# Patient Record
Sex: Male | Born: 1950
Health system: Southern US, Community
[De-identification: ages and names within clinical notes are randomized; demographics above are authoritative.]

## PROBLEM LIST (undated history)

## (undated) DIAGNOSIS — F32A Depression, unspecified: Secondary | ICD-10-CM

## (undated) DIAGNOSIS — R011 Cardiac murmur, unspecified: Secondary | ICD-10-CM

## (undated) DIAGNOSIS — I482 Chronic atrial fibrillation, unspecified: Secondary | ICD-10-CM

## (undated) DIAGNOSIS — C4491 Basal cell carcinoma of skin, unspecified: Secondary | ICD-10-CM

## (undated) DIAGNOSIS — R9431 Abnormal electrocardiogram [ECG] [EKG]: Secondary | ICD-10-CM

## (undated) DIAGNOSIS — F329 Major depressive disorder, single episode, unspecified: Secondary | ICD-10-CM

## (undated) DIAGNOSIS — I493 Ventricular premature depolarization: Secondary | ICD-10-CM

## (undated) DIAGNOSIS — I341 Nonrheumatic mitral (valve) prolapse: Secondary | ICD-10-CM

## (undated) DIAGNOSIS — I10 Essential (primary) hypertension: Secondary | ICD-10-CM

## (undated) DIAGNOSIS — D696 Thrombocytopenia, unspecified: Secondary | ICD-10-CM

## (undated) DIAGNOSIS — I340 Nonrheumatic mitral (valve) insufficiency: Secondary | ICD-10-CM

## (undated) DIAGNOSIS — N179 Acute kidney failure, unspecified: Secondary | ICD-10-CM

## (undated) DIAGNOSIS — R42 Dizziness and giddiness: Secondary | ICD-10-CM

## (undated) DIAGNOSIS — E860 Dehydration: Secondary | ICD-10-CM

## (undated) HISTORY — DX: Ventricular premature depolarization: I49.3

## (undated) HISTORY — DX: Cardiac murmur, unspecified: R01.1

## (undated) HISTORY — DX: Abnormal electrocardiogram (ECG) (EKG): R94.31

## (undated) HISTORY — DX: Nonrheumatic mitral (valve) prolapse: I34.1

## (undated) HISTORY — PX: INGUINAL HERNIA REPAIR: SUR1180

## (undated) HISTORY — DX: Nonrheumatic mitral (valve) insufficiency: I34.0

---

## 1998-05-06 ENCOUNTER — Ambulatory Visit (HOSPITAL_COMMUNITY): Admission: RE | Admit: 1998-05-06 | Discharge: 1998-05-06 | Payer: Self-pay | Admitting: Internal Medicine

## 2015-04-23 DIAGNOSIS — Z136 Encounter for screening for cardiovascular disorders: Secondary | ICD-10-CM | POA: Diagnosis not present

## 2015-04-23 DIAGNOSIS — Z23 Encounter for immunization: Secondary | ICD-10-CM | POA: Diagnosis not present

## 2015-04-23 DIAGNOSIS — Z125 Encounter for screening for malignant neoplasm of prostate: Secondary | ICD-10-CM | POA: Diagnosis not present

## 2015-04-23 DIAGNOSIS — R030 Elevated blood-pressure reading, without diagnosis of hypertension: Secondary | ICD-10-CM | POA: Diagnosis not present

## 2015-04-23 DIAGNOSIS — R319 Hematuria, unspecified: Secondary | ICD-10-CM | POA: Diagnosis not present

## 2015-04-23 DIAGNOSIS — Z0000 Encounter for general adult medical examination without abnormal findings: Secondary | ICD-10-CM | POA: Diagnosis not present

## 2015-04-23 DIAGNOSIS — Z131 Encounter for screening for diabetes mellitus: Secondary | ICD-10-CM | POA: Diagnosis not present

## 2015-04-23 DIAGNOSIS — R9431 Abnormal electrocardiogram [ECG] [EKG]: Secondary | ICD-10-CM | POA: Diagnosis not present

## 2015-05-06 ENCOUNTER — Ambulatory Visit: Payer: Self-pay | Admitting: Cardiology

## 2015-05-29 DIAGNOSIS — Z23 Encounter for immunization: Secondary | ICD-10-CM | POA: Diagnosis not present

## 2015-05-29 DIAGNOSIS — R319 Hematuria, unspecified: Secondary | ICD-10-CM | POA: Diagnosis not present

## 2015-06-10 ENCOUNTER — Encounter: Payer: Self-pay | Admitting: Cardiology

## 2015-06-10 ENCOUNTER — Ambulatory Visit (INDEPENDENT_AMBULATORY_CARE_PROVIDER_SITE_OTHER): Payer: Medicare Other | Admitting: Cardiology

## 2015-06-10 VITALS — BP 140/66 | HR 78 | Ht 73.0 in | Wt 156.8 lb

## 2015-06-10 DIAGNOSIS — I10 Essential (primary) hypertension: Secondary | ICD-10-CM | POA: Diagnosis not present

## 2015-06-10 DIAGNOSIS — E785 Hyperlipidemia, unspecified: Secondary | ICD-10-CM | POA: Diagnosis not present

## 2015-06-10 DIAGNOSIS — I340 Nonrheumatic mitral (valve) insufficiency: Secondary | ICD-10-CM

## 2015-06-10 DIAGNOSIS — I341 Nonrheumatic mitral (valve) prolapse: Secondary | ICD-10-CM

## 2015-06-10 DIAGNOSIS — I493 Ventricular premature depolarization: Secondary | ICD-10-CM

## 2015-06-10 DIAGNOSIS — R9431 Abnormal electrocardiogram [ECG] [EKG]: Secondary | ICD-10-CM

## 2015-06-10 DIAGNOSIS — I491 Atrial premature depolarization: Secondary | ICD-10-CM | POA: Diagnosis not present

## 2015-06-10 HISTORY — DX: Abnormal electrocardiogram (ECG) (EKG): R94.31

## 2015-06-10 HISTORY — DX: Nonrheumatic mitral (valve) prolapse: I34.1

## 2015-06-10 HISTORY — DX: Ventricular premature depolarization: I49.3

## 2015-06-10 LAB — CBC WITH DIFFERENTIAL/PLATELET
Basophils Absolute: 0.1 10*3/uL (ref 0.0–0.1)
Basophils Relative: 0.7 % (ref 0.0–3.0)
Eosinophils Absolute: 0.1 10*3/uL (ref 0.0–0.7)
Eosinophils Relative: 1.8 % (ref 0.0–5.0)
HCT: 39.7 % (ref 39.0–52.0)
Hemoglobin: 13.7 g/dL (ref 13.0–17.0)
Lymphocytes Relative: 19.6 % (ref 12.0–46.0)
Lymphs Abs: 1.5 10*3/uL (ref 0.7–4.0)
MCHC: 34.5 g/dL (ref 30.0–36.0)
MCV: 91.8 fl (ref 78.0–100.0)
Monocytes Absolute: 0.4 10*3/uL (ref 0.1–1.0)
Monocytes Relative: 5.2 % (ref 3.0–12.0)
Neutro Abs: 5.5 10*3/uL (ref 1.4–7.7)
Neutrophils Relative %: 72.7 % (ref 43.0–77.0)
Platelets: 91 10*3/uL — ABNORMAL LOW (ref 150.0–400.0)
RBC: 4.32 Mil/uL (ref 4.22–5.81)
RDW: 13 % (ref 11.5–15.5)
WBC: 7.6 10*3/uL (ref 4.0–10.5)

## 2015-06-10 LAB — COMPREHENSIVE METABOLIC PANEL
ALT: 9 U/L (ref 0–53)
AST: 13 U/L (ref 0–37)
Albumin: 4.6 g/dL (ref 3.5–5.2)
Alkaline Phosphatase: 59 U/L (ref 39–117)
BUN: 16 mg/dL (ref 6–23)
CO2: 32 mEq/L (ref 19–32)
Calcium: 9.4 mg/dL (ref 8.4–10.5)
Chloride: 102 mEq/L (ref 96–112)
Creatinine, Ser: 0.74 mg/dL (ref 0.40–1.50)
GFR: 112.62 mL/min (ref >60.00)
Glucose, Bld: 98 mg/dL (ref 70–99)
Potassium: 3.9 mEq/L (ref 3.5–5.1)
Sodium: 139 mEq/L (ref 135–145)
Total Bilirubin: 0.7 mg/dL (ref 0.2–1.2)
Total Protein: 7.9 g/dL (ref 6.0–8.3)

## 2015-06-10 LAB — VITAMIN D 25 HYDROXY (VIT D DEFICIENCY, FRACTURES): VITD: 17.25 ng/mL — ABNORMAL LOW (ref 30.00–100.00)

## 2015-06-10 LAB — VITAMIN B12: Vitamin B-12: 166 pg/mL — ABNORMAL LOW (ref 211–911)

## 2015-06-10 LAB — TSH: TSH: 1.44 u[IU]/mL (ref 0.35–4.50)

## 2015-06-10 LAB — MAGNESIUM: Magnesium: 2.1 mg/dL (ref 1.5–2.5)

## 2015-06-10 NOTE — Patient Instructions (Signed)
Medication Instructions:   Your physician recommends that you continue on your current medications as directed. Please refer to the Current Medication list given to you today.     Labwork:  TODAY----CMET, MAGNESIUM, CBC W DIFF, TSH, VITAMIN D, B 12    Testing/Procedures:  Your physician has requested that you have an echocardiogram. Echocardiography is a painless test that uses sound waves to create images of your heart. It provides your doctor with information about the size and shape of your heart and how well your heart's chambers and valves are working. This procedure takes approximately one hour. There are no restrictions for this procedure.   Your physician has recommended that you wear a 24 HOUR holter monitor. Holter monitors are medical devices that record the heart's electrical activity. Doctors most often use these monitors to diagnose arrhythmias. Arrhythmias are problems with the speed or rhythm of the heartbeat. The monitor is a small, portable device. You can wear one while you do your normal daily activities. This is usually used to diagnose what is causing palpitations/syncope (passing out).    Follow-Up:  2 MONTHS WITH DR Meda Coffee

## 2015-06-10 NOTE — Progress Notes (Signed)
Patient ID: Brian Martinez, male   DOB: 10-04-1950, 64 y.o.   MRN: [id number]      Cardiology Office Note  Date:  06/10/2015   ID:  Brian Martinez, DOB [birth year], MRN [id number]  PCP:  REDMON,NOELLE, PA-C  Cardiologist:  Dorothy Spark, MD   Chief Complaint  Patient presents with  . New Evaluation    abnormal ekg    History of Present Illness: Brian Martinez is a 64 y.o. male who presents for evaluation of an abnormal ECG. The patient has always been fairly healthy, his wife passed away sec to breast ca in December 2015 and he has been depressed, not active, loosing weight. He retired 5 weeks ago. He denies CP or DOE. He used to be very active. He has no children. He is planning to join [employer] and start volunteering in a hospital. He has h/o MV prolapse. No syncope, no LE edema, orthopnea, PND. He occasionally has palpitations, one time lasting 30 minutes and associated with dizziness and presyncope. No syncope. No FH of premature CAD.  Hypertension runs in his family, he has has some elevated BPs lately.  Past Medical History  Diagnosis Date  . Heart murmur   . Heart murmur     Past Surgical History  Procedure Laterality Date  . Inguinal hernia repair       Current Outpatient Prescriptions  Medication Sig Dispense Refill  . aspirin EC 81 MG tablet Take 81 mg by mouth daily.     No current facility-administered medications for this visit.    Allergies:   Review of patient's allergies indicates no known allergies.    Social History:  The patient  reports that he has never smoked. He does not have any smokeless tobacco history on file. He reports that he drinks alcohol.   Family History:  The patient's family history includes Cancer in his brother; Hypertension in his brother, brother, brother, father, and mother; Parkinsonism in his mother.    ROS:  Please see the history of present illness.   Otherwise, review of systems are positive for none.   All  other systems are reviewed and negative.    PHYSICAL EXAM: VS:  BP 140/66 mmHg  Pulse 78  Ht _0  (1.854 m)  Wt 156 lb 12.8 oz (71.124 kg)  BMI 20.69 kg/m2  SpO2 98% , BMI Body mass index is 20.69 kg/(m^2). GEN: Well nourished, well developed, in no acute distress HEENT: normal Neck: no JVD, carotid bruits, or masses Cardiac: RRR; 4/4 late systolic murmur, rubs, or gallops,no edema  Respiratory:  clear to auscultation bilaterally, normal work of breathing GI: soft, nontender, nondistended, + BS MS: no deformity or atrophy Skin: warm and dry, no rash Neuro:  Strength and sensation are intact Psych: euthymic mood, full affect  EKG:  SR, PACs, PVCs,   Recent Labs: No results found for requested labs within last 365 days.   Lipid Panel No results found for: CHOL, TRIG, HDL, CHOLHDL, VLDL, LDLCALC, LDLDIRECT   Wt Readings from Last 3 Encounters:  06/10/15 156 lb 12.8 oz (71.124 kg)      ASSESSMENT AND PLAN:  1. Mitral valve prolapse, mitral regurgitation - we will order echo to evaluate for severity.  2. Palpitations - ECG shows frequent PACs, PVCs, we will order echocardiogram to evaluate for chamber size, especially LA size and evaluate for possible atrial fibrillation. 24 Holter monitor to evaluate for ectopic burden and possible other arrhythmias.  3. Hypertension -  borderline today - at the first visit, w will follow LVH and diastolic function on the echocardiogram.  4. Hyperlipidemia - LDL 129, for now advise to do lifestyle changes, start exercising, TG 101, HDL 51.  Orders Placed This Encounter  Procedures  . Comp Met (CMET)  . Magnesium  . CBC w/Diff  . TSH  . Vitamin D (25 hydroxy)  . B12  . Holter monitor - 24 hour  . EKG 12-Lead  . Echocardiogram   Follow up in 2 months.  Signed, Dorothy Spark, MD  06/10/2015 1:55 PM    [facility] [place], [place], [place]  [address] Phone: [phone]; Fax: [phone]

## 2015-06-11 ENCOUNTER — Telehealth: Payer: Self-pay | Admitting: Cardiology

## 2015-06-11 MED ORDER — VITAMIN D3 125 MCG (5000 UT) PO CAPS: 5000.0000 [IU] | ORAL_CAPSULE | Freq: Every day | ORAL | Status: DC

## 2015-06-11 MED ORDER — VITAMIN B-12 100 MCG PO TABS: 100.0000 ug | ORAL_TABLET | Freq: Every day | ORAL | Status: AC

## 2015-06-11 NOTE — Telephone Encounter (Signed)
-----   Message from Dorothy Spark, MD sent at 06/10/2015  5:41 PM EDT ----- Normal electrolytes, liver, kidney function. Normal thyroid function. Low Vitamin D and B12 vitamin, he should start taking OTC vitamin D and B12 supplements.

## 2015-06-11 NOTE — Telephone Encounter (Signed)
Informed the pt that per Dr Meda Coffee the pts labs showed normal electrolytes, liver and function, and normal thyroid function.  Informed the pt that per Dr Meda Coffee he did have low vitamin D and B12, and he should start taking OTC Vitamin D and B12 supplements.  Clarified doses with our [place] and the pt should take Vit B12 100 mcg daily, and take Vitamin D3 5,000 Units po daily. Confirmed the pharmacy of choice with the pt. Pt verbalized understanding and agrees with this plan.

## 2015-06-11 NOTE — Telephone Encounter (Signed)
Pt calling back for results-pls call [phone]

## 2015-07-07 ENCOUNTER — Other Ambulatory Visit (HOSPITAL_COMMUNITY): Payer: Medicare Other

## 2015-07-14 ENCOUNTER — Other Ambulatory Visit (HOSPITAL_COMMUNITY): Payer: Medicare Other

## 2015-07-16 ENCOUNTER — Other Ambulatory Visit (HOSPITAL_COMMUNITY): Payer: Medicare Other

## 2015-07-29 ENCOUNTER — Other Ambulatory Visit: Payer: Self-pay | Admitting: Cardiology

## 2015-07-29 DIAGNOSIS — R9431 Abnormal electrocardiogram [ECG] [EKG]: Secondary | ICD-10-CM

## 2015-07-29 DIAGNOSIS — I491 Atrial premature depolarization: Secondary | ICD-10-CM

## 2015-07-29 DIAGNOSIS — I341 Nonrheumatic mitral (valve) prolapse: Secondary | ICD-10-CM

## 2015-07-29 DIAGNOSIS — R42 Dizziness and giddiness: Secondary | ICD-10-CM

## 2015-07-29 DIAGNOSIS — I493 Ventricular premature depolarization: Secondary | ICD-10-CM

## 2015-07-29 DIAGNOSIS — I340 Nonrheumatic mitral (valve) insufficiency: Secondary | ICD-10-CM

## 2015-07-30 ENCOUNTER — Telehealth: Payer: Self-pay | Admitting: *Deleted

## 2015-07-30 ENCOUNTER — Ambulatory Visit (INDEPENDENT_AMBULATORY_CARE_PROVIDER_SITE_OTHER): Payer: Medicare Other

## 2015-07-30 ENCOUNTER — Ambulatory Visit (HOSPITAL_COMMUNITY): Payer: Medicare Other | Attending: Cardiology

## 2015-07-30 ENCOUNTER — Other Ambulatory Visit: Payer: Self-pay

## 2015-07-30 DIAGNOSIS — I351 Nonrheumatic aortic (valve) insufficiency: Secondary | ICD-10-CM | POA: Diagnosis not present

## 2015-07-30 DIAGNOSIS — R42 Dizziness and giddiness: Secondary | ICD-10-CM

## 2015-07-30 DIAGNOSIS — I341 Nonrheumatic mitral (valve) prolapse: Secondary | ICD-10-CM

## 2015-07-30 DIAGNOSIS — I340 Nonrheumatic mitral (valve) insufficiency: Secondary | ICD-10-CM

## 2015-07-30 DIAGNOSIS — I493 Ventricular premature depolarization: Secondary | ICD-10-CM

## 2015-07-30 DIAGNOSIS — I491 Atrial premature depolarization: Secondary | ICD-10-CM

## 2015-07-30 DIAGNOSIS — R9431 Abnormal electrocardiogram [ECG] [EKG]: Secondary | ICD-10-CM

## 2015-07-30 DIAGNOSIS — I517 Cardiomegaly: Secondary | ICD-10-CM | POA: Insufficient documentation

## 2015-07-30 HISTORY — DX: Nonrheumatic mitral (valve) insufficiency: I34.0

## 2015-07-30 NOTE — Telephone Encounter (Signed)
-----   Message from Dorothy Spark, MD sent at 07/30/2015  4:32 PM EST ----- Mr Brian Martinez has moderate mitral valve prolapse with partial flail leaflet and severe mitral regurgitation.  Left atrium is severely dilated.  I would recommend that he goes to talk to CT surgery for possible repair.

## 2015-07-30 NOTE — Telephone Encounter (Signed)
Notified the pt that per Dr Meda Coffee his echo showed that he has moderate mitral valve prolapse with partial flail leaflet and severe mitral regurgitation.  Informed the pt that per Dr Meda Coffee his left atrium is severely dilated and she recommends that we refer him to [facility] for surgery for possible repair.  Informed the pt that I will send our [facility] schedulers a message to call the pt back to arrange this referral.  Pt verbalized understanding and agrees with this plan.

## 2015-08-05 ENCOUNTER — Encounter: Payer: Medicare Other | Admitting: Thoracic Surgery (Cardiothoracic Vascular Surgery)

## 2015-08-07 ENCOUNTER — Telehealth: Payer: Self-pay | Admitting: Cardiology

## 2015-08-07 NOTE — Telephone Encounter (Signed)
Returned call.  Advised that he should keep the appt with Dr Roxy Manns on 08-11-15.  He advised that he would call and reschedule with Dr Meda Coffee afterwards.

## 2015-08-07 NOTE — Telephone Encounter (Signed)
Received message from Dr. Meda Coffee that pt can be rescheduled for first available appointment with her.  I spoke with pt and appt made for him to see Dr. Meda Coffee on December 16,2016 at 2:30

## 2015-08-07 NOTE — Telephone Encounter (Signed)
New Message    Pt calling stating that he has an appt on 08/11/15 w/ Dr. Meda Coffee and Dr. Roxy Manns who Dr. Meda Coffee referred him to. Pt wants to know which appt he should keep and which one he should reschedule. Please call back and advise.

## 2015-08-11 ENCOUNTER — Ambulatory Visit: Payer: Medicare Other | Admitting: Cardiology

## 2015-08-11 ENCOUNTER — Institutional Professional Consult (permissible substitution) (INDEPENDENT_AMBULATORY_CARE_PROVIDER_SITE_OTHER): Payer: Medicare Other | Admitting: Thoracic Surgery (Cardiothoracic Vascular Surgery)

## 2015-08-11 ENCOUNTER — Encounter: Payer: Self-pay | Admitting: Thoracic Surgery (Cardiothoracic Vascular Surgery)

## 2015-08-11 VITALS — BP 156/83 | HR 82 | Resp 20 | Ht 73.0 in | Wt 158.0 lb

## 2015-08-11 DIAGNOSIS — I341 Nonrheumatic mitral (valve) prolapse: Secondary | ICD-10-CM | POA: Diagnosis not present

## 2015-08-11 DIAGNOSIS — I340 Nonrheumatic mitral (valve) insufficiency: Secondary | ICD-10-CM | POA: Diagnosis not present

## 2015-08-11 NOTE — Progress Notes (Signed)
[address]       [place] [address]             [phone]     CARDIOTHORACIC SURGERY CONSULTATION REPORT  Referring Provider is Dorothy Spark, MD PCP is REDMON,NOELLE, PA-C  Chief Complaint  Patient presents with  . Mitral Regurgitation    Surgical eval, ECHO 07/30/2015    HPI:  Patient is a 64 year old male with long-standing history of mitral valve prolapse who has been referred for surgical consultation to discuss treatment options for management of severe mitral regurgitation. The patient has remained otherwise healthy and physically active for all of his life. He states that he was first noted to have a heart murmur on physical exam was in 20 years ago. He states that he had an echocardiogram at that time and was told that he had mitral valve prolapse. For a while he was given antibiotic prophylaxis for all dental cleaning and related procedures, but several years ago his primary care physician told him that this was no longer necessary.  He was never formally evaluated by cardiologist until this past September when he was referred for elective consultation because of the presence of a loud murmur on physical exam, irregular pulse and abnormal EKG revealing sinus rhythm with PAC's and PVC's.  He was initially evaluated by Dr. Meda Coffee on 06/10/2015. A transthoracic echocardiogram was performed 07/30/2015 and notable for the presence of mitral valve prolapse with partially flail segment of the posterior leaflet and severe mitral regurgitation. Left ventricular size and systolic function remains normal with ejection fraction estimated 60-65%.  A 24 hour Holter monitor was performed demonstrating infrequent PVCs with 2 brief episodes of supraventricular tachycardia. There were no sustained arrhythmias or pauses. The patient was referred for surgical consultation.  The patient is recently widowed having lost his wife to metastatic breast cancer in December  2015. They have been married for within 25 years and never had any children. The patient currently lives alone locally in [place]. 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  The patient has 3 brothers, 2 of whom live in [place] and 1 in [place]. He is also close with his brother-in-law who lives in [place]. He does not have any family local but he has numerous friends that he remains close with. This past year has been challenging for him but he seems to be managing reasonably well and verbalizes thorough understanding of his underlying stresses. He has been physically active all of his life. He still enjoys playing golf and tennis occasionally. He reports no significant physical limitations. He specifically denies any symptoms of exertional shortness of breath or chest discomfort. He states that he had an episode of tachycardia palpitation with dizziness one year ago when his wife was ill. He has occasional palpitations but no dizzy spells recently. He denies any history of PND, orthopnea, or lower extremity edema. Energy level has been down some, but he has attributed this to depression and the stresses associated with losing his wife.  Past Medical History  Diagnosis Date  . Heart murmur   . Severe mitral regurgitation 07/30/2015  . MVP (mitral valve prolapse) 06/10/2015  . Abnormal EKG 06/10/2015  . PVC (premature ventricular contraction) 06/10/2015    Past Surgical History  Procedure Laterality Date  . Inguinal hernia repair  Family History  Problem Relation Age of Onset  . Hypertension Father   . Cancer Brother   . Hypertension Brother   . Hypertension Mother   . Parkinsonism Mother   . Hypertension Brother   . Hypertension Brother     Social History   Social History  . Marital Status: Married    Spouse Name: N/A  . Number of Children:  N/A  . Years of Education: N/A   Occupational History  . Not on file.   Social History Main Topics  . Smoking status: Never Smoker   . Smokeless tobacco: Not on file  . Alcohol Use: Yes  . Drug Use: Not on file  . Sexual Activity: Not on file   Other Topics Concern  . Not on file   Social History Narrative    Current Outpatient Prescriptions  Medication Sig Dispense Refill  . aspirin EC 81 MG tablet Take 81 mg by mouth daily.    . Cholecalciferol (VITAMIN D3) 5000 UNITS CAPS Take 1 capsule (5,000 Units total) by mouth daily. 30 capsule 3  . vitamin B-12 (CYANOCOBALAMIN) 100 MCG tablet Take 1 tablet (100 mcg total) by mouth daily. 90 tablet 3   No current facility-administered medications for this visit.    No Known Allergies    Review of Systems:   General:  normal appetite, decreased energy, no weight gain, no weight loss, no fever  Cardiac:  no chest pain with exertion, no chest pain at rest, no SOB with exertion, no resting SOB, no PND, no orthopnea, = palpitations, NO arrhythmia, NO atrial fibrillation, NO LE edema, NO dizzy spells, NO syncope  Respiratory:  no shortness of breath, no home oxygen, no productive cough, no dry cough, no bronchitis, no wheezing, no hemoptysis, no asthma, no pain with inspiration or cough, no sleep apnea, no CPAP at night  GI:   no difficulty swallowing, no reflux, no frequent heartburn, no hiatal hernia, no abdominal pain, no constipation, no diarrhea, no hematochezia, no hematemesis, no melena  GU:   no dysuria,  no frequency, no urinary tract infection, + hematuria, NO enlarged prostate, no kidney stones, no kidney disease  Vascular:  no pain suggestive of claudication, no pain in feet, no leg cramps, no varicose veins, no DVT, no non-healing foot ulcer  Neuro:   no stroke, no TIA's, no seizures, no headaches, no temporary blindness one eye,  no slurred speech, no peripheral neuropathy, no chronic pain, no instability of gait, no  memory/cognitive dysfunction  Musculoskeletal: no arthritis, no joint swelling, no myalgias, no difficulty walking, normal mobility   Skin:   no rash, no itching, no skin infections, no pressure sores or ulcerations  Psych:   no anxiety, + depression, no nervousness, + unusual recent stress  Eyes:   no blurry vision, no floaters, no recent vision changes, + wears glasses or contacts  ENT:   no hearing loss, no loose or painful teeth, no dentures, last saw dentist within the past 12 months  Hematologic:  no easy bruising, no abnormal bleeding, no clotting disorder, no frequent epistaxis  Endocrine:  no diabetes, does not check CBG's at home     Physical Exam:   BP 156/83 mmHg  Pulse 82  Resp 20  Ht 6\' 1"  (1.854 m)  Wt 158 lb (71.668 kg)  BMI 20.85 kg/m2  SpO2 98%  General:  Thin,  well-appearing  HEENT:  Unremarkable   Neck:   no JVD, no bruits, no adenopathy   Chest:  clear to auscultation, symmetrical breath sounds, no wheezes, no rhonchi   CV:   RRR, grade IV/VI holosystolic murmur   Abdomen:  soft, non-tender, no masses   Extremities:  warm, well-perfused, pulses palpable, no LE edema  Rectal/GU  Deferred  Neuro:   Grossly non-focal and symmetrical throughout  Skin:   Clean and dry, no rashes, no breakdown   Diagnostic Tests:  Transthoracic Echocardiography  Patient:  Vasco, Bunker MR #:    [id number] Study Date: 07/30/2015 Gender:   M Age:    57 Height:   185.4 cm Weight:   70.8 kg BSA:    1.9 m^2 Pt. Status: Room:  SONOGRAPHER Marygrace Drought, RCS ATTENDING  Ena Dawley, M.D. ORDERING   Ena Dawley, M.D. REFERRING  Ena Dawley, M.D. PERFORMING  [facility], Outpatient  cc:  ------------------------------------------------------------------- LV EF: 60% -  65%  ------------------------------------------------------------------- Indications:   Abnormal EKG  (R94.31).  ------------------------------------------------------------------- Study Conclusions  - Left ventricle: There was moderate concentric hypertrophy. Systolic function was normal. The estimated ejection fraction was in the range of 60% to 65%. The study is not technically sufficient to allow evaluation of LV diastolic function. - Aortic valve: There was trivial regurgitation. - Mitral valve: Moderate prolapse and partially flail leaflet involving the posterior leaflet. There was severe regurgitation directed anteriorly. - Left atrium: The atrium was moderately to severely dilated.  Transthoracic echocardiography. M-mode, complete 2D, spectral Doppler, and color Doppler. Birthdate: Patient birthdate: 1951-02-12. Age: Patient is 64 yr old. Sex: Gender: male. BMI: 20.6 kg/m^2. Blood pressure:   140/66 Patient status: Outpatient. Study date: Study date: 07/30/2015. Study time: 02:08 PM. Location: Echo laboratory.  -------------------------------------------------------------------  ------------------------------------------------------------------- Left ventricle: There was moderate concentric hypertrophy. Systolic function was normal. The estimated ejection fraction was in the range of 60% to 65%. The study is not technically sufficient to allow evaluation of LV diastolic function.  ------------------------------------------------------------------- Aortic valve:  Structurally normal valve.  Cusp separation was normal. Doppler: Transvalvular velocity was within the normal range. There was no stenosis. There was trivial regurgitation.  ------------------------------------------------------------------- Aorta: The aorta was normal, not dilated, and non-diseased.  ------------------------------------------------------------------- Mitral valve: Moderate prolapse and partially flail leaflet involving the posterior leaflet. Doppler: There was  severe regurgitation directed anteriorly.  ------------------------------------------------------------------- Left atrium: The atrium was moderately to severely dilated.  ------------------------------------------------------------------- Right ventricle: The cavity size was normal. Wall thickness was normal. Systolic function was normal.  ------------------------------------------------------------------- Pulmonic valve:  Structurally normal valve.  Cusp separation was normal. Doppler: Transvalvular velocity was within the normal range. There was trivial regurgitation.  ------------------------------------------------------------------- Tricuspid valve:  Structurally normal valve.  Leaflet separation was normal. Doppler: Transvalvular velocity was within the normal range. There was mild regurgitation.  ------------------------------------------------------------------- Pulmonary artery:  Poorly visualized. The main pulmonary artery was normal-sized.  ------------------------------------------------------------------- Right atrium: The atrium was normal in size.  ------------------------------------------------------------------- Pericardium: The pericardium was normal in appearance. There was no pericardial effusion.  ------------------------------------------------------------------- Systemic veins: Inferior vena cava: The vessel was normal in size. The respirophasic diameter changes were in the normal range (= 50%), consistent with normal central venous pressure. Diameter: 19 mm.  ------------------------------------------------------------------- Post procedure conclusions Ascending Aorta:  - The aorta was normal, not dilated, and non-diseased.  ------------------------------------------------------------------- Measurements  IVC                     Value    Reference ID  19  mm    ---------  Left ventricle               Value    Reference LV ID, ED, PLAX chordal       (H)   57.2 mm   43 - 52 LV ID, ES, PLAX chordal           36.6 mm   23 - 38 LV fx shortening, PLAX chordal       36  %   >=29 LV PW thickness, ED             13.4 mm   --------- IVS/LV PW ratio, ED             1.16     <=1.3 Stroke volume, 2D              73  ml   --------- Stroke volume/bsa, 2D            38  ml/m^2 --------- LV ejection fraction, 1-p A4C        66  %   ---------  Ventricular septum             Value    Reference IVS thickness, ED              15.5 mm   ---------  LVOT                    Value    Reference LVOT ID, S                 21  mm   --------- LVOT area                  3.46 cm^2  --------- LVOT peak velocity, S            101  cm/s  --------- LVOT mean velocity, S            73.9 cm/s  --------- LVOT VTI, S                 21.1 cm   --------- LVOT peak gradient, S            4   mm Hg ---------  Aortic valve                Value    Reference Aortic regurg pressure half-time      329  ms   ---------  Aorta                    Value    Reference Aortic root ID, ED             34  mm   ---------  Left atrium                 Value    Reference LA ID, A-P, ES               51  mm   --------- LA ID/bsa, A-P           (H)   2.68 cm/m^2 <=2.2 LA volume, S                106  ml   --------- LA volume/bsa, S              55.8 ml/m^2 --------- LA volume, ES, 1-p A4C  108  ml   --------- LA  volume/bsa, ES, 1-p A4C         56.8 ml/m^2 --------- LA volume, ES, 1-p A2C           95  ml   --------- LA volume/bsa, ES, 1-p A2C         50  ml/m^2 ---------  Pulmonary arteries             Value    Reference PA pressure, S, DP             29  mm Hg <=30  Tricuspid valve               Value    Reference Tricuspid regurg peak velocity       254  cm/s  --------- Tricuspid peak RV-RA gradient        26  mm Hg --------- Tricuspid maximal regurg velocity,     254  cm/s  --------- PISA  Systemic veins               Value    Reference Estimated CVP                3   mm Hg ---------  Right ventricle               Value    Reference RV pressure, S, DP             29  mm Hg <=30 RV s&', lateral, S              14.4 cm/s  ---------  Legend: (L) and (H) mark values outside specified reference range.  ------------------------------------------------------------------- Prepared and Electronically Authenticated by  Candee Furbish, M.D. 2016-11-10T15:47:01   Impression:  Patient has stage C severe asymptomatic primary mitral regurgitation. I have personally reviewed the patient's recent transthoracic echocardiogram. Image quality is good. The patient has an obvious flail segment of the posterior leaflet of the mitral valve with severe (4+) mitral regurgitation. Left ventricular size and systolic function remain normal. There are no other complicating features.  Based upon review of the patient's echocardiogram I feel there is a very high likelihood that his valve should be repairable with anticipation of excellent long-term durable result and very low operative risk. In the absence of significant coronary artery disease he would likely be an excellent candidate for minimally  invasive approach for surgery.   Plan:  The patient was counseled at length regarding the indications, risks and potential benefits of mitral valve repair.  The rationale for elective surgery has been explained, including a comparison between surgery and continued medical therapy with close follow-up.  The likelihood of successful and durable valve repair has been discussed with particular reference to the findings of their recent echocardiogram.  Based upon these findings and previous experience, I have quoted them a greater than 95 percent likelihood of successful valve repair.  Alternative surgical approaches have been discussed including a comparison between conventional sternotomy and minimally-invasive techniques.  Expectations for the patient's postoperative convalescence has been discussed. At this point the patient wants to think over the options further and wait until after the holidays have passed before making a decision about proceeding with elective surgery. Under the circumstances this seems very reasonable. I stressed the importance that he will need to be followed carefully if he decides to hold off on elective surgical intervention for an extended period of time. Once the patient has made his decision  to proceed with surgery he will need to undergo diagnostic cardiac catheterization to rule out the presence of significant artery disease. I have offered to schedule the patient for routine follow-up appointment in 6 months or sooner should he decide that he wishes to proceed with surgery in the near future. All of his questions have been addressed.   I spent in excess of 90 minutes during the conduct of this office consultation and >50% of this time involved direct face-to-face encounter with the patient for counseling and/or coordination of their care.   Valentina Gu. Roxy Manns, MD 08/11/2015 3:51 PM

## 2015-08-11 NOTE — Patient Instructions (Signed)
Be mindful of the development of any symptoms of shortness of breath and/or chest discomfort with exertion and report them to your cardiologist

## 2015-08-12 ENCOUNTER — Encounter: Payer: Medicare Other | Admitting: Thoracic Surgery (Cardiothoracic Vascular Surgery)

## 2015-09-04 ENCOUNTER — Encounter: Payer: Self-pay | Admitting: Cardiology

## 2015-09-04 ENCOUNTER — Ambulatory Visit (INDEPENDENT_AMBULATORY_CARE_PROVIDER_SITE_OTHER): Payer: Medicare Other | Admitting: Cardiology

## 2015-09-04 VITALS — BP 138/82 | HR 92 | Ht 73.0 in | Wt 157.0 lb

## 2015-09-04 DIAGNOSIS — R002 Palpitations: Secondary | ICD-10-CM | POA: Diagnosis not present

## 2015-09-04 DIAGNOSIS — E785 Hyperlipidemia, unspecified: Secondary | ICD-10-CM | POA: Diagnosis not present

## 2015-09-04 DIAGNOSIS — I340 Nonrheumatic mitral (valve) insufficiency: Secondary | ICD-10-CM | POA: Diagnosis not present

## 2015-09-04 DIAGNOSIS — I119 Hypertensive heart disease without heart failure: Secondary | ICD-10-CM | POA: Diagnosis not present

## 2015-09-04 MED ORDER — LOSARTAN POTASSIUM 25 MG PO TABS: 25.0000 mg | ORAL_TABLET | Freq: Every day | ORAL | Status: DC

## 2015-09-04 NOTE — Progress Notes (Signed)
Patient ID: Brian Martinez, male   DOB: 06-04-1951, 64 y.o.   MRN: [id number]      Cardiology Office Note  Date:  09/04/2015   ID:  Brian Martinez, DOB [birth year], MRN [id number]  PCP:  REDMON,NOELLE, PA-C  Cardiologist:  Dorothy Spark, MD   No chief complaint on file.   History of Present Illness: Brian Martinez is a 64 y.o. male who presents for evaluation of an abnormal ECG. The patient has always been fairly healthy, his wife passed away sec to breast ca in December 2015 and he has been depressed, not active, loosing weight. He retired 5 weeks ago. He denies CP or DOE. He used to be very active. He has no children. He is planning to join [employer] and start volunteering in a hospital. He has h/o MV prolapse. No syncope, no LE edema, orthopnea, PND. He occasionally has palpitations, one time lasting 30 minutes and associated with dizziness and presyncope. No syncope. No FH of premature CAD.  Hypertension runs in his family, he has has some elevated BPs lately.  09/04/15 - the patient underwent echocardiography that showed moderate LVH, Moderate prolapse and partially flail leaflet involving the posterior leaflet with severe regurgitation directed anteriorly, moderate to severe left atrial dilatation. He was evaluated by Dr Roxy Manns who suggested minimally invasive MV repair. The patient wants to determine the best timing in order to be able to attend his nephew's wedding. He denies any SOB, CP, palpitations, he exercises and has minimal DOE. No syncope.  Past Medical History  Diagnosis Date  . Heart murmur   . Severe mitral regurgitation 07/30/2015  . MVP (mitral valve prolapse) 06/10/2015  . Abnormal EKG 06/10/2015  . PVC (premature ventricular contraction) 06/10/2015    Past Surgical History  Procedure Laterality Date  . Inguinal hernia repair       Current Outpatient Prescriptions  Medication Sig Dispense Refill  . aspirin EC 81 MG tablet Take 81 mg by mouth daily.     . Cholecalciferol (VITAMIN D3) 5000 UNITS CAPS Take 1 capsule (5,000 Units total) by mouth daily. 30 capsule 3  . vitamin B-12 (CYANOCOBALAMIN) 100 MCG tablet Take 1 tablet (100 mcg total) by mouth daily. 90 tablet 3   No current facility-administered medications for this visit.    Allergies:   Review of patient's allergies indicates no known allergies.    Social History:  The patient  reports that he has never smoked. He does not have any smokeless tobacco history on file. He reports that he drinks alcohol.   Family History:  The patient's family history includes Cancer in his brother; Hypertension in his brother, brother, brother, father, and mother; Parkinsonism in his mother.    ROS:  Please see the history of present illness.   Otherwise, review of systems are positive for none.   All other systems are reviewed and negative.    PHYSICAL EXAM: VS:  There were no vitals taken for this visit. , BMI There is no weight on file to calculate BMI. GEN: Well nourished, well developed, in no acute distress HEENT: normal Neck: no JVD, carotid bruits, or masses Cardiac: RRR; 4/4 late systolic murmur, rubs, or gallops,no edema  Respiratory:  clear to auscultation bilaterally, normal work of breathing GI: soft, nontender, nondistended, + BS MS: no deformity or atrophy Skin: warm and dry, no rash Neuro:  Strength and sensation are intact Psych: euthymic mood, full affect  EKG:  SR, PACs, PVCs,   Recent  Labs: 06/10/2015: ALT 9; BUN 16; Creatinine, Ser 0.74; Hemoglobin 13.7; Magnesium 2.1; Platelets 91.0*; Potassium 3.9; Sodium 139; TSH 1.44   Lipid Panel No results found for: CHOL, TRIG, HDL, CHOLHDL, VLDL, LDLCALC, LDLDIRECT   Wt Readings from Last 3 Encounters:  08/11/15 158 lb (71.668 kg)  06/10/15 156 lb 12.8 oz (71.124 kg)   : TTE: 07/30/2015 Left ventricle: There was moderate concentric hypertrophy. Systolic function was normal. The estimated ejection fraction was in  the range of 60% to 65%. The study is not technically sufficient to allow evaluation of LV diastolic function. - Aortic valve: There was trivial regurgitation. - Mitral valve: Moderate prolapse and partially flail leaflet involving the posterior leaflet. There was severe regurgitation directed anteriorly. - Left atrium: The atrium was moderately to severely dilated.     ASSESSMENT AND PLAN:  1. 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  2. Hypertension - with moderate LVH, and severe MR, we need to do aggressive BP control, he is reluctant, I will prescribe losartan 25 mg po daily, BMP in 1 month.  3. Palpitations - ECG shows frequent PACs, PVCs, there is evere LA dilatation on echo, he is a high risk for developing atrial fibrillation.  24 Holter monitor to evaluate for ectopic burden and possible other arrhythmias.  Infrequent PVCs with no runs.  Two very short episodes of SVT, the longest lasting 7 beats. Benign Holter monitor. No significant arrhythmias or pauses.   4. Hyperlipidemia - LDL 129, for now advise to do lifestyle changes, start exercising, TG 101, HDL 51. Advised to consider red yeast rice.  Follow up in 3 months.  Signed, Dorothy Spark, MD  09/04/2015 2:42 PM    [facility] [address], [place], [place]  [address] Phone: [phone]; Fax: [phone]

## 2015-09-04 NOTE — Patient Instructions (Signed)
Medication Instructions:   START TAKING LOSARTAN 25 MG ONCE DAILY   Labwork:  ONE MONTH TO CHECK A ----BMET   Follow-Up:  3 MONTHS WITH DR Meda Coffee    If you need a refill on your cardiac medications before your next appointment, please call your pharmacy.

## 2015-10-05 ENCOUNTER — Other Ambulatory Visit: Payer: Medicare Other

## 2015-11-17 DIAGNOSIS — L821 Other seborrheic keratosis: Secondary | ICD-10-CM | POA: Diagnosis not present

## 2015-11-17 DIAGNOSIS — L570 Actinic keratosis: Secondary | ICD-10-CM | POA: Diagnosis not present

## 2015-12-03 ENCOUNTER — Inpatient Hospital Stay (HOSPITAL_COMMUNITY)
Admission: EM | Admit: 2015-12-03 | Discharge: 2015-12-10 | DRG: 308 | Disposition: A | Discharge status: Home / Self Care | Payer: Medicare Other | Attending: Internal Medicine | Admitting: Internal Medicine

## 2015-12-03 ENCOUNTER — Emergency Department (HOSPITAL_COMMUNITY): Payer: Medicare Other

## 2015-12-03 ENCOUNTER — Encounter (HOSPITAL_COMMUNITY): Payer: Self-pay

## 2015-12-03 DIAGNOSIS — I4891 Unspecified atrial fibrillation: Secondary | ICD-10-CM | POA: Diagnosis not present

## 2015-12-03 DIAGNOSIS — D473 Essential (hemorrhagic) thrombocythemia: Secondary | ICD-10-CM | POA: Diagnosis not present

## 2015-12-03 DIAGNOSIS — Z7901 Long term (current) use of anticoagulants: Secondary | ICD-10-CM

## 2015-12-03 DIAGNOSIS — E785 Hyperlipidemia, unspecified: Secondary | ICD-10-CM | POA: Diagnosis present

## 2015-12-03 DIAGNOSIS — I493 Ventricular premature depolarization: Secondary | ICD-10-CM | POA: Diagnosis present

## 2015-12-03 DIAGNOSIS — R0789 Other chest pain: Secondary | ICD-10-CM | POA: Diagnosis not present

## 2015-12-03 DIAGNOSIS — Z8249 Family history of ischemic heart disease and other diseases of the circulatory system: Secondary | ICD-10-CM | POA: Diagnosis not present

## 2015-12-03 DIAGNOSIS — I472 Ventricular tachycardia: Secondary | ICD-10-CM | POA: Diagnosis present

## 2015-12-03 DIAGNOSIS — J154 Pneumonia due to other streptococci: Secondary | ICD-10-CM | POA: Diagnosis present

## 2015-12-03 DIAGNOSIS — I119 Hypertensive heart disease without heart failure: Secondary | ICD-10-CM | POA: Diagnosis present

## 2015-12-03 DIAGNOSIS — I340 Nonrheumatic mitral (valve) insufficiency: Secondary | ICD-10-CM | POA: Diagnosis present

## 2015-12-03 DIAGNOSIS — E876 Hypokalemia: Secondary | ICD-10-CM | POA: Diagnosis present

## 2015-12-03 DIAGNOSIS — N179 Acute kidney failure, unspecified: Secondary | ICD-10-CM | POA: Diagnosis not present

## 2015-12-03 DIAGNOSIS — J189 Pneumonia, unspecified organism: Secondary | ICD-10-CM | POA: Diagnosis not present

## 2015-12-03 DIAGNOSIS — I341 Nonrheumatic mitral (valve) prolapse: Secondary | ICD-10-CM | POA: Diagnosis not present

## 2015-12-03 DIAGNOSIS — D7589 Other specified diseases of blood and blood-forming organs: Secondary | ICD-10-CM | POA: Diagnosis present

## 2015-12-03 DIAGNOSIS — R002 Palpitations: Secondary | ICD-10-CM | POA: Diagnosis not present

## 2015-12-03 DIAGNOSIS — I10 Essential (primary) hypertension: Secondary | ICD-10-CM | POA: Insufficient documentation

## 2015-12-03 DIAGNOSIS — D75839 Thrombocytosis, unspecified: Secondary | ICD-10-CM | POA: Diagnosis present

## 2015-12-03 DIAGNOSIS — I511 Rupture of chordae tendineae, not elsewhere classified: Secondary | ICD-10-CM | POA: Diagnosis present

## 2015-12-03 DIAGNOSIS — Z7982 Long term (current) use of aspirin: Secondary | ICD-10-CM

## 2015-12-03 DIAGNOSIS — R918 Other nonspecific abnormal finding of lung field: Secondary | ICD-10-CM | POA: Diagnosis not present

## 2015-12-03 LAB — CBC
HEMATOCRIT: 40 % (ref 39.0–52.0)
HEMOGLOBIN: 13.8 g/dL (ref 13.0–17.0)
MCH: 32 pg (ref 26.0–34.0)
MCHC: 34.5 g/dL (ref 30.0–36.0)
MCV: 92.8 fL (ref 78.0–100.0)
Platelets: 466 10*3/uL — ABNORMAL HIGH (ref 150–400)
RBC: 4.31 MIL/uL (ref 4.22–5.81)
RDW: 12.4 % (ref 11.5–15.5)
WBC: 21.1 10*3/uL — AB (ref 4.0–10.5)

## 2015-12-03 LAB — CBG MONITORING, ED: Glucose-Capillary: 125 mg/dL — ABNORMAL HIGH (ref 65–99)

## 2015-12-03 LAB — URINALYSIS, ROUTINE W REFLEX MICROSCOPIC
Bilirubin Urine: NEGATIVE
Glucose, UA: NEGATIVE mg/dL
KETONES UR: NEGATIVE mg/dL
NITRITE: NEGATIVE
PH: 5.5 (ref 5.0–8.0)
PROTEIN: 30 mg/dL — AB
Specific Gravity, Urine: 1.019 (ref 1.005–1.030)

## 2015-12-03 LAB — URINE MICROSCOPIC-ADD ON

## 2015-12-03 LAB — BASIC METABOLIC PANEL
ANION GAP: 14 (ref 5–15)
BUN: 70 mg/dL — AB (ref 6–20)
CO2: 26 mmol/L (ref 22–32)
Calcium: 9.1 mg/dL (ref 8.9–10.3)
Chloride: 99 mmol/L — ABNORMAL LOW (ref 101–111)
Creatinine, Ser: 1.43 mg/dL — ABNORMAL HIGH (ref 0.61–1.24)
GFR, EST AFRICAN AMERICAN: 57 mL/min — AB (ref >60)
GFR, EST NON AFRICAN AMERICAN: 50 mL/min — AB (ref >60)
Glucose, Bld: 141 mg/dL — ABNORMAL HIGH (ref 65–99)
POTASSIUM: 3.7 mmol/L (ref 3.5–5.1)
SODIUM: 139 mmol/L (ref 135–145)

## 2015-12-03 LAB — I-STAT TROPONIN, ED: TROPONIN I, POC: 0.04 ng/mL (ref 0.00–0.08)

## 2015-12-03 MED ORDER — ACETAMINOPHEN 325 MG PO TABS
650.0000 mg | ORAL_TABLET | ORAL | Status: DC | PRN
Start: 2015-12-03 — End: 2015-12-10

## 2015-12-03 MED ORDER — AZITHROMYCIN 250 MG PO TABS
250.0000 mg | ORAL_TABLET | Freq: Every day | ORAL | Status: AC
  Administered 2015-12-04 – 2015-12-07 (×4): 250 mg via ORAL
  Filled 2015-12-03 (×5): qty 1

## 2015-12-03 MED ORDER — SODIUM CHLORIDE 0.9 % IV BOLUS (SEPSIS)
1000.0000 mL | Freq: Once | INTRAVENOUS | Status: AC
  Administered 2015-12-03: 1000 mL via INTRAVENOUS

## 2015-12-03 MED ORDER — METOPROLOL TARTRATE 1 MG/ML IV SOLN
5.0000 mg | Freq: Once | INTRAVENOUS | Status: AC
  Administered 2015-12-03: 5 mg via INTRAVENOUS
  Filled 2015-12-03: qty 5

## 2015-12-03 MED ORDER — SODIUM CHLORIDE 0.9 % IV SOLN
INTRAVENOUS | Status: DC
  Administered 2015-12-03: 17:00:00 via INTRAVENOUS

## 2015-12-03 MED ORDER — SODIUM CHLORIDE 0.9 % IV BOLUS (SEPSIS)
500.0000 mL | Freq: Once | INTRAVENOUS | Status: AC
  Administered 2015-12-03: 500 mL via INTRAVENOUS

## 2015-12-03 MED ORDER — DEXTROMETHORPHAN POLISTIREX ER 30 MG/5ML PO SUER
30.0000 mg | ORAL | Status: DC | PRN
Start: 2015-12-03 — End: 2015-12-10
  Administered 2015-12-04: 30 mg via ORAL
  Filled 2015-12-03 (×2): qty 5

## 2015-12-03 MED ORDER — DILTIAZEM LOAD VIA INFUSION
10.0000 mg | Freq: Once | INTRAVENOUS | Status: AC
  Administered 2015-12-03: 10 mg via INTRAVENOUS
  Filled 2015-12-03: qty 10

## 2015-12-03 MED ORDER — ASPIRIN EC 81 MG PO TBEC
81.0000 mg | DELAYED_RELEASE_TABLET | Freq: Every day | ORAL | Status: DC
  Administered 2015-12-04 – 2015-12-07 (×4): 81 mg via ORAL
  Filled 2015-12-03 (×4): qty 1

## 2015-12-03 MED ORDER — DEXTROSE 5 % IV SOLN
1.0000 g | Freq: Once | INTRAVENOUS | Status: AC
  Administered 2015-12-03: 1 g via INTRAVENOUS
  Filled 2015-12-03: qty 10

## 2015-12-03 MED ORDER — DEXTROSE 5 % IV SOLN
1.0000 g | INTRAVENOUS | Status: DC
  Administered 2015-12-04 – 2015-12-07 (×4): 1 g via INTRAVENOUS
  Filled 2015-12-03 (×4): qty 10

## 2015-12-03 MED ORDER — VITAMIN B-12 100 MCG PO TABS
100.0000 ug | ORAL_TABLET | Freq: Every day | ORAL | Status: DC
  Administered 2015-12-04 – 2015-12-10 (×7): 100 ug via ORAL
  Filled 2015-12-03 (×8): qty 1

## 2015-12-03 MED ORDER — DILTIAZEM HCL 100 MG IV SOLR
5.0000 mg/h | INTRAVENOUS | Status: DC
  Administered 2015-12-03: 10 mg/h via INTRAVENOUS
  Administered 2015-12-03: 5 mg/h via INTRAVENOUS
  Administered 2015-12-03: 12.5 mg/h via INTRAVENOUS
  Administered 2015-12-04 – 2015-12-05 (×5): 15 mg/h via INTRAVENOUS
  Filled 2015-12-03 (×6): qty 100

## 2015-12-03 MED ORDER — AZITHROMYCIN 250 MG PO TABS
500.0000 mg | ORAL_TABLET | Freq: Every day | ORAL | Status: AC
  Administered 2015-12-03: 500 mg via ORAL
  Filled 2015-12-03: qty 2

## 2015-12-03 MED ORDER — DILTIAZEM LOAD VIA INFUSION
15.0000 mg | Freq: Once | INTRAVENOUS | Status: DC | PRN
  Filled 2015-12-03: qty 15

## 2015-12-03 MED ORDER — ONDANSETRON HCL 4 MG/2ML IJ SOLN: 4.0000 mg | Freq: Four times a day (QID) | INTRAMUSCULAR | Status: DC | PRN

## 2015-12-03 MED ORDER — VITAMIN D 1000 UNITS PO TABS
5000.0000 [IU] | ORAL_TABLET | Freq: Every day | ORAL | Status: DC
  Administered 2015-12-04: 5000 [IU] via ORAL
  Administered 2015-12-05 – 2015-12-06 (×2): 2000 [IU] via ORAL
  Administered 2015-12-08 – 2015-12-10 (×3): 5000 [IU] via ORAL
  Filled 2015-12-03 (×7): qty 5

## 2015-12-03 NOTE — H&P (Signed)
[facility] History and Physical  Brian Martinez [id number] DOB: October 20, 1950 DOA: 12/03/2015  Referring physician: ED physician PCP: Dorothy Spark, MD  Specialists: Dr. Meda Coffee (cardiology), Dr. Roxy Manns (CT surgery)   Chief Complaint:  Heart racing, dyspnea, presyncope   HPI: Brian Martinez is a 65 y.o. male with PMH of hypertension and mitral valve prolapse with flail leaflet and severe mitral regurgitation currently under evaluation by CT surgery and now presents to the ED with persistent palpitations and dyspnea with minimal exertion. Patient reports first noticing palpitations approximately one year ago, but describes this in the setting of severe anxiety related to the passing of his wife last year. He went on to have recurrent, but typically brief episodes of palpitations since that time. While undergoing cardiac workup, TTE revealed a partial flail leaflet with severe mitral regurgitation and he was referred to cardiothoracic surgery for evaluation. Echo also revealed severe dilation of the left atrium, raising concern for development of atrial fibrillation. He wore a Holter monitor for 24 hours and per cardiology notes, there were infrequent PVCs and 2 very short bursts of SVT, the longer of which lasted only 7 beats. Patient was started on losartan for elevated blood pressures and has continued to take a daily aspirin 81 mg. This morning, patient experienced a particularly severe episode of palpitations that persisted throughout the day, ultimately prompting his presentation to the ED. He denies any frank chest pain but does note some episodes of flushing and presyncope that coincide with the palpitations. There is been no lower extremity edema, hemoptysis, or long distance travel. There is no personal or family history of pulmonary embolism and the patient does not smoke or have known cancer.   In ED, patient was found to be afebrile, saturating well on room air, tachycardic to the  160s, and with blood pressure in the 110/80 range. EKG demonstrated atrial fibrillation with rapid ventricular response and LVH by voltage criteria. Chest x-ray features a right lower lobe infiltrate consistent with pneumonia. Blood work is notable for leukocytosis to 21,100 and thrombocytosis of 466,000. Chemistry panel reveals a serum creatinine of 1.43, up from an apparent baseline of 0.7. A 1.5 L bolus of normal saline was  givenin the ED and empiric azithromycin and Rocephin were administered.  There is no change in heart rate after a 5 mg IV push and metoprolol and so Cardizem bolus was administered, followed by infusion. Blood pressure remained stable and heart rate improved only minimally on the diltiazem infusion despite running at a rate of 15 mL/hr. A second bolus was given with diltiazem 15 mg. Patient will be admitted to the stepdown unit for ongoing evaluation and management of new-onset atrial fibrillation with RVR in addition to community-acquired pneumonia.  Where does patient live?   At home    Can patient participate in ADLs?  Yes        Review of Systems:   General: no fevers, chills, sweats, weight change, poor appetite, or fatigue HEENT: no blurry vision, hearing changes or sore throat Pulm: no cough or wheeze. Exertional dyspnea  CV: no chest pain. Palpitations Abd: no nausea, vomiting, abdominal pain, diarrhea, or constipation GU: no dysuria, hematuria, increased urinary frequency, or urgency  Ext: no leg edema Neuro: no focal weakness, numbness, or tingling, no vision change or hearing loss Skin: no rash, no wounds MSK: No muscle spasm, no deformity, no red, hot, or swollen joint Heme: No easy bruising or bleeding Travel history: No recent long distant  travel    Allergy: No Known Allergies  Past Medical History  Diagnosis Date  . Heart murmur   . Severe mitral regurgitation 07/30/2015  . MVP (mitral valve prolapse) 06/10/2015  . Abnormal EKG 06/10/2015  . PVC  (premature ventricular contraction) 06/10/2015    Past Surgical History  Procedure Laterality Date  . Inguinal hernia repair      Social History:  reports that he has never smoked. He does not have any smokeless tobacco history on file. He reports that he drinks alcohol. His drug history is not on file.  Family History:  Family History  Problem Relation Age of Onset  . Hypertension Father   . Cancer Brother   . Hypertension Brother   . Hypertension Mother   . Parkinsonism Mother   . Hypertension Brother   . Hypertension Brother      Prior to Admission medications   Medication Sig Start Date End Date Taking? Authorizing Provider  aspirin EC 81 MG tablet Take 81 mg by mouth daily.   Yes Historical Provider, MD  Cholecalciferol (VITAMIN D3) 5000 UNITS CAPS Take 1 capsule (5,000 Units total) by mouth daily. 06/11/15  Yes Dorothy Spark, MD  dextromethorphan (DELSYM) 30 MG/5ML liquid Take 30 mg by mouth as needed for cough.   Yes Historical Provider, MD  vitamin B-12 (CYANOCOBALAMIN) 100 MCG tablet Take 1 tablet (100 mcg total) by mouth daily. 06/11/15  Yes Dorothy Spark, MD  losartan (COZAAR) 25 MG tablet Take 1 tablet (25 mg total) by mouth daily. Patient not taking: Reported on 12/03/2015 09/04/15   Dorothy Spark, MD    Physical Exam: Filed Vitals:   12/03/15 2148 12/03/15 2219 12/03/15 2230 12/03/15 2300  BP: 110/76 113/83 116/89 111/86  Pulse: 131 115 123 41  Temp:      TempSrc:      Resp: 22 22 24 24   SpO2: 96% 96% 97% 99%   General: Not in acute distress HEENT:       Eyes: PERRL, EOMI, no scleral icterus or conjunctival pallor.       ENT: No discharge from the ears or nose, no pharyngeal ulcers, petechiae or exudate, no tonsillar enlargement.        Neck: No JVD, no bruit, no appreciable mass Heme: No cervical adenopathy, no pallor Cardiac: Rate ~120 and irregular, grade IV holosystolic murmur loudest at apex, No gallops or rubs. Pulm: Good air movement  bilaterally. No rales, wheezing, rhonchi or rubs. Abd: Soft, nondistended, nontender, no rebound pain or gaurding, no mass or organomegaly, BS present. Ext: No LE edema bilaterally. 2+DP/PT pulse bilaterally. Musculoskeletal: No gross deformity, no red, hot, swollen joints  Skin: No rashes or wounds on exposed surfaces  Neuro: Alert, oriented X3, cranial nerves II-XII grossly intact. No focal findings Psych: Patient is not overtly psychotic, appropriate mood and affect.  Labs on Admission:  Basic Metabolic Panel:  Recent Labs Lab 12/03/15 1644  NA 139  K 3.7  CL 99*  CO2 26  GLUCOSE 141*  BUN 70*  CREATININE 1.43*  CALCIUM 9.1   Liver Function Tests: No results for input(s): AST, ALT, ALKPHOS, BILITOT, PROT, ALBUMIN in the last 168 hours. No results for input(s): LIPASE, AMYLASE in the last 168 hours. No results for input(s): AMMONIA in the last 168 hours. CBC:  Recent Labs Lab 12/03/15 1644  WBC 21.1*  HGB 13.8  HCT 40.0  MCV 92.8  PLT 466*   Cardiac Enzymes: No results for input(s): CKTOTAL, CKMB,  CKMBINDEX, TROPONINI in the last 168 hours.  BNP (last 3 results) No results for input(s): BNP in the last 8760 hours.  ProBNP (last 3 results) No results for input(s): PROBNP in the last 8760 hours.  CBG:  Recent Labs Lab 12/03/15 1652  GLUCAP 125*    Radiological Exams on Admission: Dg Chest Port 1 View  12/03/2015  CLINICAL DATA:  Nasal and chest congestion EXAM: PORTABLE CHEST 1 VIEW COMPARISON:  None. FINDINGS: There is mild opacity in the right base compared to the left. No other pulmonary opacities. No pneumothorax. No pulmonary nodules or masses. Mild cardiomegaly. The hila and mediastinum are normal. IMPRESSION: Right lower lobe infiltrate.  Recommend follow-up to resolution. Electronically Signed   By: Dorise Bullion III M.D   On: 12/03/2015 16:39    EKG: Independently reviewed.  Abnormal findings: Atrial fibrillation with RVR (vent rate 161), LVH by  voltage criteria with repol abnormality    Assessment/Plan  1. Atrial fibrillation with RVR  - CHADS-VASc score is 2 for age >65 and HTN  - TTE (07/30/2015) with severe LA dilation, suggesting this will be chronic  - No response to 5 mg IVP metoprolol in ED - Diltiazem bolused and infusion started, titrated up to 15 mL/hr with rate persisting in 120-130, will re-bolus with 15 mg; if this fails, will likely use digoxin next  - Heparin infusion started with pharmacy consultation  - Monitor on telemetry    2. CAP  - RLL infiltrate noted on CXR - Leukocytosis of 21k on admission  - Treated with empiric Rocephin and azithromycin in ED, will continue with this  - Follow-up imaging recommended to exclude underlying mass   3. AKI  - SCr 1.43 on admission, up from 0.74 at last check 6 mos prior  - Could be multifactorial with contribution from acute infectious process, but suspect this is primarily related to RVR and associated reduction in cardiac output - Anticipate improvement with fluid resuscitation and effective HR control  - If fails to improve as expected, may to extend workup with urine studies and renal US    4. Hypertension  - At the lower end of normal currently  - Previously on Losartan, but pt stopped taking  - Monitoring for now, will treat prn    5. Thrombocytosis  - Platelet count 466,000 on admission  - Likely representing an acute phase reactant in setting of PNA and new-onset a fib RVR  - Trend   6. MVP with flail leaflet and severe MR  - Demonstrated on TTE (07/30/15)  - Has consulted with Dr. Roxy Manns of [place] and is planning tentatively for operative repair    DVT ppx:  Heparin infusion per atrial fib dosing   Code Status: Full code Family Communication: None at bed side.               Disposition Plan: Admit to inpatient   Date of Service 12/03/2015    Vianne Bulls, MD [facility] Pager [phone]  If 7PM-7AM, please contact  night-coverage [email or web address] Password [facility] 12/03/2015, 11:54 PM

## 2015-12-03 NOTE — ED Notes (Signed)
Upon performing EKG, Pt's heart rate increased to 150s.

## 2015-12-03 NOTE — ED Notes (Signed)
Pt c/o nasal/chest congestion and scratchy throat x 2 weeks and dizziness/near-syncope and "warmth in forehead" x 2 episodes within past 2 weeks.  Denies pain.  Pt reports that he has been under stress recently and it's been a year since wife's death.  Pt is concerned for anxiety or dehydration.  Recently diagnosed w/ cardiac issues.

## 2015-12-03 NOTE — ED Notes (Signed)
Pt made aware of urine sample needed. 

## 2015-12-03 NOTE — ED Provider Notes (Signed)
CSN: [id number]     Arrival date & time 12/03/15  1536 History   First MD Initiated Contact with Patient 12/03/15 1616     Chief Complaint  Patient presents with  . URI  . Near Syncope     (Consider location/radiation/quality/duration/timing/severity/associated sxs/prior Treatment) HPI   Brian Martinez is a 65 y.o. male who presents for evaluation of periods of palpitation, malaise, dizziness, without near-syncope or syncope, for 1 week. He is worried about "dehydration". He denies nausea, vomiting, anorexia, diarrhea. Is been no fever,  or chest pain. He has a history of mitral regurgitation, and takes aspirin, 81 mg each day. He denies other illnesses. He is able to drive himself here for evaluation and treatment. He has had an occasional cough productive of sputum but feels like it is getting better. He denies fever, chills, dysuria, urinary frequency, abdominal or back pain. There are no other known modifying factors.   Past Medical History  Diagnosis Date  . Heart murmur   . Severe mitral regurgitation 07/30/2015  . MVP (mitral valve prolapse) 06/10/2015  . Abnormal EKG 06/10/2015  . PVC (premature ventricular contraction) 06/10/2015   Past Surgical History  Procedure Laterality Date  . Inguinal hernia repair     Family History  Problem Relation Age of Onset  . Hypertension Father   . Cancer Brother   . Hypertension Brother   . Hypertension Mother   . Parkinsonism Mother   . Hypertension Brother   . Hypertension Brother    Social History  Substance Use Topics  . Smoking status: Never Smoker   . Smokeless tobacco: None  . Alcohol Use: Yes    Review of Systems  All other systems reviewed and are negative.     Allergies  Review of patient's allergies indicates no known allergies.  Home Medications   Prior to Admission medications   Medication Sig Start Date End Date Taking? Authorizing Provider  aspirin EC 81 MG tablet Take 81 mg by mouth daily.   Yes  Historical Provider, MD  Cholecalciferol (VITAMIN D3) 5000 UNITS CAPS Take 1 capsule (5,000 Units total) by mouth daily. 06/11/15  Yes Dorothy Spark, MD  dextromethorphan (DELSYM) 30 MG/5ML liquid Take 30 mg by mouth as needed for cough.   Yes Historical Provider, MD  vitamin B-12 (CYANOCOBALAMIN) 100 MCG tablet Take 1 tablet (100 mcg total) by mouth daily. 06/11/15  Yes Dorothy Spark, MD  losartan (COZAAR) 25 MG tablet Take 1 tablet (25 mg total) by mouth daily. Patient not taking: Reported on 12/03/2015 09/04/15   Dorothy Spark, MD   BP 110/76 mmHg  Pulse 131  Temp(Src) 97.6 F (36.4 C) (Oral)  Resp 22  SpO2 96% Physical Exam  Constitutional: He is oriented to person, place, and time. He appears well-developed and well-nourished.  HENT:  Head: Normocephalic and atraumatic.  Right Ear: External ear normal.  Left Ear: External ear normal.  Eyes: Conjunctivae and EOM are normal. Pupils are equal, round, and reactive to light.  Neck: Normal range of motion and phonation normal. Neck supple.  Cardiovascular: Normal heart sounds.   Irregularly irregular rhythm with tachycardia.  Pulmonary/Chest: Effort normal and breath sounds normal. No respiratory distress. He has no wheezes. He has no rales. He exhibits no bony tenderness.  Abdominal: Soft. There is no tenderness.  Musculoskeletal: Normal range of motion. He exhibits no edema or tenderness.  Neurological: He is alert and oriented to person, place, and time. No cranial nerve deficit or  sensory deficit. He exhibits normal muscle tone. Coordination normal.  Skin: Skin is warm, dry and intact.  Psychiatric: He has a normal mood and affect. His behavior is normal. Judgment and thought content normal.  Nursing note and vitals reviewed.   ED Course  Procedures (including critical care time)  Medications  0.9 %  sodium chloride infusion ( Intravenous New Bag/Given 12/03/15 1659)  diltiazem (CARDIZEM) 1 mg/mL load via infusion 10  mg (10 mg Intravenous Given 12/03/15 1908)    And  diltiazem (CARDIZEM) 100 mg in dextrose 5 % 100 mL (1 mg/mL) infusion (12.5 mg/hr Intravenous New Bag/Given 12/03/15 2152)  cefTRIAXone (ROCEPHIN) 1 g in dextrose 5 % 50 mL IVPB (1 g Intravenous New Bag/Given 12/03/15 2159)  azithromycin (ZITHROMAX) tablet 500 mg (500 mg Oral Given 12/03/15 2152)    Followed by  azithromycin (ZITHROMAX) tablet 250 mg (not administered)  sodium chloride 0.9 % bolus 500 mL (0 mLs Intravenous Stopped 12/03/15 1741)  sodium chloride 0.9 % bolus 1,000 mL (0 mLs Intravenous Stopped 12/03/15 1907)  metoprolol (LOPRESSOR) injection 5 mg (5 mg Intravenous Given 12/03/15 1740)    Patient Vitals for the past 24 hrs:  BP Temp Temp src Pulse Resp SpO2  12/03/15 2148 110/76 mmHg - - (!) 131 22 96 %  12/03/15 2111 125/75 mmHg - - 116 24 96 %  12/03/15 2100 127/100 mmHg - - 68 16 98 %  12/03/15 2032 121/82 mmHg - - 70 24 97 %  12/03/15 2021 109/76 mmHg - - (!) 123 21 97 %  12/03/15 1900 115/97 mmHg - - (!) 127 24 96 %  12/03/15 1830 106/71 mmHg - - 117 19 96 %  12/03/15 1800 106/77 mmHg - - (!) 59 20 (!) 77 %  12/03/15 1730 113/82 mmHg - - 60 23 96 %  12/03/15 1700 100/69 mmHg - - (!) 148 24 95 %  12/03/15 1630 (!) 79/63 mmHg - - 79 21 95 %  12/03/15 1615 118/70 mmHg - - 70 26 95 %  12/03/15 1551 112/92 mmHg 97.6 F (36.4 C) Oral 83 16 95 %    5:51 PM Reevaluation with update and discussion. After initial assessment and treatment, an updated evaluation reveals Transient hypotension improved with IV fluid bolus. Heart rate now 125. After Lopressor IV. He remains irregular, atrial fibrillation. Cardizem bolus and drip noted. Hester Forget L   This patients CHA2DS2-VASc Score and unadjusted Ischemic Stroke Rate (% per year) is equal to 0.6 % stroke rate/year from a score of 1  Above score calculated as 1 point each if present [CHF, HTN, DM, Vascular=MI/PAD/Aortic Plaque, Age if 65-74, or Male] Above score calculated as 2  points each if present [Age > 75, or Stroke/TIA/TE]  9:50 PM-Consult complete with Hospitalist. Patient case explained and discussed. He agrees to admit patient for further evaluation and treatment. Call ended at 2155  CRITICAL CARE Performed by: Richarda Blade Total critical care time: 55 minutes Critical care time was exclusive of separately billable procedures and treating other patients. Critical care was necessary to treat or prevent imminent or life-threatening deterioration. Critical care was time spent personally by me on the following activities: development of treatment plan with patient and/or surrogate as well as nursing, discussions with consultants, evaluation of patient's response to treatment, examination of patient, obtaining history from patient or surrogate, ordering and performing treatments and interventions, ordering and review of laboratory studies, ordering and review of radiographic studies, pulse oximetry and re-evaluation of patient's condition.  Labs Review Labs Reviewed  BASIC METABOLIC PANEL - Abnormal; Notable for the following:    Chloride 99 (*)    Glucose, Bld 141 (*)    BUN 70 (*)    Creatinine, Ser 1.43 (*)    GFR calc non Af Amer 50 (*)    GFR calc Af Amer 57 (*)    All other components within normal limits  CBC - Abnormal; Notable for the following:    WBC 21.1 (*)    Platelets 466 (*)    All other components within normal limits  URINALYSIS, ROUTINE W REFLEX MICROSCOPIC (NOT AT [facility]) - Abnormal; Notable for the following:    Color, Urine AMBER (*)    APPearance CLOUDY (*)    Hgb urine dipstick MODERATE (*)    Protein, ur 30 (*)    Leukocytes, UA SMALL (*)    All other components within normal limits  URINE MICROSCOPIC-ADD ON - Abnormal; Notable for the following:    Squamous Epithelial / LPF 0-5 (*)    Bacteria, UA FEW (*)    Casts HYALINE CASTS (*)    All other components within normal limits  CBG MONITORING, ED - Abnormal; Notable for  the following:    Glucose-Capillary 125 (*)    All other components within normal limits  I-STAT TROPOININ, ED    Imaging Review Dg Chest Port 1 View  12/03/2015  CLINICAL DATA:  Nasal and chest congestion EXAM: PORTABLE CHEST 1 VIEW COMPARISON:  None. FINDINGS: There is mild opacity in the right base compared to the left. No other pulmonary opacities. No pneumothorax. No pulmonary nodules or masses. Mild cardiomegaly. The hila and mediastinum are normal. IMPRESSION: Right lower lobe infiltrate.  Recommend follow-up to resolution. Electronically Signed   By: Dorise Bullion III M.D   On: 12/03/2015 16:39   I have personally reviewed and evaluated these images and lab results as part of my medical decision-making.   EKG Interpretation   Date/Time:  Thursday December 03 2015 16:08:30 EDT Ventricular Rate:  160 PR Interval:    QRS Duration: 102 QT Interval:  286 QTC Calculation: 467 R Axis:   58 Text Interpretation:  Atrial fibrillation with rapid V-rate LVH with  secondary repolarization abnormality ST depression, probably rate related  No old tracing to compare Confirmed by [facility]  MD, Glenda Spelman [phone]) on  12/03/2015 4:19:00 PM      MDM   Final diagnoses:  Atrial fibrillation with RVR ([place])  Community acquired pneumonia    New onset A. fib, RVR with low chads score. Possible community-acquired pneumonia, however , his cough has been improving. He will require admission for further evaluation and treatment.   Nursing Notes Reviewed/ Care Coordinated, and agree without changes. Applicable Imaging Reviewed.  Interpretation of Laboratory Data incorporated into ED treatment  Plan: Admit    Daleen Bo, MD 12/03/15 2201

## 2015-12-04 DIAGNOSIS — I4891 Unspecified atrial fibrillation: Principal | ICD-10-CM

## 2015-12-04 LAB — T4, FREE: FREE T4: 1.4 ng/dL — AB (ref 0.61–1.12)

## 2015-12-04 LAB — CBC WITH DIFFERENTIAL/PLATELET
BASOS PCT: 0 %
Basophils Absolute: 0 10*3/uL (ref 0.0–0.1)
EOS ABS: 0 10*3/uL (ref 0.0–0.7)
Eosinophils Relative: 0 %
HCT: 35 % — ABNORMAL LOW (ref 39.0–52.0)
Hemoglobin: 11.8 g/dL — ABNORMAL LOW (ref 13.0–17.0)
Lymphocytes Relative: 13 %
Lymphs Abs: 1.9 10*3/uL (ref 0.7–4.0)
MCH: 30.4 pg (ref 26.0–34.0)
MCHC: 33.7 g/dL (ref 30.0–36.0)
MCV: 90.2 fL (ref 78.0–100.0)
MONO ABS: 1 10*3/uL (ref 0.1–1.0)
MONOS PCT: 7 %
NEUTROS PCT: 80 %
Neutro Abs: 11.1 10*3/uL — ABNORMAL HIGH (ref 1.7–7.7)
PLATELETS: 298 10*3/uL (ref 150–400)
RBC: 3.88 MIL/uL — ABNORMAL LOW (ref 4.22–5.81)
RDW: 12.5 % (ref 11.5–15.5)
WBC: 14 10*3/uL — ABNORMAL HIGH (ref 4.0–10.5)

## 2015-12-04 LAB — EXPECTORATED SPUTUM ASSESSMENT W REFEX TO RESP CULTURE

## 2015-12-04 LAB — LIPID PANEL
Cholesterol: 138 mg/dL (ref 0–200)
HDL: 24 mg/dL — ABNORMAL LOW (ref >40)
LDL CALC: 95 mg/dL (ref 0–99)
Total CHOL/HDL Ratio: 5.8 RATIO
Triglycerides: 96 mg/dL (ref <150)
VLDL: 19 mg/dL (ref 0–40)

## 2015-12-04 LAB — EXPECTORATED SPUTUM ASSESSMENT W GRAM STAIN, RFLX TO RESP C

## 2015-12-04 LAB — PROCALCITONIN

## 2015-12-04 LAB — BASIC METABOLIC PANEL
Anion gap: 10 (ref 5–15)
BUN: 56 mg/dL — ABNORMAL HIGH (ref 6–20)
CALCIUM: 8.4 mg/dL — AB (ref 8.9–10.3)
CO2: 25 mmol/L (ref 22–32)
CREATININE: 0.92 mg/dL (ref 0.61–1.24)
Chloride: 102 mmol/L (ref 101–111)
GLUCOSE: 113 mg/dL — AB (ref 65–99)
Potassium: 3.6 mmol/L (ref 3.5–5.1)
Sodium: 137 mmol/L (ref 135–145)

## 2015-12-04 LAB — TSH: TSH: 0.387 u[IU]/mL (ref 0.350–4.500)

## 2015-12-04 LAB — PROTIME-INR
INR: 1.25 (ref 0.00–1.49)
PROTHROMBIN TIME: 15.8 s — AB (ref 11.6–15.2)

## 2015-12-04 LAB — MAGNESIUM: MAGNESIUM: 2.2 mg/dL (ref 1.7–2.4)

## 2015-12-04 LAB — MRSA PCR SCREENING: MRSA BY PCR: NEGATIVE

## 2015-12-04 LAB — HEPARIN LEVEL (UNFRACTIONATED)

## 2015-12-04 LAB — APTT: aPTT: 29 seconds (ref 24–37)

## 2015-12-04 MED ORDER — HEPARIN BOLUS VIA INFUSION
2000.0000 [IU] | Freq: Once | INTRAVENOUS | Status: AC
  Administered 2015-12-04: 2000 [IU] via INTRAVENOUS
  Filled 2015-12-04: qty 2000

## 2015-12-04 MED ORDER — GUAIFENESIN ER 600 MG PO TB12
1200.0000 mg | ORAL_TABLET | Freq: Two times a day (BID) | ORAL | Status: DC
Start: 2015-12-04 — End: 2015-12-10
  Administered 2015-12-04 – 2015-12-10 (×8): 1200 mg via ORAL
  Filled 2015-12-04 (×14): qty 2

## 2015-12-04 MED ORDER — PATIENT'S GUIDE TO USING COUMADIN BOOK
Freq: Once | Status: AC
  Administered 2015-12-04: 14:00:00
  Filled 2015-12-04: qty 1

## 2015-12-04 MED ORDER — HEPARIN (PORCINE) IN NACL 100-0.45 UNIT/ML-% IJ SOLN
1000.0000 [IU]/h | INTRAMUSCULAR | Status: DC
  Administered 2015-12-04: 800 [IU]/h via INTRAVENOUS
  Filled 2015-12-04 (×3): qty 250

## 2015-12-04 MED ORDER — HEPARIN (PORCINE) IN NACL 100-0.45 UNIT/ML-% IJ SOLN
1600.0000 [IU]/h | INTRAMUSCULAR | Status: DC
  Administered 2015-12-04: 1250 [IU]/h via INTRAVENOUS
  Administered 2015-12-05: 1550 [IU]/h via INTRAVENOUS
  Administered 2015-12-06 – 2015-12-07 (×3): 1650 [IU]/h via INTRAVENOUS
  Administered 2015-12-08: 1600 [IU]/h via INTRAVENOUS
  Administered 2015-12-08: 1650 [IU]/h via INTRAVENOUS
  Administered 2015-12-09: 1600 [IU]/h via INTRAVENOUS
  Filled 2015-12-04 (×12): qty 250

## 2015-12-04 MED ORDER — WARFARIN - PHARMACIST DOSING INPATIENT
Freq: Every day | Status: DC
  Administered 2015-12-07: 17:00:00

## 2015-12-04 MED ORDER — WARFARIN SODIUM 5 MG PO TABS: 5.0000 mg | ORAL_TABLET | Freq: Once | ORAL | Status: DC

## 2015-12-04 MED ORDER — WARFARIN VIDEO
Freq: Once | Status: AC
  Administered 2015-12-05: 1

## 2015-12-04 MED ORDER — CETYLPYRIDINIUM CHLORIDE 0.05 % MT LIQD
7.0000 mL | Freq: Two times a day (BID) | OROMUCOSAL | Status: DC
  Administered 2015-12-04 – 2015-12-10 (×9): 7 mL via OROMUCOSAL

## 2015-12-04 NOTE — Progress Notes (Signed)
ANTICOAGULATION CONSULT NOTE - Initial Consult  Pharmacy Consult for heparin Indication: atrial fibrillation  No Known Allergies  Patient Measurements: Height: 6\' 1"  (185.4 cm) Weight: 144 lb 6.4 oz (65.5 kg) IBW/kg (Calculated) : 79.9 Heparin Dosing Weight:   Vital Signs: Temp: 98.9 F (37.2 C) (03/17 0400) Temp Source: Oral (03/17 0400) BP: 140/106 mmHg (03/17 0230) Pulse Rate: 102 (03/17 0230)  Labs:  Recent Labs  12/03/15 1644 12/04/15 0001 12/04/15 0312  HGB 13.8  --  11.8*  HCT 40.0  --  35.0*  PLT 466*  --  298  APTT  --  29  --   LABPROT  --  15.8*  --   INR  --  1.25  --   CREATININE 1.43*  --  0.92    Estimated Creatinine Clearance: 73.2 mL/min (by C-G formula based on Cr of 0.92).   Medical History: Past Medical History  Diagnosis Date  . Heart murmur   . Severe mitral regurgitation 07/30/2015  . MVP (mitral valve prolapse) 06/10/2015  . Abnormal EKG 06/10/2015  . PVC (premature ventricular contraction) 06/10/2015    Medications:  Infusions:  . diltiazem (CARDIZEM) infusion 15 mg/hr (12/04/15 0155)  . heparin 800 Units/hr (12/04/15 0050)    Assessment: Patient with new onset afib.  No oral anticoagulants noted on med rec.   Goal of Therapy:  Heparin level 0.3-0.7 units/ml Monitor platelets by anticoagulation protocol: Yes   Plan:  Heparin bolus 2000 units iv x1 Heparin drip at 800 units/hr Daily CBC Next heparin level at [place], [place] 12/04/2015,7:08 AM

## 2015-12-04 NOTE — Progress Notes (Addendum)
ANTICOAGULATION CONSULT NOTE  Pharmacy Consult for Heparin Indication: atrial fibrillation  No Known Allergies  Patient Measurements: Height: 6\' 1"  (185.4 cm) Weight: 144 lb 6.4 oz (65.5 kg) IBW/kg (Calculated) : 79.9 HEPARIN DW (KG): 65.5   Vital Signs: Temp: 97.9 F (36.6 C) (03/17 2000) Temp Source: Oral (03/17 2000) BP: 148/84 mmHg (03/17 2100) Pulse Rate: 78 (03/17 2100)  Labs:  Recent Labs  12/03/15 1644 12/04/15 0001 12/04/15 0312 12/04/15 1001 12/04/15 2045  HGB 13.8  --  11.8*  --   --   HCT 40.0  --  35.0*  --   --   PLT 466*  --  298  --   --   APTT  --  29  --   --   --   LABPROT  --  15.8*  --   --   --   INR  --  1.25  --   --   --   HEPARINUNFRC  --   --   --  <0.10* <0.10*  CREATININE 1.43*  --  0.92  --   --     Estimated Creatinine Clearance: 73.2 mL/min (by C-G formula based on Cr of 0.92).   Medical History: Past Medical History  Diagnosis Date  . Heart murmur   . Severe mitral regurgitation 07/30/2015  . MVP (mitral valve prolapse) 06/10/2015  . Abnormal EKG 06/10/2015  . PVC (premature ventricular contraction) 06/10/2015    Medications:  Infusions:  . diltiazem (CARDIZEM) infusion 15 mg/hr (12/04/15 1900)  . heparin 1,000 Units/hr (12/04/15 1900)    Assessment: Patient with new onset afib.  Heparin level undetectable on increased rate, however still sub-therapeutic.  RN does not relay any issues with pump.  CBC stable.  No bleeding noted.  Not on oral anticoagulant PTA.  Cardiology recommendations for Coumadin noted.  Baseline INR 1.25.   Patient refused to start Coumadin 3/18 until he discussed further with MD.  Patient is on Rocephin/Zithromax for PNA- potential drug-drug interaction with Zithromax could increase INR.     Goal of Therapy:  Heparin level 0.3-0.7 units/ml Monitor platelets by anticoagulation protocol: Yes  INR 2-3   Plan:   Increase heparin gtt to 1400 units/hr  Recheck 6h heparin level  Daily heparin  level, CBC  F/U with patient re: Coumadin.  Coumadin 5mg  po x1 today if patient agreeable.  Daily INR  Netta Cedars, PharmD, BCPS Pager: [phone] 12/05/2015@8 :23 AM   Addendum: Repeat heparin level on 1400 units/hr still sub-therapeutic (0.26).  Will further increase heparin to 1550 units/hr. Recheck 6hr heparin level.  Spoke with patient re: Coumadin and he is ready to initiate therapy today after speaking with MD.  All questions answered.  Proceed with Coumadin orders as above.   Netta Cedars, PharmD, BCPS Pager: [phone] 12/05/2015@4 :28 PM

## 2015-12-04 NOTE — Progress Notes (Signed)
Initial Nutrition Assessment  DOCUMENTATION CODES:   Severe malnutrition in context of chronic illness  INTERVENTION:  -RD to monitor for needs -Pt declined ONS  NUTRITION DIAGNOSIS:   Malnutrition related to chronic illness as evidenced by severe depletion of body fat, severe depletion of muscle mass.  GOAL:   Patient will meet greater than or equal to 90% of their needs  MONITOR:   PO intake, Labs, I & O's, Weight trends  REASON FOR ASSESSMENT:   Malnutrition Screening Tool    ASSESSMENT:   Brian Martinez is a 65 y.o. male with PMH of hypertension and mitral valve prolapse with flail leaflet and severe mitral regurgitation currently under evaluation by CT surgery and now presents to the ED with persistent palpitations and dyspnea with minimal exertion  Spoke with pt at bedside. He admits to poor appetite after his wife passed away a year ago. He dropped from around 170#-140#. Says he recovered and got his weight back up to about 158#, but has been steadily losing for 3 months or so now. Currently exhibits a 13#/8.2% severe wt loss in 3 months.  Within the past week, he states he was eating mostly frozen dinners or fast-food 2-3 times/day. This was with onset of shortness of breath, palpitations. Prior to that, he was cooking regularly.   Suggested boost/ensure to patient, he states he was doing carnation instant breakfast but seemed to experience larger mucosal secretions while drinking it. Explained that milk products may thicken secretions but don't generally increase the amount of secretions. He did not want to risk possible thicker secretions at this time.  Will continue to monitor for needs.  Labs and Medications reviewed.  Diet Order:  Diet Heart Room service appropriate?: Yes; Fluid consistency:: Thin  Skin:  Reviewed, no issues  Last BM:  PTA  Height:   Ht Readings from Last 1 Encounters:  12/04/15 6\' 1"  (1.854 m)    Weight:   Wt Readings from Last 1  Encounters:  12/04/15 144 lb 6.4 oz (65.5 kg)    Ideal Body Weight:  83.63 kg  BMI:  Body mass index is 19.06 kg/(m^2).  Estimated Nutritional Needs:   Kcal:  1650-1950 calories  Protein:  65-75 grams  Fluid:  >/= 1.65L  EDUCATION NEEDS:   No education needs identified at this time  Satira Anis. Shelia Kingsberry, MS, RD LDN After Hours/Weekend Pager [phone]

## 2015-12-04 NOTE — Progress Notes (Signed)
Patient Demographics  Brian Martinez, is a 65 y.o. male, DOB - 02/21/1951, [id number]  Admit date - 12/03/2015   Admitting Physician Vianne Bulls, MD  Outpatient Primary MD for the patient is Dorothy Spark, MD  LOS - 1   Chief Complaint  Patient presents with  . URI  . Near Syncope       Admission HPI/Brief narrative: 65 yo with history of hypertension, severe mitral regurgitation secondary to prolapse, PVCs, Presents with palpitation, dyspnea and presyncope, in ED workup significant for CAP, and new onset A. fib with RVR.  Subjective:   Brian Martinez today has, No headache, No chest pain, No abdominal pain - Reports dyspnea is improving, complaints of cough, and palpitation  Assessment & Plan    Principal Problem:   Atrial fibrillation with RVR ([facility]) Active Problems:   MVP (mitral valve prolapse)   Severe mitral regurgitation   Hyperlipidemia   Hypertensive heart disease   AKI (acute kidney injury) ([place] [address])   CAP (community acquired pneumonia)   Thrombocytosis (HCC)  Atrial fibrillation with RVR  - CHADS-VASc score is 2 for age >46 and HTN  - TTE (07/30/2015) with severe LA dilation, suggesting this will be chronic  - On Cardizem drip, management per cardiology - Monitor on telemetry  - Currently on heparin GTT, pharmacy to dose warfarin, will need to be on warfarin given his MV prolapse.  CAP  - RLL infiltrate noted on CXR - Leukocytosis of 21k on admission , improving, today is 14k. - Continue with Rocephin and azithromycin in ED, will continue with this  - Follow-up imaging recommended to exclude underlying mass   AKI  - SCr 1.43 on admission, up from 0.74 at last check 6 mos prior  - Improving with IV fluids   Hypertension  - At the lower end of normal currently  - Previously on Losartan, but pt stopped taking  - Monitoring for now, will treat prn    Thrombocytosis  - Platelet count 466,000 on admission  - Likely representing an acute phase reactant in setting of PNA and new-onset a fib RVR  - Resolved  MVP with flail leaflet and severe MR  - Demonstrated on TTE (07/30/15)  - Has consulted with Dr. Roxy Manns of [place] and is planning tentatively for operative repair   Code Status: Full  Family Communication: None at bedside  Disposition Plan: Home when stable, remains in stepdown   Procedures  None   Consults   Cardiology   Medications  Scheduled Meds: . antiseptic oral rinse  7 mL Mouth Rinse BID  . aspirin EC  81 mg Oral Daily  . azithromycin  250 mg Oral Daily  . cefTRIAXone (ROCEPHIN)  IV  1 g Intravenous Q24H  . cholecalciferol  5,000 Units Oral Daily  . guaiFENesin  1,200 mg Oral BID  . vitamin B-12  100 mcg Oral Daily   Continuous Infusions: . diltiazem (CARDIZEM) infusion 15 mg/hr (12/04/15 1200)  . heparin 1,000 Units/hr (12/04/15 1135)   PRN Meds:.acetaminophen, dextromethorphan, diltiazem, ondansetron (ZOFRAN) IV  DVT Prophylaxis  Heparin GTT  Lab Results  Component Value Date   PLT 298 12/04/2015    Antibiotics    Anti-infectives    Start  Dose/Rate Route Frequency Ordered Stop   12/04/15 2000  cefTRIAXone (ROCEPHIN) 1 g in dextrose 5 % 50 mL IVPB     1 g 100 mL/hr over 30 Minutes Intravenous Every 24 hours 12/03/15 2353 12/11/15 1959   12/04/15 1000  azithromycin (ZITHROMAX) tablet 250 mg     250 mg Oral Daily 12/03/15 2128 12/08/15 0959   12/03/15 2130  cefTRIAXone (ROCEPHIN) 1 g in dextrose 5 % 50 mL IVPB     1 g 100 mL/hr over 30 Minutes Intravenous  Once 12/03/15 2128 12/03/15 2229   12/03/15 2130  azithromycin (ZITHROMAX) tablet 500 mg     500 mg Oral Daily 12/03/15 2128 12/03/15 2152          Objective:   Filed Vitals:   12/04/15 0900 12/04/15 1000 12/04/15 1100 12/04/15 1200  BP: 133/82 126/89 121/91 149/100  Pulse: 50 134 88 70  Temp:    98.5 F (36.9 C)   TempSrc:    Oral  Resp: 19 23 15 14   Height:      Weight:      SpO2: 96% 95% 98% 97%    Wt Readings from Last 3 Encounters:  12/04/15 65.5 kg (144 lb 6.4 oz)  09/04/15 71.215 kg (157 lb)  08/11/15 71.668 kg (158 lb)     Intake/Output Summary (Last 24 hours) at 12/04/15 1315 Last data filed at 12/04/15 1200  Gross per 24 hour  Intake 1665.16 ml  Output      0 ml  Net 1665.16 ml     Physical Exam  Awake Alert, Oriented X 3, No new F.N deficits, Normal affect Brian Martinez.AT,PERRAL Supple Neck,No JVD, No cervical lymphadenopathy appriciated.  Symmetrical Chest wall movement, Good air movement bilaterally, No wheezing Irregular,No Gallops,Rubs, murmur +, No Parasternal Heave +ve B.Sounds, Abd Soft, No tenderness, No organomegaly appriciated, No rebound - guarding or rigidity. No Cyanosis, Clubbing or edema, No new Rash or bruise     Data Review   Micro Results Recent Results (from the past 240 hour(s))  MRSA PCR Screening     Status: None   Collection Time: 12/04/15  1:49 AM  Result Value Ref Range Status   MRSA by PCR NEGATIVE NEGATIVE Final    Comment:        The GeneXpert MRSA Assay (FDA approved for NASAL specimens only), is one component of a comprehensive MRSA colonization surveillance program. It is not intended to diagnose MRSA infection nor to guide or monitor treatment for MRSA infections.     Radiology Reports Dg Chest Port 1 View  12/03/2015  CLINICAL DATA:  Nasal and chest congestion EXAM: PORTABLE CHEST 1 VIEW COMPARISON:  None. FINDINGS: There is mild opacity in the right base compared to the left. No other pulmonary opacities. No pneumothorax. No pulmonary nodules or masses. Mild cardiomegaly. The hila and mediastinum are normal. IMPRESSION: Right lower lobe infiltrate.  Recommend follow-up to resolution. Electronically Signed   By: Dorise Bullion III M.D   On: 12/03/2015 16:39     CBC  Recent Labs Lab 12/03/15 1644 12/04/15 0312  WBC 21.1* 14.0*   HGB 13.8 11.8*  HCT 40.0 35.0*  PLT 466* 298  MCV 92.8 90.2  MCH 32.0 30.4  MCHC 34.5 33.7  RDW 12.4 12.5  LYMPHSABS  --  1.9  MONOABS  --  1.0  EOSABS  --  0.0  BASOSABS  --  0.0    Chemistries   Recent Labs Lab 12/03/15 1644 12/04/15 0001 12/04/15 [id number]  NA 139  --  137  K 3.7  --  3.6  CL 99*  --  102  CO2 26  --  25  GLUCOSE 141*  --  113*  BUN 70*  --  56*  CREATININE 1.43*  --  0.92  CALCIUM 9.1  --  8.4*  MG  --  2.2  --    ------------------------------------------------------------------------------------------------------------------ estimated creatinine clearance is 73.2 mL/min (by C-G formula based on Cr of 0.92). ------------------------------------------------------------------------------------------------------------------ No results for input(s): HGBA1C in the last 72 hours. ------------------------------------------------------------------------------------------------------------------  Recent Labs  12/04/15 0312  CHOL 138  HDL 24*  LDLCALC 95  TRIG 96  CHOLHDL 5.8   ------------------------------------------------------------------------------------------------------------------  Recent Labs  12/04/15 0001  TSH 0.387   ------------------------------------------------------------------------------------------------------------------ No results for input(s): VITAMINB12, FOLATE, FERRITIN, TIBC, IRON, RETICCTPCT in the last 72 hours.  Coagulation profile  Recent Labs Lab 12/04/15 0001  INR 1.25    No results for input(s): DDIMER in the last 72 hours.  Cardiac Enzymes No results for input(s): CKMB, TROPONINI, MYOGLOBIN in the last 168 hours.  Invalid input(s): CK ------------------------------------------------------------------------------------------------------------------ Invalid input(s): POCBNP     Time Spent in minutes   30 minutes   Sebastion Jun M.D on 12/04/2015 at 1:15 PM  Between 7am to 7pm - Pager -  [phone]  After 7pm go to [email or web address] - password [facility]  [facility]   Office  [phone]

## 2015-12-04 NOTE — Care Management Note (Signed)
Case Management Note  Patient Details  Name: Brian Martinez MRN: [id number] Date of Birth: July 03, 1951  Subjective/Objective:  65 y/o m admitted w/afib. From home.                  Action/Plan:d/c plan home.   Expected Discharge Date:                  Expected Discharge Plan:  Home/Self Care  In-House Referral:     Discharge planning Services  CM Consult  Post Acute Care Choice:    Choice offered to:     DME Arranged:    DME Agency:     HH Arranged:    HH Agency:     Status of Service:  In process, will continue to follow  Medicare Important Message Given:    Date Medicare IM Given:    Medicare IM give by:    Date Additional Medicare IM Given:    Additional Medicare Important Message give by:     If discussed at [place] of Stay Meetings, dates discussed:    Additional Comments:  Dessa Phi, RN 12/04/2015, 3:31 PM

## 2015-12-04 NOTE — Progress Notes (Addendum)
ANTICOAGULATION CONSULT NOTE  Pharmacy Consult for Heparin Indication: atrial fibrillation  No Known Allergies  Patient Measurements: Height: 6\' 1"  (185.4 cm) Weight: 144 lb 6.4 oz (65.5 kg) IBW/kg (Calculated) : 79.9 HEPARIN DW (KG): 65.5   Vital Signs: Temp: 98.2 F (36.8 C) (03/17 0809) Temp Source: Oral (03/17 0809) BP: 126/89 mmHg (03/17 1000) Pulse Rate: 134 (03/17 1000)  Labs:  Recent Labs  12/03/15 1644 12/04/15 0001 12/04/15 0312 12/04/15 1001  HGB 13.8  --  11.8*  --   HCT 40.0  --  35.0*  --   PLT 466*  --  298  --   APTT  --  29  --   --   LABPROT  --  15.8*  --   --   INR  --  1.25  --   --   HEPARINUNFRC  --   --   --  <0.10*  CREATININE 1.43*  --  0.92  --     Estimated Creatinine Clearance: 73.2 mL/min (by C-G formula based on Cr of 0.92).   Medical History: Past Medical History  Diagnosis Date  . Heart murmur   . Severe mitral regurgitation 07/30/2015  . MVP (mitral valve prolapse) 06/10/2015  . Abnormal EKG 06/10/2015  . PVC (premature ventricular contraction) 06/10/2015    Medications:  Infusions:  . diltiazem (CARDIZEM) infusion 15 mg/hr (12/04/15 0155)  . heparin 800 Units/hr (12/04/15 0050)    Assessment: Patient with new onset afib.  Not on oral anticoagulant PTA.   Initial heparin level undetectable.  No bleeding noted.  Cardiology recommendations noted.  Patient will need Coumadin.  Baseline INR 1.25.    Goal of Therapy:  Heparin level 0.3-0.7 units/ml Monitor platelets by anticoagulation protocol: Yes   Plan:  Repeat Heparin bolus 2000 units iv x1 Increase Heparin drip to 1000 units/hr Daily CBC Next heparin level at 2000 Coumadin 5mg  po x1 today Daily INR Initiate Coumadin education  Netta Cedars, PharmD, BCPS Pager: [phone] 12/04/2015,11:32 AM  Addendum:   Educated patient on Coumadin.  He expressed being anxious about starting new medication and would like more time to read over education materials  and discuss with MD prior to starting medication.  He does not wish to receive medication today.  Per patient request, will hold Coumadin dose today and follow-up with patient tomorrow to answer further questions.  Continue IV heparin.   Netta Cedars, PharmD, BCPS Pager: [phone] 12/04/2015@2 :11 PM

## 2015-12-04 NOTE — Consult Note (Signed)
Cardiologist:  Meda Coffee Reason for Consult:  Atrial Fib Referring Physician:   WALFRED BETTENDORF is an 65 y.o. male.  HPI:       The patient is a 65 yo with history of hypertension, severe mitral regurgitation secondary to prolapse, PVCs.  An echocardiogram December 2016 revealing moderate LVH, moderate prolapse and partially flail leaflet involving the posterior leaflet with severe regurgitation directed anteriorly. Moderate to severe left atrial dilation. He was evaluated by Dr. Roxy Manns suggested minimally invasive MV repair.  Or Holter monitor showing infrequent PVCs with no runs. 2 very short episodes of SVT longest lasting 7 beats.  Patient presents with a racing heart dyspnea and presyncope.  He reports feeling different for about a week.  Some dizziness and mild SOB more so in the last two days.  The patient currently denies nausea, vomiting, fever, chest pain, orthopnea, PND, cough, congestion, abdominal pain, hematochezia, melena, lower extremity edema, claudication.  Patient was prescribed losartan 25 mg back in December however he was reluctant to take it and never filled it.  He is supposed to follow up with Dr. Roxy Manns in May regarding the MV repair and with Dr. Meda Coffee in 10 days.     Past Medical History  Diagnosis Date  . Heart murmur   . Severe mitral regurgitation 07/30/2015  . MVP (mitral valve prolapse) 06/10/2015  . Abnormal EKG 06/10/2015  . PVC (premature ventricular contraction) 06/10/2015    Past Surgical History  Procedure Laterality Date  . Inguinal hernia repair      Family History  Problem Relation Age of Onset  . Hypertension Father   . Cancer Brother   . Hypertension Brother   . Hypertension Mother   . Parkinsonism Mother   . Hypertension Brother   . Hypertension Brother     Social History:  reports that he has never smoked. He does not have any smokeless tobacco history on file. He reports that he drinks alcohol. His drug history is not on  file.  Allergies: No Known Allergies  Medications:  Scheduled Meds: . antiseptic oral rinse  7 mL Mouth Rinse BID  . aspirin EC  81 mg Oral Daily  . azithromycin  250 mg Oral Daily  . cefTRIAXone (ROCEPHIN)  IV  1 g Intravenous Q24H  . cholecalciferol  5,000 Units Oral Daily  . vitamin B-12  100 mcg Oral Daily   Continuous Infusions: . diltiazem (CARDIZEM) infusion 15 mg/hr (12/04/15 0155)  . heparin 800 Units/hr (12/04/15 0050)   PRN Meds:.acetaminophen, dextromethorphan, diltiazem, ondansetron (ZOFRAN) IV   Results for orders placed or performed during the hospital encounter of 12/03/15 (from the past 48 hour(s))  Basic metabolic panel     Status: Abnormal   Collection Time: 12/03/15  4:44 PM  Result Value Ref Range   Sodium 139 135 - 145 mmol/L   Potassium 3.7 3.5 - 5.1 mmol/L   Chloride 99 (L) 101 - 111 mmol/L   CO2 26 22 - 32 mmol/L   Glucose, Bld 141 (H) 65 - 99 mg/dL   BUN 70 (H) 6 - 20 mg/dL   Creatinine, Ser 1.43 (H) 0.61 - 1.24 mg/dL   Calcium 9.1 8.9 - 10.3 mg/dL   GFR calc non Af Amer 50 (L) >60 mL/min   GFR calc Af Amer 57 (L) >60 mL/min    Comment: (NOTE) The eGFR has been calculated using the CKD EPI equation. This calculation has not been validated in all clinical situations. eGFR's persistently <60 mL/min  signify possible Chronic Kidney Disease.    Anion gap 14 5 - 15  CBC     Status: Abnormal   Collection Time: 12/03/15  4:44 PM  Result Value Ref Range   WBC 21.1 (H) 4.0 - 10.5 K/uL   RBC 4.31 4.22 - 5.81 MIL/uL   Hemoglobin 13.8 13.0 - 17.0 g/dL   HCT 40.0 39.0 - 52.0 %   MCV 92.8 78.0 - 100.0 fL   MCH 32.0 26.0 - 34.0 pg   MCHC 34.5 30.0 - 36.0 g/dL   RDW 12.4 11.5 - 15.5 %   Platelets 466 (H) 150 - 400 K/uL  CBG monitoring, ED     Status: Abnormal   Collection Time: 12/03/15  4:52 PM  Result Value Ref Range   Glucose-Capillary 125 (H) 65 - 99 mg/dL  I-stat troponin, ED (not at [facility], [facility])     Status: None   Collection Time: 12/03/15  5:01  PM  Result Value Ref Range   Troponin i, poc 0.04 0.00 - 0.08 ng/mL   Comment 3            Comment: Due to the release kinetics of cTnI, a negative result within the first hours of the onset of symptoms does not rule out myocardial infarction with certainty. If myocardial infarction is still suspected, repeat the test at appropriate intervals.   Urinalysis, Routine w reflex microscopic (not at [facility])     Status: Abnormal   Collection Time: 12/03/15  8:21 PM  Result Value Ref Range   Color, Urine AMBER (A) YELLOW    Comment: BIOCHEMICALS MAY BE AFFECTED BY COLOR   APPearance CLOUDY (A) CLEAR   Specific Gravity, Urine 1.019 1.005 - 1.030   pH 5.5 5.0 - 8.0   Glucose, UA NEGATIVE NEGATIVE mg/dL   Hgb urine dipstick MODERATE (A) NEGATIVE   Bilirubin Urine NEGATIVE NEGATIVE   Ketones, ur NEGATIVE NEGATIVE mg/dL   Protein, ur 30 (A) NEGATIVE mg/dL   Nitrite NEGATIVE NEGATIVE   Leukocytes, UA SMALL (A) NEGATIVE  Urine microscopic-add on     Status: Abnormal   Collection Time: 12/03/15  8:21 PM  Result Value Ref Range   Squamous Epithelial / LPF 0-5 (A) NONE SEEN   WBC, UA 6-30 0 - 5 WBC/hpf   RBC / HPF 6-30 0 - 5 RBC/hpf   Bacteria, UA FEW (A) NONE SEEN   Casts HYALINE CASTS (A) NEGATIVE   Urine-Other MUCOUS PRESENT   TSH     Status: None   Collection Time: 12/04/15 12:01 AM  Result Value Ref Range   TSH 0.387 0.350 - 4.500 uIU/mL  T4, free     Status: Abnormal   Collection Time: 12/04/15 12:01 AM  Result Value Ref Range   Free T4 1.40 (H) 0.61 - 1.12 ng/dL    Comment: Performed at [facility]  Magnesium     Status: None   Collection Time: 12/04/15 12:01 AM  Result Value Ref Range   Magnesium 2.2 1.7 - 2.4 mg/dL  Protime-INR     Status: Abnormal   Collection Time: 12/04/15 12:01 AM  Result Value Ref Range   Prothrombin Time 15.8 (H) 11.6 - 15.2 seconds   INR 1.25 0.00 - 1.49  APTT     Status: None   Collection Time: 12/04/15 12:01 AM  Result Value Ref Range    aPTT 29 24 - 37 seconds  Procalcitonin - Baseline     Status: None   Collection Time: 12/04/15  12:01 AM  Result Value Ref Range   Procalcitonin <0.10 ng/mL    Comment:        Interpretation: PCT (Procalcitonin) <= 0.5 ng/mL: Systemic infection (sepsis) is not likely. Local bacterial infection is possible. (NOTE)         ICU PCT Algorithm               Non ICU PCT Algorithm    ----------------------------     ------------------------------         PCT < 0.25 ng/mL                 PCT < 0.1 ng/mL     Stopping of antibiotics            Stopping of antibiotics       strongly encouraged.               strongly encouraged.    ----------------------------     ------------------------------       PCT level decrease by               PCT < 0.25 ng/mL       >= 80% from peak PCT       OR PCT 0.25 - 0.5 ng/mL          Stopping of antibiotics                                             encouraged.     Stopping of antibiotics           encouraged.    ----------------------------     ------------------------------       PCT level decrease by              PCT >= 0.25 ng/mL       < 80% from peak PCT        AND PCT >= 0.5 ng/mL            Continuin g antibiotics                                              encouraged.       Continuing antibiotics            encouraged.    ----------------------------     ------------------------------     PCT level increase compared          PCT > 0.5 ng/mL         with peak PCT AND          PCT >= 0.5 ng/mL             Escalation of antibiotics                                          strongly encouraged.      Escalation of antibiotics        strongly encouraged.   MRSA PCR Screening     Status: None   Collection Time: 12/04/15  1:49 AM  Result Value Ref Range   MRSA by PCR NEGATIVE NEGATIVE    Comment:  The GeneXpert MRSA Assay (FDA approved for NASAL specimens only), is one component of a comprehensive MRSA colonization surveillance program. It  is not intended to diagnose MRSA infection nor to guide or monitor treatment for MRSA infections.   Basic metabolic panel     Status: Abnormal   Collection Time: 12/04/15  3:12 AM  Result Value Ref Range   Sodium 137 135 - 145 mmol/L   Potassium 3.6 3.5 - 5.1 mmol/L   Chloride 102 101 - 111 mmol/L   CO2 25 22 - 32 mmol/L   Glucose, Bld 113 (H) 65 - 99 mg/dL   BUN 56 (H) 6 - 20 mg/dL   Creatinine, Ser 0.92 0.61 - 1.24 mg/dL   Calcium 8.4 (L) 8.9 - 10.3 mg/dL   GFR calc non Af Amer >60 >60 mL/min   GFR calc Af Amer >60 >60 mL/min    Comment: (NOTE) The eGFR has been calculated using the CKD EPI equation. This calculation has not been validated in all clinical situations. eGFR's persistently <60 mL/min signify possible Chronic Kidney Disease.    Anion gap 10 5 - 15  CBC WITH DIFFERENTIAL     Status: Abnormal   Collection Time: 12/04/15  3:12 AM  Result Value Ref Range   WBC 14.0 (H) 4.0 - 10.5 K/uL   RBC 3.88 (L) 4.22 - 5.81 MIL/uL   Hemoglobin 11.8 (L) 13.0 - 17.0 g/dL   HCT 35.0 (L) 39.0 - 52.0 %   MCV 90.2 78.0 - 100.0 fL   MCH 30.4 26.0 - 34.0 pg   MCHC 33.7 30.0 - 36.0 g/dL   RDW 12.5 11.5 - 15.5 %   Platelets 298 150 - 400 K/uL   Neutrophils Relative % 80 %   Neutro Abs 11.1 (H) 1.7 - 7.7 K/uL   Lymphocytes Relative 13 %   Lymphs Abs 1.9 0.7 - 4.0 K/uL   Monocytes Relative 7 %   Monocytes Absolute 1.0 0.1 - 1.0 K/uL   Eosinophils Relative 0 %   Eosinophils Absolute 0.0 0.0 - 0.7 K/uL   Basophils Relative 0 %   Basophils Absolute 0.0 0.0 - 0.1 K/uL  Lipid panel     Status: Abnormal   Collection Time: 12/04/15  3:12 AM  Result Value Ref Range   Cholesterol 138 0 - 200 mg/dL   Triglycerides 96 <150 mg/dL   HDL 24 (L) >40 mg/dL   Total CHOL/HDL Ratio 5.8 RATIO   VLDL 19 0 - 40 mg/dL   LDL Cholesterol 95 0 - 99 mg/dL    Comment:        Total Cholesterol/HDL:CHD Risk Coronary Heart Disease Risk Table                     Men   Women  1/2 Average Risk   3.4    3.3  Average Risk       5.0   4.4  2 X Average Risk   9.6   7.1  3 X Average Risk  23.4   11.0        Use the calculated Patient Ratio above and the CHD Risk Table to determine the patient's CHD Risk.        ATP III CLASSIFICATION (LDL):  <100     mg/dL   Optimal  100-129  mg/dL   Near or Above                    Optimal  130-159  mg/dL   Borderline  160-189  mg/dL   High  >190     mg/dL   Very High Performed at [facility]     Dg Chest Port 1 View  12/03/2015  CLINICAL DATA:  Nasal and chest congestion EXAM: PORTABLE CHEST 1 VIEW COMPARISON:  None. FINDINGS: There is mild opacity in the right base compared to the left. No other pulmonary opacities. No pneumothorax. No pulmonary nodules or masses. Mild cardiomegaly. The hila and mediastinum are normal. IMPRESSION: Right lower lobe infiltrate.  Recommend follow-up to resolution. Electronically Signed   By: Dorise Bullion III M.D   On: 12/03/2015 16:39    Review of Systems  Constitutional: Negative for fever and diaphoresis.  HENT: Negative for sore throat.   Respiratory: Positive for shortness of breath. Negative for cough.   Cardiovascular: Negative for chest pain, palpitations, orthopnea, claudication, leg swelling and PND.  Gastrointestinal: Negative for nausea, vomiting, abdominal pain, blood in stool and melena.  Genitourinary: Negative for hematuria.  Musculoskeletal: Negative for myalgias.  Neurological: Positive for dizziness. Negative for weakness.  All other systems reviewed and are negative.  Blood pressure 120/86, pulse 74, temperature 98.2 F (36.8 C), temperature source Oral, resp. rate 14, height '6\' 1"'$  (1.854 m), weight 144 lb 6.4 oz (65.5 kg), SpO2 97 %. Physical Exam  Nursing note and vitals reviewed.   Well nourished, well developed, in no acute distress HEENT: Pupils are equal round react to light accommodation extraocular movements are intact.  Neck: no JVDNo cervical lymphadenopathy. Cardiac:  Irregular rate and rhythm with 3/6 sys MM at apex and axillae. Lungs:  clear to auscultation bilaterally, no wheezing, rhonchi or rales Abd: soft, nontender, positive bowel sounds all quadrants, no hepatosplenomegaly Ext: no lower extremity edema.  2+ radial and dorsalis pedis pulses. Skin: warm and dry Neuro:  Grossly normal   Assessment/Plan:    Atrial fibrillation with RVR (HCC) On cardizem at '15mg'$ /hr.  Rate 100-115.  IV heparin.  CHADSVASC 2(age, HTN).  Will need to be on coumadin given MV prolapse.  With his severely dilated LA it will make it more difficult to undergo a successful cardioversion.   He may need antiarrhythmic medication.  His EF is normal.  Consider adding Multag before then TEE/DCCV.   BP may tolerate the addition of metoprolol as well.  It may be that his pneumonia was enough to trigger the afib.      MVP (mitral valve prolapse)   Severe mitral regurgitation  Followed by Dr. Roxy Manns.  Appears stable.  I do not think CTS eval is needed now but sooner than May.   Consider MAZE procedure during surgery.   Hyperlipidemia   Hypertensive heart disease   AKI (acute kidney injury) ([place])  resolved   CAP (community acquired pneumonia)   Thrombocytosis ([place])  resolved    HAGER, BRYAN, [place] 12/04/2015, 10:01 AM    Attending Note:   The patient was seen and examined.  Agree with assessment and plan as noted above.  Changes made to the above note as needed.  Pt has a long hx of MVP with severe MR. He is now admitted with pneumonia and atrial fib. Rate is better but still a little fast. He is more short of breath but is very stable  He should be adequately treated for this pneumonia and then get back to Dr. Meda Coffee for his appt. In 10 days. Will need coumadin We can consider cardioversion but I doubt that he will hold sinus  rhythm He needs MV repair with MAZE procedure for definitive treatment .   Thayer Headings, Brooke [place]ito., MD, [facility] 12/04/2015, 11:49 AM [address],  [place] Pager [phone]

## 2015-12-05 LAB — BASIC METABOLIC PANEL
Anion gap: 8 (ref 5–15)
BUN: 38 mg/dL — ABNORMAL HIGH (ref 6–20)
CALCIUM: 8.3 mg/dL — AB (ref 8.9–10.3)
CHLORIDE: 103 mmol/L (ref 101–111)
CO2: 23 mmol/L (ref 22–32)
CREATININE: 0.76 mg/dL (ref 0.61–1.24)
GFR calc Af Amer: >60 mL/min (ref >60)
GFR calc non Af Amer: >60 mL/min (ref >60)
GLUCOSE: 114 mg/dL — AB (ref 65–99)
Potassium: 3.2 mmol/L — ABNORMAL LOW (ref 3.5–5.1)
Sodium: 134 mmol/L — ABNORMAL LOW (ref 135–145)

## 2015-12-05 LAB — PROTIME-INR
INR: 1.16 (ref 0.00–1.49)
Prothrombin Time: 15 seconds (ref 11.6–15.2)

## 2015-12-05 LAB — MAGNESIUM: Magnesium: 1.8 mg/dL (ref 1.7–2.4)

## 2015-12-05 LAB — HIV ANTIBODY (ROUTINE TESTING W REFLEX): HIV Screen 4th Generation wRfx: NONREACTIVE

## 2015-12-05 LAB — STREP PNEUMONIAE URINARY ANTIGEN: Strep Pneumo Urinary Antigen: NEGATIVE

## 2015-12-05 LAB — CBC
HEMATOCRIT: 34.5 % — AB (ref 39.0–52.0)
HEMOGLOBIN: 12 g/dL — AB (ref 13.0–17.0)
MCH: 31.1 pg (ref 26.0–34.0)
MCHC: 34.8 g/dL (ref 30.0–36.0)
MCV: 89.4 fL (ref 78.0–100.0)
Platelets: 313 10*3/uL (ref 150–400)
RBC: 3.86 MIL/uL — ABNORMAL LOW (ref 4.22–5.81)
RDW: 12.4 % (ref 11.5–15.5)
WBC: 15.2 10*3/uL — ABNORMAL HIGH (ref 4.0–10.5)

## 2015-12-05 LAB — PROCALCITONIN: Procalcitonin: <0.1 ng/mL

## 2015-12-05 LAB — HEPARIN LEVEL (UNFRACTIONATED)
HEPARIN UNFRACTIONATED: 0.26 [IU]/mL — AB (ref 0.30–0.70)
HEPARIN UNFRACTIONATED: 0.29 [IU]/mL — AB (ref 0.30–0.70)
Heparin Unfractionated: 0.22 IU/mL — ABNORMAL LOW (ref 0.30–0.70)

## 2015-12-05 MED ORDER — DILTIAZEM HCL 60 MG PO TABS
60.0000 mg | ORAL_TABLET | Freq: Four times a day (QID) | ORAL | Status: DC
  Administered 2015-12-05 – 2015-12-06 (×5): 60 mg via ORAL
  Filled 2015-12-05 (×5): qty 1

## 2015-12-05 MED ORDER — WARFARIN SODIUM 5 MG PO TABS
5.0000 mg | ORAL_TABLET | Freq: Once | ORAL | Status: AC
  Administered 2015-12-05: 5 mg via ORAL
  Filled 2015-12-05: qty 1

## 2015-12-05 MED ORDER — MAGNESIUM SULFATE IN D5W 10-5 MG/ML-% IV SOLN
1.0000 g | Freq: Once | INTRAVENOUS | Status: AC
  Administered 2015-12-05: 1 g via INTRAVENOUS
  Filled 2015-12-05: qty 100

## 2015-12-05 MED ORDER — POTASSIUM CHLORIDE CRYS ER 20 MEQ PO TBCR
40.0000 meq | EXTENDED_RELEASE_TABLET | ORAL | Status: AC
  Administered 2015-12-05 (×2): 40 meq via ORAL
  Filled 2015-12-05 (×2): qty 2

## 2015-12-05 NOTE — Plan of Care (Signed)
Problem: Health Behavior/Discharge Planning: Goal: Ability to manage health-related needs will improve Outcome: Progressing Patient is more opening to discussing medications to treat A-fib. He is reading the booklet on coumadin provided by pharmacy.  Problem: Activity: Goal: Risk for activity intolerance will decrease Outcome: Not Progressing Patient is dizzy upon standing. Will continue to try to mobilize him.  Problem: Education: Goal: Knowledge of disease or condition will improve Outcome: Progressing RN discussed purpose of blood thinners with patient.

## 2015-12-05 NOTE — Progress Notes (Signed)
SUBJECTIVE:  He is somewhat fatigued this morning.     PHYSICAL EXAM Filed Vitals:   12/05/15 0200 12/05/15 0300 12/05/15 0400 12/05/15 0600  BP: 144/75 154/97 129/82 116/98  Pulse: 108 125  100  Temp:   98.4 F (36.9 C)   TempSrc:      Resp: 22 24 19 20   Height:      Weight:   149 lb 11.1 oz (67.9 kg)   SpO2: 96% 97% 95% 96%   General:  No distress Lungs:  Clear Heart:  Irregular, 3/6 systolic murmur Abdomen:  Positive bowel sounds, no rebound no guarding Extremities:  No edema  LABS: No results found for: TROPONINI Results for orders placed or performed during the hospital encounter of 12/03/15 (from the past 24 hour(s))  Heparin level (unfractionated)     Status: Abnormal   Collection Time: 12/04/15 10:01 AM  Result Value Ref Range   Heparin Unfractionated <0.10 (L) 0.30 - 0.70 IU/mL  Culture, expectorated sputum-assessment     Status: None   Collection Time: 12/04/15 10:45 AM  Result Value Ref Range   Specimen Description SPUTUM    Special Requests NONE    Sputum evaluation      THIS SPECIMEN IS ACCEPTABLE. RESPIRATORY CULTURE REPORT TO FOLLOW.   Report Status 12/04/2015 FINAL   Heparin level (unfractionated)     Status: Abnormal   Collection Time: 12/04/15  8:45 PM  Result Value Ref Range   Heparin Unfractionated <0.10 (L) 0.30 - 0.70 IU/mL  Procalcitonin     Status: None   Collection Time: 12/05/15  3:56 AM  Result Value Ref Range   Procalcitonin <0.10 ng/mL  CBC     Status: Abnormal   Collection Time: 12/05/15  3:56 AM  Result Value Ref Range   WBC 15.2 (H) 4.0 - 10.5 K/uL   RBC 3.86 (L) 4.22 - 5.81 MIL/uL   Hemoglobin 12.0 (L) 13.0 - 17.0 g/dL   HCT 34.5 (L) 39.0 - 52.0 %   MCV 89.4 78.0 - 100.0 fL   MCH 31.1 26.0 - 34.0 pg   MCHC 34.8 30.0 - 36.0 g/dL   RDW 12.4 11.5 - 15.5 %   Platelets 313 150 - 400 K/uL  Basic metabolic panel     Status: Abnormal   Collection Time: 12/05/15  3:56 AM  Result Value Ref Range   Sodium 134 (L) 135 - 145 mmol/L    Potassium 3.2 (L) 3.5 - 5.1 mmol/L   Chloride 103 101 - 111 mmol/L   CO2 23 22 - 32 mmol/L   Glucose, Bld 114 (H) 65 - 99 mg/dL   BUN 38 (H) 6 - 20 mg/dL   Creatinine, Ser 0.76 0.61 - 1.24 mg/dL   Calcium 8.3 (L) 8.9 - 10.3 mg/dL   GFR calc non Af Amer >60 >60 mL/min   GFR calc Af Amer >60 >60 mL/min   Anion gap 8 5 - 15  Magnesium     Status: None   Collection Time: 12/05/15  3:56 AM  Result Value Ref Range   Magnesium 1.8 1.7 - 2.4 mg/dL  Protime-INR     Status: None   Collection Time: 12/05/15  3:56 AM  Result Value Ref Range   Prothrombin Time 15.0 11.6 - 15.2 seconds   INR 1.16 0.00 - 1.49    Intake/Output Summary (Last 24 hours) at 12/05/15 0725 Last data filed at 12/05/15 0600  Gross per 24 hour  Intake 683.91 ml  Output  1350 ml  Net -666.09 ml     ASSESSMENT AND PLAN:  MVP/MR:    Plan repair in the future.    ATRIAL FIB:   Continue heparin.  On warfarin per pharmacy.   He is off of IV Dilt.  I will change to PO.    PNEUMONIA:  Per primary team  Minus Breeding 12/05/2015 7:25 AM

## 2015-12-05 NOTE — Progress Notes (Signed)
Patient Demographics  Brian Martinez, is a 65 y.o. male, DOB - [birth year], [id number]  Admit date - 12/03/2015   Admitting Physician Vianne Bulls, MD  Outpatient Primary MD for the patient is Dorothy Spark, MD  LOS - 2   Chief Complaint  Patient presents with  . URI  . Near Syncope       Admission HPI/Brief narrative: 65 yo with history of hypertension, severe mitral regurgitation secondary to prolapse, PVCs, Presents with palpitation, dyspnea and presyncope, in ED workup significant for CAP, and new onset A. fib with RVR.   Subjective:   Brian Martinez today has, No headache, No chest pain, No abdominal pain - Reports dyspnea is improving, Cough is improving, currently dry.  Assessment & Plan    Principal Problem:   Atrial fibrillation with RVR (HCC) Active Problems:   MVP (mitral valve prolapse)   Severe mitral regurgitation   Hyperlipidemia   Hypertensive heart disease   AKI (acute kidney injury) ([place])   CAP (community acquired pneumonia)   Thrombocytosis (HCC)  Atrial fibrillation with RVR  - CHADS-VASc score is 2 for age >40 and HTN  - TTE (07/30/2015) with severe LA dilation, suggesting this it was  chronic  - Initially on Cardizem drip, currently transitioned to by mouth Cardizem, management per cardiology - Monitor on telemetry  - Currently on heparin GTT, pharmacy to dose warfarin, will need to be on warfarin given his MV prolapse.  CAP  - RLL infiltrate noted on CXR - Leukocytosis of 21k on admission , improving. - Continue with Rocephin and azithromycin . - Follow-up imaging recommended to exclude underlying mass   AKI  - SCr 1.43 on admission, up from 0.74 at last check 6 months  prior  - Improving with IV fluids   Hypertension  - At the lower end of normal currently  - Previously on Losartan, but pt stopped taking  - Monitoring for now, will  treat prn   Thrombocytosis  - Platelet count 466,000 on admission  - Likely representing an acute phase reactant in setting of PNA and new-onset a fib RVR  - Resolved  MVP with flail leaflet and severe MR  - Demonstrated on TTE (07/30/15)  - Has consulted with Dr. Roxy Manns of [place] and is planning tentatively for operative repair   Code Status: Full  Family Communication: None at bedside  Disposition Plan: Hopefully can transfer to telemetry heart rate remains controlled.   Procedures  None   Consults   Cardiology   Medications  Scheduled Meds: . antiseptic oral rinse  7 mL Mouth Rinse BID  . aspirin EC  81 mg Oral Daily  . azithromycin  250 mg Oral Daily  . cefTRIAXone (ROCEPHIN)  IV  1 g Intravenous Q24H  . cholecalciferol  5,000 Units Oral Daily  . diltiazem  60 mg Oral 4 times per day  . guaiFENesin  1,200 mg Oral BID  . potassium chloride  40 mEq Oral Q4H  . vitamin B-12  100 mcg Oral Daily  . warfarin  5 mg Oral ONCE-1800  . warfarin   Does not apply Once  . Warfarin - Pharmacist Dosing Inpatient   Does not apply q1800   Continuous Infusions: . heparin 1,400  Units/hr (12/05/15 0839)   PRN Meds:.acetaminophen, dextromethorphan, diltiazem, ondansetron (ZOFRAN) IV  DVT Prophylaxis  Heparin GTT  Lab Results  Component Value Date   PLT 313 12/05/2015    Antibiotics    Anti-infectives    Start     Dose/Rate Route Frequency Ordered Stop   12/04/15 2000  cefTRIAXone (ROCEPHIN) 1 g in dextrose 5 % 50 mL IVPB     1 g 100 mL/hr over 30 Minutes Intravenous Every 24 hours 12/03/15 2353 12/11/15 1959   12/04/15 1000  azithromycin (ZITHROMAX) tablet 250 mg     250 mg Oral Daily 12/03/15 2128 12/08/15 0959   12/03/15 2130  cefTRIAXone (ROCEPHIN) 1 g in dextrose 5 % 50 mL IVPB     1 g 100 mL/hr over 30 Minutes Intravenous  Once 12/03/15 2128 12/03/15 2229   12/03/15 2130  azithromycin (ZITHROMAX) tablet 500 mg     500 mg Oral Daily 12/03/15 2128 12/03/15 2152           Objective:   Filed Vitals:   12/05/15 0400 12/05/15 0600 12/05/15 0800 12/05/15 0837  BP: 129/82 116/98  145/90  Pulse:  100    Temp: 98.4 F (36.9 C)  97.7 F (36.5 C)   TempSrc:   Oral   Resp: 19 20    Height:      Weight: 67.9 kg (149 lb 11.1 oz)     SpO2: 95% 96%      Wt Readings from Last 3 Encounters:  12/05/15 67.9 kg (149 lb 11.1 oz)  09/04/15 71.215 kg (157 lb)  08/11/15 71.668 kg (158 lb)     Intake/Output Summary (Last 24 hours) at 12/05/15 1146 Last data filed at 12/05/15 0600  Gross per 24 hour  Intake 542.58 ml  Output   1350 ml  Net -807.42 ml     Physical Exam  Awake Alert, Oriented X 3, No new F.N deficits, Normal affect [place].AT,PERRAL Supple Neck,No JVD, No cervical lymphadenopathy appriciated.  Symmetrical Chest wall movement, Good air movement bilaterally, No wheezing Irregular,No Gallops,Rubs, murmur +, No Parasternal Heave +ve B.Sounds, Abd Soft, No tenderness, No organomegaly appriciated, No rebound - guarding or rigidity. No Cyanosis, Clubbing or edema, No new Rash or bruise     Data Review   Micro Results Recent Results (from the past 240 hour(s))  MRSA PCR Screening     Status: None   Collection Time: 12/04/15  1:49 AM  Result Value Ref Range Status   MRSA by PCR NEGATIVE NEGATIVE Final    Comment:        The GeneXpert MRSA Assay (FDA approved for NASAL specimens only), is one component of a comprehensive MRSA colonization surveillance program. It is not intended to diagnose MRSA infection nor to guide or monitor treatment for MRSA infections.   Culture, expectorated sputum-assessment     Status: None   Collection Time: 12/04/15 10:45 AM  Result Value Ref Range Status   Specimen Description SPUTUM  Final   Special Requests NONE  Final   Sputum evaluation   Final    THIS SPECIMEN IS ACCEPTABLE. RESPIRATORY CULTURE REPORT TO FOLLOW.   Report Status 12/04/2015 FINAL  Final    Radiology Reports Dg Chest Port  1 View  12/03/2015  CLINICAL DATA:  Nasal and chest congestion EXAM: PORTABLE CHEST 1 VIEW COMPARISON:  None. FINDINGS: There is mild opacity in the right base compared to the left. No other pulmonary opacities. No pneumothorax. No pulmonary nodules or masses. Mild cardiomegaly.  The hila and mediastinum are normal. IMPRESSION: Right lower lobe infiltrate.  Recommend follow-up to resolution. Electronically Signed   By: Dorise Bullion III M.D   On: 12/03/2015 16:39     CBC  Recent Labs Lab 12/03/15 1644 12/04/15 0312 12/05/15 0356  WBC 21.1* 14.0* 15.2*  HGB 13.8 11.8* 12.0*  HCT 40.0 35.0* 34.5*  PLT 466* 298 313  MCV 92.8 90.2 89.4  MCH 32.0 30.4 31.1  MCHC 34.5 33.7 34.8  RDW 12.4 12.5 12.4  LYMPHSABS  --  1.9  --   MONOABS  --  1.0  --   EOSABS  --  0.0  --   BASOSABS  --  0.0  --     Chemistries   Recent Labs Lab 12/03/15 1644 12/04/15 0001 12/04/15 0312 12/05/15 0356  NA 139  --  137 134*  K 3.7  --  3.6 3.2*  CL 99*  --  102 103  CO2 26  --  25 23  GLUCOSE 141*  --  113* 114*  BUN 70*  --  56* 38*  CREATININE 1.43*  --  0.92 0.76  CALCIUM 9.1  --  8.4* 8.3*  MG  --  2.2  --  1.8   ------------------------------------------------------------------------------------------------------------------ estimated creatinine clearance is 87.2 mL/min (by C-G formula based on Cr of 0.76). ------------------------------------------------------------------------------------------------------------------ No results for input(s): HGBA1C in the last 72 hours. ------------------------------------------------------------------------------------------------------------------  Recent Labs  12/04/15 0312  CHOL 138  HDL 24*  LDLCALC 95  TRIG 96  CHOLHDL 5.8   ------------------------------------------------------------------------------------------------------------------  Recent Labs  12/04/15 0001  TSH 0.387    ------------------------------------------------------------------------------------------------------------------ No results for input(s): VITAMINB12, FOLATE, FERRITIN, TIBC, IRON, RETICCTPCT in the last 72 hours.  Coagulation profile  Recent Labs Lab 12/04/15 0001 12/05/15 0356  INR 1.25 1.16    No results for input(s): DDIMER in the last 72 hours.  Cardiac Enzymes No results for input(s): CKMB, TROPONINI, MYOGLOBIN in the last 168 hours.  Invalid input(s): CK ------------------------------------------------------------------------------------------------------------------ Invalid input(s): POCBNP     Time Spent in minutes   30 minutes   Gurnoor Ursua M.D on 12/05/2015 at 11:46 AM  Between 7am to 7pm - Pager - [phone]  After 7pm go to [email or web address] - password [facility]  [facility]   Office  [phone]

## 2015-12-06 DIAGNOSIS — I10 Essential (primary) hypertension: Secondary | ICD-10-CM

## 2015-12-06 LAB — BASIC METABOLIC PANEL
Anion gap: 9 (ref 5–15)
BUN: 30 mg/dL — ABNORMAL HIGH (ref 6–20)
CHLORIDE: 105 mmol/L (ref 101–111)
CO2: 23 mmol/L (ref 22–32)
Calcium: 8.3 mg/dL — ABNORMAL LOW (ref 8.9–10.3)
Creatinine, Ser: 0.7 mg/dL (ref 0.61–1.24)
GFR calc Af Amer: >60 mL/min (ref >60)
GFR calc non Af Amer: >60 mL/min (ref >60)
Glucose, Bld: 118 mg/dL — ABNORMAL HIGH (ref 65–99)
Potassium: 3.7 mmol/L (ref 3.5–5.1)
SODIUM: 137 mmol/L (ref 135–145)

## 2015-12-06 LAB — HEPARIN LEVEL (UNFRACTIONATED)
HEPARIN UNFRACTIONATED: 0.51 [IU]/mL (ref 0.30–0.70)
Heparin Unfractionated: 0.53 IU/mL (ref 0.30–0.70)

## 2015-12-06 LAB — CBC
HEMATOCRIT: 36 % — AB (ref 39.0–52.0)
HEMOGLOBIN: 12.6 g/dL — AB (ref 13.0–17.0)
MCH: 31.4 pg (ref 26.0–34.0)
MCHC: 35 g/dL (ref 30.0–36.0)
MCV: 89.8 fL (ref 78.0–100.0)
Platelets: 419 10*3/uL — ABNORMAL HIGH (ref 150–400)
RBC: 4.01 MIL/uL — ABNORMAL LOW (ref 4.22–5.81)
RDW: 12.2 % (ref 11.5–15.5)
WBC: 13.3 10*3/uL — AB (ref 4.0–10.5)

## 2015-12-06 LAB — PROTIME-INR
INR: 1.19 (ref 0.00–1.49)
PROTHROMBIN TIME: 15.3 s — AB (ref 11.6–15.2)

## 2015-12-06 MED ORDER — DILTIAZEM HCL ER COATED BEADS 180 MG PO CP24: 300.0000 mg | ORAL_CAPSULE | Freq: Every day | ORAL | Status: DC

## 2015-12-06 MED ORDER — WARFARIN SODIUM 5 MG PO TABS
5.0000 mg | ORAL_TABLET | Freq: Once | ORAL | Status: AC
Start: 2015-12-06 — End: 2015-12-06
  Administered 2015-12-06: 5 mg via ORAL
  Filled 2015-12-06: qty 1

## 2015-12-06 MED ORDER — POTASSIUM CHLORIDE CRYS ER 20 MEQ PO TBCR
40.0000 meq | EXTENDED_RELEASE_TABLET | Freq: Once | ORAL | Status: AC
  Administered 2015-12-06: 40 meq via ORAL
  Filled 2015-12-06: qty 2

## 2015-12-06 MED ORDER — METOPROLOL TARTRATE 25 MG PO TABS
25.0000 mg | ORAL_TABLET | Freq: Two times a day (BID) | ORAL | Status: DC
  Administered 2015-12-06 (×2): 25 mg via ORAL
  Filled 2015-12-06 (×2): qty 1

## 2015-12-06 MED ORDER — SODIUM CHLORIDE 0.9 % IV SOLN
INTRAVENOUS | Status: DC
  Administered 2015-12-06: 12:00:00 via INTRAVENOUS
  Administered 2015-12-07: 75 mL via INTRAVENOUS
  Administered 2015-12-07 – 2015-12-10 (×5): via INTRAVENOUS

## 2015-12-06 MED ORDER — DILTIAZEM HCL ER COATED BEADS 180 MG PO CP24
360.0000 mg | ORAL_CAPSULE | Freq: Every day | ORAL | Status: DC
  Administered 2015-12-06 – 2015-12-10 (×5): 360 mg via ORAL
  Filled 2015-12-06 (×5): qty 2

## 2015-12-06 NOTE — Progress Notes (Signed)
Patient Demographics  Brian Martinez, is a 65 y.o. male, DOB - 12-29-1950, [id number]  Admit date - 12/03/2015   Admitting Physician Vianne Bulls, MD  Outpatient Primary MD for the patient is Dorothy Spark, MD  LOS - 3   Chief Complaint  Patient presents with  . URI  . Near Syncope       Admission HPI/Brief narrative: 65 yo with history of hypertension, severe mitral regurgitation secondary to prolapse, PVCs, Presents with palpitation, dyspnea and presyncope, in ED workup significant for CAP, and new onset A. fib with RVR.   Subjective:   [name] today has, No headache, No chest pain, No abdominal pain - Reports dyspnea is improving, Cough is improving, currently dry.  Assessment & Plan    Principal Problem:   Atrial fibrillation with RVR (HCC) Active Problems:   MVP (mitral valve prolapse)   Severe mitral regurgitation   Hyperlipidemia   Hypertensive heart disease   AKI (acute kidney injury) ([place])   CAP (community acquired pneumonia)   Thrombocytosis (HCC)  Atrial fibrillation with RVR  - CHADS-VASc score is 2 for age >76 and HTN  - TTE (07/30/2015) with severe LA dilation, suggesting this it was  chronic  - Initially on Cardizem drip, currently transitioned to by mouth Cardizem, management per cardiology, heart rate remains elevated, will start on beta blocker. - Monitor on telemetry  - Currently on heparin GTT, started on warfarin, pharmacy to dose warfarin, will need to be on warfarin given his MV prolapse.  CAP  - RLL infiltrate noted on CXR - Leukocytosis of 21k on admission , ending down - Continue with Rocephin and azithromycin . - Follow-up imaging recommended to exclude underlying mass  . AKI  - SCr 1.43 on admission, up from 0.74 at last check 6 months  prior  - Improving with IV fluids   Hypertension  - Previously on Losartan, but pt  stopped taking  - Cardizem, Started on Metoprolol As Well  Thrombocytosis  - Platelet count 466,000 on admission  - Likely representing an acute phase reactant in setting of PNA and new-onset a fib RVR  - Resolved  MVP with flail leaflet and severe MR  - Demonstrated on TTE (07/30/15)  - Has consulted with Dr. Roxy Manns of [place] and is planning tentatively for operative repair   Hypokalemia - Related, monitor closely  Code Status: Full  Family Communication: None at bedside  Disposition Plan: Initial transfer order to telemetry as canceled given heart rate remains significantly elevated.   Procedures  None   Consults   Cardiology   Medications  Scheduled Meds: . antiseptic oral rinse  7 mL Mouth Rinse BID  . aspirin EC  81 mg Oral Daily  . azithromycin  250 mg Oral Daily  . cefTRIAXone (ROCEPHIN)  IV  1 g Intravenous Q24H  . cholecalciferol  5,000 Units Oral Daily  . diltiazem  360 mg Oral Daily  . guaiFENesin  1,200 mg Oral BID  . metoprolol tartrate  25 mg Oral BID  . potassium chloride  40 mEq Oral Once  . vitamin B-12  100 mcg Oral Daily  . warfarin  5 mg Oral ONCE-1800  . Warfarin - Pharmacist Dosing Inpatient   Does  not apply q1800   Continuous Infusions: . heparin 1,650 Units/hr (12/06/15 0700)   PRN Meds:.acetaminophen, dextromethorphan, diltiazem, ondansetron (ZOFRAN) IV  DVT Prophylaxis  Heparin GTT  Lab Results  Component Value Date   PLT 419* 12/06/2015    Antibiotics    Anti-infectives    Start     Dose/Rate Route Frequency Ordered Stop   12/04/15 2000  cefTRIAXone (ROCEPHIN) 1 g in dextrose 5 % 50 mL IVPB     1 g 100 mL/hr over 30 Minutes Intravenous Every 24 hours 12/03/15 2353 12/11/15 1959   12/04/15 1000  azithromycin (ZITHROMAX) tablet 250 mg     250 mg Oral Daily 12/03/15 2128 12/08/15 0959   12/03/15 2130  cefTRIAXone (ROCEPHIN) 1 g in dextrose 5 % 50 mL IVPB     1 g 100 mL/hr over 30 Minutes Intravenous  Once 12/03/15 2128  12/03/15 2229   12/03/15 2130  azithromycin (ZITHROMAX) tablet 500 mg     500 mg Oral Daily 12/03/15 2128 12/03/15 2152          Objective:   Filed Vitals:   12/06/15 0800 12/06/15 0900 12/06/15 1000 12/06/15 1100  BP: 130/70 117/81 114/74 124/75  Pulse: 116 33 66 90  Temp: 97.9 F (36.6 C)     TempSrc: Oral     Resp: 21 19 22 15   Height:      Weight:      SpO2: 94% 96% 95% 96%    Wt Readings from Last 3 Encounters:  12/06/15 67.5 kg (148 lb 13 oz)  09/04/15 71.215 kg (157 lb)  08/11/15 71.668 kg (158 lb)     Intake/Output Summary (Last 24 hours) at 12/06/15 1154 Last data filed at 12/06/15 0900  Gross per 24 hour  Intake 499.68 ml  Output    500 ml  Net  -0.32 ml     Physical Exam  Awake Alert, Oriented X 3, No new F.N deficits, Normal affect [place].AT,PERRAL Supple Neck,No JVD, No cervical lymphadenopathy appriciated.  Symmetrical Chest wall movement, Good air movement bilaterally, No wheezing Irregular,No Gallops,Rubs, murmur +, No Parasternal Heave +ve B.Sounds, Abd Soft, No tenderness, No organomegaly appriciated, No rebound - guarding or rigidity. No Cyanosis, Clubbing or edema, No new Rash or bruise     Data Review   Micro Results Recent Results (from the past 240 hour(s))  MRSA PCR Screening     Status: None   Collection Time: 12/04/15  1:49 AM  Result Value Ref Range Status   MRSA by PCR NEGATIVE NEGATIVE Final    Comment:        The GeneXpert MRSA Assay (FDA approved for NASAL specimens only), is one component of a comprehensive MRSA colonization surveillance program. It is not intended to diagnose MRSA infection nor to guide or monitor treatment for MRSA infections.   Culture, expectorated sputum-assessment     Status: None   Collection Time: 12/04/15 10:45 AM  Result Value Ref Range Status   Specimen Description SPUTUM  Final   Special Requests NONE  Final   Sputum evaluation   Final    THIS SPECIMEN IS ACCEPTABLE. RESPIRATORY  CULTURE REPORT TO FOLLOW.   Report Status 12/04/2015 FINAL  Final    Radiology Reports Dg Chest Port 1 View  12/03/2015  CLINICAL DATA:  Nasal and chest congestion EXAM: PORTABLE CHEST 1 VIEW COMPARISON:  None. FINDINGS: There is mild opacity in the right base compared to the left. No other pulmonary opacities. No pneumothorax. No pulmonary nodules  or masses. Mild cardiomegaly. The hila and mediastinum are normal. IMPRESSION: Right lower lobe infiltrate.  Recommend follow-up to resolution. Electronically Signed   By: Dorise Bullion III M.D   On: 12/03/2015 16:39     CBC  Recent Labs Lab 12/03/15 1644 12/04/15 0312 12/05/15 0356 12/06/15 0408  WBC 21.1* 14.0* 15.2* 13.3*  HGB 13.8 11.8* 12.0* 12.6*  HCT 40.0 35.0* 34.5* 36.0*  PLT 466* 298 313 419*  MCV 92.8 90.2 89.4 89.8  MCH 32.0 30.4 31.1 31.4  MCHC 34.5 33.7 34.8 35.0  RDW 12.4 12.5 12.4 12.2  LYMPHSABS  --  1.9  --   --   MONOABS  --  1.0  --   --   EOSABS  --  0.0  --   --   BASOSABS  --  0.0  --   --     Chemistries   Recent Labs Lab 12/03/15 1644 12/04/15 0001 12/04/15 0312 12/05/15 0356 12/06/15 0408  NA 139  --  137 134* 137  K 3.7  --  3.6 3.2* 3.7  CL 99*  --  102 103 105  CO2 26  --  25 23 23   GLUCOSE 141*  --  113* 114* 118*  BUN 70*  --  56* 38* 30*  CREATININE 1.43*  --  0.92 0.76 0.70  CALCIUM 9.1  --  8.4* 8.3* 8.3*  MG  --  2.2  --  1.8  --    ------------------------------------------------------------------------------------------------------------------ estimated creatinine clearance is 86.7 mL/min (by C-G formula based on Cr of 0.7). ------------------------------------------------------------------------------------------------------------------ No results for input(s): HGBA1C in the last 72 hours. ------------------------------------------------------------------------------------------------------------------  Recent Labs  12/04/15 0312  CHOL 138  HDL 24*  LDLCALC 95  TRIG 96   CHOLHDL 5.8   ------------------------------------------------------------------------------------------------------------------  Recent Labs  12/04/15 0001  TSH 0.387   ------------------------------------------------------------------------------------------------------------------ No results for input(s): VITAMINB12, FOLATE, FERRITIN, TIBC, IRON, RETICCTPCT in the last 72 hours.  Coagulation profile  Recent Labs Lab 12/04/15 0001 12/05/15 0356 12/06/15 0408  INR 1.25 1.16 1.19    No results for input(s): DDIMER in the last 72 hours.  Cardiac Enzymes No results for input(s): CKMB, TROPONINI, MYOGLOBIN in the last 168 hours.  Invalid input(s): CK ------------------------------------------------------------------------------------------------------------------ Invalid input(s): POCBNP     Time Spent in minutes   30 minutes   Jasey Cortez M.D on 12/06/2015 at 11:54 AM  Between 7am to 7pm - Pager - [phone]  After 7pm go to [email or web address] - password [facility]  [facility]   Office  [phone]

## 2015-12-06 NOTE — Progress Notes (Signed)
SUBJECTIVE:  No acute complaints this morning.  Breathing is OK.     PHYSICAL EXAM Filed Vitals:   12/06/15 0300 12/06/15 0400 12/06/15 0500 12/06/15 0600  BP: 124/84 137/89 134/68 141/88  Pulse: 50 38 41 76  Temp:   98.9 F (37.2 C)   TempSrc:   Oral   Resp: 17 24 18 20   Height:      Weight:   148 lb 13 oz (67.5 kg)   SpO2: 95% 96% 97% 96%   General:  No distress Lungs:  Clear Heart:  Irregular, 3/6 systolic murmur Abdomen:  Positive bowel sounds, no rebound no guarding Extremities:  No edema  LABS: No results found for: TROPONINI Results for orders placed or performed during the hospital encounter of 12/03/15 (from the past 24 hour(s))  Heparin level (unfractionated)     Status: Abnormal   Collection Time: 12/05/15  2:36 PM  Result Value Ref Range   Heparin Unfractionated 0.26 (L) 0.30 - 0.70 IU/mL  Strep pneumoniae urinary antigen     Status: None   Collection Time: 12/05/15  2:56 PM  Result Value Ref Range   Strep Pneumo Urinary Antigen NEGATIVE NEGATIVE  Heparin level (unfractionated)     Status: Abnormal   Collection Time: 12/05/15 11:05 PM  Result Value Ref Range   Heparin Unfractionated 0.29 (L) 0.30 - 0.70 IU/mL  CBC     Status: Abnormal   Collection Time: 12/06/15  4:08 AM  Result Value Ref Range   WBC 13.3 (H) 4.0 - 10.5 K/uL   RBC 4.01 (L) 4.22 - 5.81 MIL/uL   Hemoglobin 12.6 (L) 13.0 - 17.0 g/dL   HCT 36.0 (L) 39.0 - 52.0 %   MCV 89.8 78.0 - 100.0 fL   MCH 31.4 26.0 - 34.0 pg   MCHC 35.0 30.0 - 36.0 g/dL   RDW 12.2 11.5 - 15.5 %   Platelets 419 (H) 150 - 400 K/uL  Protime-INR     Status: Abnormal   Collection Time: 12/06/15  4:08 AM  Result Value Ref Range   Prothrombin Time 15.3 (H) 11.6 - 15.2 seconds   INR 1.19 0.00 - 99991111  Basic metabolic panel     Status: Abnormal   Collection Time: 12/06/15  4:08 AM  Result Value Ref Range   Sodium 137 135 - 145 mmol/L   Potassium 3.7 3.5 - 5.1 mmol/L   Chloride 105 101 - 111 mmol/L   CO2 23 22 - 32  mmol/L   Glucose, Bld 118 (H) 65 - 99 mg/dL   BUN 30 (H) 6 - 20 mg/dL   Creatinine, Ser 0.70 0.61 - 1.24 mg/dL   Calcium 8.3 (L) 8.9 - 10.3 mg/dL   GFR calc non Af Amer >60 >60 mL/min   GFR calc Af Amer >60 >60 mL/min   Anion gap 9 5 - 15    Intake/Output Summary (Last 24 hours) at 12/06/15 0749 Last data filed at 12/06/15 0600  Gross per 24 hour  Intake 567.21 ml  Output    500 ml  Net  67.21 ml     ASSESSMENT AND PLAN:  MVP/MR:    Plan repair in the future.    ATRIAL FIB:   Continue heparin.  On warfarin per pharmacy.    On PO Dilt yesterday.  Rate is still increased.  He can get 360 Cardizem CD today.  Can add beta blocker later today if his rate is still up and BP allows.   PNEUMONIA:  Per primary team  Minus Breeding 12/06/2015 7:49 AM

## 2015-12-06 NOTE — Progress Notes (Signed)
ANTICOAGULATION CONSULT NOTE - Follow Up Consult  Pharmacy Consult for Heparin Indication: atrial fibrillation  No Known Allergies  Patient Measurements: Height: 6\' 1"  (185.4 cm) Weight: 149 lb 11.1 oz (67.9 kg) IBW/kg (Calculated) : 79.9 Heparin Dosing Weight:   Vital Signs: Temp: 98.6 F (37 C) (03/18 2356) Temp Source: Oral (03/18 2356) BP: 155/105 mmHg (03/19 0200) Pulse Rate: 103 (03/19 0200)  Labs:  Recent Labs  12/03/15 1644 12/04/15 0001 12/04/15 0312  12/05/15 0356 12/05/15 0635 12/05/15 1436 12/05/15 2305  HGB 13.8  --  11.8*  --  12.0*  --   --   --   HCT 40.0  --  35.0*  --  34.5*  --   --   --   PLT 466*  --  298  --  313  --   --   --   APTT  --  29  --   --   --   --   --   --   LABPROT  --  15.8*  --   --  15.0  --   --   --   INR  --  1.25  --   --  1.16  --   --   --   HEPARINUNFRC  --   --   --   < >  --  0.22* 0.26* 0.29*  CREATININE 1.43*  --  0.92  --  0.76  --   --   --   < > = values in this interval not displayed.  Estimated Creatinine Clearance: 87.2 mL/min (by C-G formula based on Cr of 0.76).   Medications:  Infusions:  . heparin 1,550 Units/hr (12/05/15 1941)    Assessment: Patient with low heparin level.  No heparin issues per RN.  Goal of Therapy:  Heparin level 0.3-0.7 units/ml Monitor platelets by anticoagulation protocol: Yes   Plan:  Increase heparin to 1650 units/hr Recheck level at [place] 12/06/2015,2:12 AM

## 2015-12-06 NOTE — Progress Notes (Addendum)
[place] for Heparin-->Coumadin Indication: atrial fibrillation  No Known Allergies  Patient Measurements: Height: 6\' 1"  (185.4 cm) Weight: 148 lb 13 oz (67.5 kg) IBW/kg (Calculated) : 79.9 HEPARIN DW (KG): 65.5   Vital Signs: Temp: 97.9 F (36.6 C) (03/19 0800) Temp Source: Oral (03/19 0800) BP: 130/70 mmHg (03/19 0800) Pulse Rate: 116 (03/19 0800)  Labs:  Recent Labs  12/04/15 0001 12/04/15 0312  12/05/15 0356  12/05/15 1436 12/05/15 2305 12/06/15 0408 12/06/15 0942  HGB  --  11.8*  --  12.0*  --   --   --  12.6*  --   HCT  --  35.0*  --  34.5*  --   --   --  36.0*  --   PLT  --  298  --  313  --   --   --  419*  --   APTT 29  --   --   --   --   --   --   --   --   LABPROT 15.8*  --   --  15.0  --   --   --  15.3*  --   INR 1.25  --   --  1.16  --   --   --  1.19  --   HEPARINUNFRC  --   --   < >  --   < > 0.26* 0.29*  --  0.53  CREATININE  --  0.92  --  0.76  --   --   --  0.70  --   < > = values in this interval not displayed.  Estimated Creatinine Clearance: 86.7 mL/min (by C-G formula based on Cr of 0.7).   Medical History: Past Medical History  Diagnosis Date  . Heart murmur   . Severe mitral regurgitation 07/30/2015  . MVP (mitral valve prolapse) 06/10/2015  . Abnormal EKG 06/10/2015  . PVC (premature ventricular contraction) 06/10/2015    Medications:  Infusions:  . heparin 1,650 Units/hr (12/06/15 0700)    Assessment: Patient with new onset afib.  Not on oral anticoagulant PTA.  Cardiology recommendations for Coumadin noted (MV prolapse).  Baseline INR 1.25.   *Of note, patient refused to start Coumadin 3/17 until he discussed further with MD.  Coumadin started 3/18.  12/06/2015:   Heparin level therapeutic x1.    INR still at baseline after 1 dose of Coumadin  RN does not relay any issues with pump.    CBC stable.  No bleeding reported.   Drug-drug Interactions: Zithromax for PNA- potential  drug-drug interaction with Zithromax could increase INR.     Goal of Therapy:  Heparin level 0.3-0.7 units/ml Monitor platelets by anticoagulation protocol: Yes  INR 2-3   Plan:   Coninue heparin gtt at 1650 units/hr  Recheck 8h heparin level  Daily heparin level, CBC  Coumadin 5mg  po x1 today  Daily INR  Netta Cedars, PharmD, BCPS Pager: [phone] 12/06/2015@11 :01 AM

## 2015-12-06 NOTE — Progress Notes (Signed)
Pharmacy Consult Note - Heparin Follow Up  Labs: heparin level 0.51  A/P: heparin level at goal (0.3-0.7) on current rate of 1650 units/hr. No reported bleeding. Continue current rate. Recheck heparin level in AM  Adrian Saran, PharmD, BCPS Pager [phone] 12/06/2015 7:44 PM

## 2015-12-07 DIAGNOSIS — I119 Hypertensive heart disease without heart failure: Secondary | ICD-10-CM

## 2015-12-07 DIAGNOSIS — I340 Nonrheumatic mitral (valve) insufficiency: Secondary | ICD-10-CM

## 2015-12-07 DIAGNOSIS — I341 Nonrheumatic mitral (valve) prolapse: Secondary | ICD-10-CM

## 2015-12-07 LAB — BASIC METABOLIC PANEL
ANION GAP: 7 (ref 5–15)
BUN: 23 mg/dL — ABNORMAL HIGH (ref 6–20)
CALCIUM: 8.1 mg/dL — AB (ref 8.9–10.3)
CO2: 22 mmol/L (ref 22–32)
Chloride: 106 mmol/L (ref 101–111)
Creatinine, Ser: 0.69 mg/dL (ref 0.61–1.24)
GFR calc Af Amer: >60 mL/min (ref >60)
GLUCOSE: 98 mg/dL (ref 65–99)
Potassium: 3.8 mmol/L (ref 3.5–5.1)
SODIUM: 135 mmol/L (ref 135–145)

## 2015-12-07 LAB — CBC
HEMATOCRIT: 36.6 % — AB (ref 39.0–52.0)
HEMOGLOBIN: 12.3 g/dL — AB (ref 13.0–17.0)
MCH: 31.5 pg (ref 26.0–34.0)
MCHC: 33.6 g/dL (ref 30.0–36.0)
MCV: 93.8 fL (ref 78.0–100.0)
Platelets: 417 10*3/uL — ABNORMAL HIGH (ref 150–400)
RBC: 3.9 MIL/uL — AB (ref 4.22–5.81)
RDW: 12.6 % (ref 11.5–15.5)
WBC: 10.5 10*3/uL (ref 4.0–10.5)

## 2015-12-07 LAB — PROTIME-INR
INR: 1.4 (ref 0.00–1.49)
Prothrombin Time: 17.3 seconds — ABNORMAL HIGH (ref 11.6–15.2)

## 2015-12-07 LAB — MAGNESIUM: MAGNESIUM: 1.7 mg/dL (ref 1.7–2.4)

## 2015-12-07 LAB — HEPARIN LEVEL (UNFRACTIONATED): HEPARIN UNFRACTIONATED: 0.54 [IU]/mL (ref 0.30–0.70)

## 2015-12-07 LAB — PROCALCITONIN

## 2015-12-07 MED ORDER — POTASSIUM CHLORIDE CRYS ER 20 MEQ PO TBCR
40.0000 meq | EXTENDED_RELEASE_TABLET | Freq: Once | ORAL | Status: AC
  Administered 2015-12-07: 40 meq via ORAL
  Filled 2015-12-07: qty 2

## 2015-12-07 MED ORDER — MAGNESIUM SULFATE 2 GM/50ML IV SOLN
2.0000 g | Freq: Once | INTRAVENOUS | Status: AC
  Administered 2015-12-07: 2 g via INTRAVENOUS
  Filled 2015-12-07: qty 50

## 2015-12-07 MED ORDER — WARFARIN SODIUM 5 MG PO TABS
5.0000 mg | ORAL_TABLET | Freq: Once | ORAL | Status: AC
  Administered 2015-12-07: 5 mg via ORAL
  Filled 2015-12-07: qty 1

## 2015-12-07 MED ORDER — DIGOXIN 125 MCG PO TABS
0.1250 mg | ORAL_TABLET | Freq: Every day | ORAL | Status: DC
  Administered 2015-12-08 – 2015-12-10 (×3): 0.125 mg via ORAL
  Filled 2015-12-07 (×3): qty 1

## 2015-12-07 MED ORDER — METOPROLOL TARTRATE 25 MG PO TABS
50.0000 mg | ORAL_TABLET | Freq: Two times a day (BID) | ORAL | Status: DC
  Administered 2015-12-07: 50 mg via ORAL
  Filled 2015-12-07: qty 2

## 2015-12-07 MED ORDER — DIGOXIN 0.25 MG/ML IJ SOLN
0.2500 mg | Freq: Once | INTRAMUSCULAR | Status: AC
  Administered 2015-12-07: 0.25 mg via INTRAVENOUS
  Filled 2015-12-07: qty 1

## 2015-12-07 MED ORDER — METOPROLOL TARTRATE 50 MG PO TABS
100.0000 mg | ORAL_TABLET | Freq: Two times a day (BID) | ORAL | Status: DC
  Administered 2015-12-07 – 2015-12-10 (×6): 100 mg via ORAL
  Filled 2015-12-07 (×7): qty 2

## 2015-12-07 MED ORDER — ENSURE ENLIVE PO LIQD
237.0000 mL | Freq: Two times a day (BID) | ORAL | Status: DC
  Administered 2015-12-07 – 2015-12-08 (×2): 237 mL via ORAL

## 2015-12-07 NOTE — Progress Notes (Signed)
Agree with previous nursing assessment

## 2015-12-07 NOTE — Progress Notes (Signed)
[facility] for Heparin-->Coumadin Indication: atrial fibrillation  No Known Allergies  Patient Measurements: Height: 6\' 1"  (185.4 cm) Weight: 148 lb 13 oz (67.5 kg) IBW/kg (Calculated) : 79.9 HEPARIN DW (KG): 65.5   Vital Signs: Temp: 97.9 F (36.6 C) (03/20 0346) Temp Source: Oral (03/20 0346) BP: 126/87 mmHg (03/20 0500) Pulse Rate: 100 (03/20 0500)  Labs:  Recent Labs  12/05/15 0356  12/06/15 0408 12/06/15 0942 12/06/15 1848 12/07/15 0508 12/07/15 0509  HGB 12.0*  --  12.6*  --   --  12.3*  --   HCT 34.5*  --  36.0*  --   --  36.6*  --   PLT 313  --  419*  --   --  417*  --   LABPROT 15.0  --  15.3*  --   --  17.3*  --   INR 1.16  --  1.19  --   --  1.40  --   HEPARINUNFRC  --   < >  --  0.53 0.51  --  0.54  CREATININE 0.76  --  0.70  --   --  0.69  --   < > = values in this interval not displayed.  Estimated Creatinine Clearance: 86.7 mL/min (by C-G formula based on Cr of 0.69).   Medical History: Past Medical History  Diagnosis Date  . Heart murmur   . Severe mitral regurgitation 07/30/2015  . MVP (mitral valve prolapse) 06/10/2015  . Abnormal EKG 06/10/2015  . PVC (premature ventricular contraction) 06/10/2015    Medications:  Infusions:  . sodium chloride 75 mL (12/07/15 0257)  . heparin 1,650 Units/hr (12/07/15 0257)    Assessment: Patient with new onset afib.  Not on oral anticoagulant PTA.  Cardiology recommendations for Coumadin noted (MV prolapse).  Baseline INR 1.25.   *Of note, patient refused to start Coumadin 3/17 until he discussed further with MD.  Coumadin started 3/18.  12/07/2015:   Heparin level therapeutic   INR increasing nicely after 2 doses of Coumadin   CBC stable.  No bleeding reported.   Drug-drug Interactions: Zithromax for PNA- potential drug-drug interaction with Zithromax could increase INR.     Goal of Therapy:  Heparin level 0.3-0.7 units/ml Monitor platelets by  anticoagulation protocol: Yes  INR 2-3   Plan:   Continue heparin gtt at 1650 units/hr  Daily heparin level, CBC  Coumadin 5mg  po x1 today  Daily INR  Dolly Rias RPh 12/07/2015, 8:14 AM Pager [phone]

## 2015-12-07 NOTE — Evaluation (Signed)
Physical Therapy Evaluation Patient Details Name: Brian Martinez MRN: [id number] DOB: Sep 15, 1951 Today's Date: 12/07/2015   History of Present Illness  Brian Martinez  a 65 y.o. male  adm through  ED with persistent palpitations and dyspnea with minimal exertion.   PMH of hypertension and mitral valve prolapse   Clinical Impression  Patient evaluated by Physical Therapy with no further acute PT needs identified. All education has been completed and the patient has no further questions.  See below for any follow-up Physical Therapy or equipment needs. PT is signing off. Thank you for this referral. Pt mobilizing/amb well with HR controlled at time of PTeval, no DME, no f/u recommended at this time     Follow Up Recommendations No PT follow up    Equipment Recommendations  None recommended by PT    Recommendations for Other Services       Precautions / Restrictions Precautions Precaution Comments: monitor HR Restrictions Weight Bearing Restrictions: No      Mobility  Bed Mobility Overal bed mobility: Needs Assistance Bed Mobility: Supine to Sit     Supine to sit: Supervision     General bed mobility comments: for safety and lines  Transfers Overall transfer level: Needs assistance Equipment used: None Transfers: Sit to/from Stand Sit to Stand: Supervision         General transfer comment: for sfaety and lines; pt took extra time on PT advice so we may monitor his HR  Ambulation/Gait Ambulation/Gait assistance: Supervision Ambulation Distance (Feet): 120 Feet Assistive device: None Gait Pattern/deviations: WFL(Within Functional Limits)     General Gait Details: HR 98 at rest, up to max of 118 during amb, returned to 90s at rest  Stairs            Wheelchair Mobility    Modified Rankin (Stroke Patients Only)       Balance Overall balance assessment: No apparent balance deficits (not formally assessed)   Sitting balance-Leahy Scale:  Normal Sitting balance - Comments: pt able to wt shift, reach bil feet adjust socks, etc    Standing balance support: No upper extremity supported;During functional activity Standing balance-Leahy Scale: Normal Standing balance comment: no LOB with amb, turns, head turns                             Pertinent Vitals/Pain Pain Assessment: No/denies pain    Home Living Family/patient expects to be discharged to:: Private residence Living Arrangements: Alone   Type of Home: House (townhouse) Home Access: Stairs to enter   Technical brewer of Steps: 4 Home Layout: One level Home Equipment: Environmental consultant - 4 wheels;Bedside commode      Prior Function Level of Independence: Independent               Hand Dominance        Extremity/Trunk Assessment   Upper Extremity Assessment: Overall WFL for tasks assessed           Lower Extremity Assessment: Overall WFL for tasks assessed         Communication   Communication: No difficulties  Cognition Arousal/Alertness: Awake/alert Behavior During Therapy: WFL for tasks assessed/performed Overall Cognitive Status: Within Functional Limits for tasks assessed                      General Comments      Exercises        Assessment/Plan    PT  Assessment Patent does not need any further PT services  PT Diagnosis Difficulty walking   PT Problem List    PT Treatment Interventions     PT Goals (Current goals can be found in the Care Plan section) Acute Rehab PT Goals PT Goal Formulation: All assessment and education complete, DC therapy    Frequency     Barriers to discharge        Co-evaluation               End of Session Equipment Utilized During Treatment: Gait belt Activity Tolerance: Patient tolerated treatment well Patient left: with call bell/phone within reach;in chair;with nursing/sitter in room           Time: [id number] PT Time Calculation (min) (ACUTE ONLY): 20  min   Charges:   PT Evaluation $PT Eval Moderate Complexity: 1 Procedure     PT G Codes:        Merle Whitehorn 04-Jan-2016, 11:38 AM

## 2015-12-07 NOTE — Progress Notes (Signed)
Patient Demographics  Brian Martinez, is a 65 y.o. male, DOB - [birth year], [id number]  Admit date - 12/03/2015   Admitting Physician Vianne Bulls, MD  Outpatient Primary MD for the patient is Brian Spark, MD  LOS - 4   Chief Complaint  Patient presents with  . URI  . Near Syncope       Admission HPI/Brief narrative: 65 yo with history of hypertension, severe mitral regurgitation secondary to prolapse, PVCs, Presents with palpitation, dyspnea and presyncope, in ED workup significant for CAP, and new onset A. fib with RVR.   Subjective:   Brian Martinez today has, No headache, No chest pain, No abdominal pain - Reports dyspnea is improving, Cough is improving, currently dry.  Assessment & Plan    Principal Problem:   Atrial fibrillation with RVR (HCC) Active Problems:   MVP (mitral valve prolapse)   Severe mitral regurgitation   Hyperlipidemia   Hypertensive heart disease   AKI (acute kidney injury) ([place])   CAP (community acquired pneumonia)   Thrombocytosis ([place])   Essential hypertension  Atrial fibrillation with RVR  - CHADS-VASc score is 2 for age >64 and HTN  - TTE (07/30/2015) with severe LA dilation, suggesting this it was  chronic  - Initially on Cardizem drip, currently transitioned to by mouth Cardizem, management per cardiology, heart rate remains elevated, Started on metoprolol yesterday, dose was increased today by cardiology, added digoxin as well. - Monitor on telemetry  - Currently on heparin GTT, started on warfarin, pharmacy to dose warfarin, will need to be on warfarin given his MV prolapse, and Severe MR. - Monitor potassium and magnesium and replace  CAP  - RLL infiltrate noted on CXR - Leukocytosis of 21k on admission , normalized - Continue with Rocephin and azithromycin . - Follow-up imaging recommended to exclude underlying mass  . AKI  -  SCr 1.43 on admission, up from 0.74 at last check 6 months  prior  - Improving with IV fluids   Hypertension  - Previously on Losartan, but pt stopped taking  - Cardizem, Started on Metoprolol As Well  Thrombocytosis  - Platelet count 466,000 on admission  - Likely representing an acute phase reactant in setting of PNA and new-onset a fib RVR  - Resolved  MVP with flail leaflet and severe MR  - Demonstrated on TTE (07/30/15)  - Has consulted with Dr. Roxy Manns of [place] and is planning tentatively for operative repair   Hypokalemia - Repleted, monitor closely  Code Status: Full  Family Communication: None at bedside  Disposition Plan: We'll transfer to telemetry.   Procedures  None   Consults   Cardiology   Medications  Scheduled Meds: . antiseptic oral rinse  7 mL Mouth Rinse BID  . cefTRIAXone (ROCEPHIN)  IV  1 g Intravenous Q24H  . cholecalciferol  5,000 Units Oral Daily  . [START ON 12/08/2015] digoxin  0.125 mg Oral Daily  . diltiazem  360 mg Oral Daily  . feeding supplement (ENSURE ENLIVE)  237 mL Oral BID BM  . guaiFENesin  1,200 mg Oral BID  . metoprolol tartrate  100 mg Oral BID  . vitamin B-12  100 mcg Oral Daily  . warfarin  5 mg Oral ONCE-1800  .  Warfarin - Pharmacist Dosing Inpatient   Does not apply q1800   Continuous Infusions: . sodium chloride 75 mL/hr at 12/07/15 0842  . heparin 1,650 Units/hr (12/07/15 0257)   PRN Meds:.acetaminophen, dextromethorphan, diltiazem, ondansetron (ZOFRAN) IV  DVT Prophylaxis  Heparin GTT  Lab Results  Component Value Date   PLT 417* 12/07/2015    Antibiotics    Anti-infectives    Start     Dose/Rate Route Frequency Ordered Stop   12/04/15 2000  cefTRIAXone (ROCEPHIN) 1 g in dextrose 5 % 50 mL IVPB     1 g 100 mL/hr over 30 Minutes Intravenous Every 24 hours 12/03/15 2353 12/11/15 1959   12/04/15 1000  azithromycin (ZITHROMAX) tablet 250 mg     250 mg Oral Daily 12/03/15 2128 12/07/15 0902   12/03/15  2130  cefTRIAXone (ROCEPHIN) 1 g in dextrose 5 % 50 mL IVPB     1 g 100 mL/hr over 30 Minutes Intravenous  Once 12/03/15 2128 12/03/15 2229   12/03/15 2130  azithromycin (ZITHROMAX) tablet 500 mg     500 mg Oral Daily 12/03/15 2128 12/03/15 2152          Objective:   Filed Vitals:   12/07/15 0600 12/07/15 0700 12/07/15 0800 12/07/15 0900  BP: 145/118 141/90 153/96 154/80  Pulse: 58 102 105   Temp:   98 F (36.7 C)   TempSrc:   Oral   Resp: 11 10 32   Height:      Weight:      SpO2: 97% 96% 97%     Wt Readings from Last 3 Encounters:  12/06/15 67.5 kg (148 lb 13 oz)  09/04/15 71.215 kg (157 lb)  08/11/15 71.668 kg (158 lb)     Intake/Output Summary (Last 24 hours) at 12/07/15 1206 Last data filed at 12/07/15 0903  Gross per 24 hour  Intake 2331.75 ml  Output      0 ml  Net 2331.75 ml     Physical Exam  Awake Alert, Oriented X 3, No new F.N deficits, Normal affect [place].AT,PERRAL Supple Neck,No JVD, No cervical lymphadenopathy appriciated.  Symmetrical Chest wall movement, Good air movement bilaterally, No wheezing Irregular,No Gallops,Rubs, murmur +, No Parasternal Heave +ve B.Sounds, Abd Soft, No tenderness, No organomegaly appriciated, No rebound - guarding or rigidity. No Cyanosis, Clubbing or edema, No new Rash or bruise     Data Review   Micro Results Recent Results (from the past 240 hour(s))  MRSA PCR Screening     Status: None   Collection Time: 12/04/15  1:49 AM  Result Value Ref Range Status   MRSA by PCR NEGATIVE NEGATIVE Final    Comment:        The GeneXpert MRSA Assay (FDA approved for NASAL specimens only), is one component of a comprehensive MRSA colonization surveillance program. It is not intended to diagnose MRSA infection nor to guide or monitor treatment for MRSA infections.   Culture, expectorated sputum-assessment     Status: None   Collection Time: 12/04/15 10:45 AM  Result Value Ref Range Status   Specimen Description  SPUTUM  Final   Special Requests NONE  Final   Sputum evaluation   Final    THIS SPECIMEN IS ACCEPTABLE. RESPIRATORY CULTURE REPORT TO FOLLOW.   Report Status 12/04/2015 FINAL  Final  Culture, respiratory (NON-Expectorated)     Status: None (Preliminary result)   Collection Time: 12/04/15 10:45 AM  Result Value Ref Range Status   Specimen Description SPUTUM  Final  Special Requests NONE  Final   Gram Stain   Final    ABUNDANT WBC PRESENT, PREDOMINANTLY PMN RARE SQUAMOUS EPITHELIAL CELLS PRESENT ABUNDANT GRAM POSITIVE COCCI IN PAIRS FEW GRAM POSITIVE RODS RARE YEAST WITH PSEUDOHYPHAE Performed at [facility]    Culture   Final    Culture reincubated for better growth Performed at [facility]    Report Status PENDING  Incomplete    Radiology Reports Dg Chest Port 1 View  12/03/2015  CLINICAL DATA:  Nasal and chest congestion EXAM: PORTABLE CHEST 1 VIEW COMPARISON:  None. FINDINGS: There is mild opacity in the right base compared to the left. No other pulmonary opacities. No pneumothorax. No pulmonary nodules or masses. Mild cardiomegaly. The hila and mediastinum are normal. IMPRESSION: Right lower lobe infiltrate.  Recommend follow-up to resolution. Electronically Signed   By: Dorise Bullion III M.D   On: 12/03/2015 16:39     CBC  Recent Labs Lab 12/03/15 1644 12/04/15 0312 12/05/15 0356 12/06/15 0408 12/07/15 0508  WBC 21.1* 14.0* 15.2* 13.3* 10.5  HGB 13.8 11.8* 12.0* 12.6* 12.3*  HCT 40.0 35.0* 34.5* 36.0* 36.6*  PLT 466* 298 313 419* 417*  MCV 92.8 90.2 89.4 89.8 93.8  MCH 32.0 30.4 31.1 31.4 31.5  MCHC 34.5 33.7 34.8 35.0 33.6  RDW 12.4 12.5 12.4 12.2 12.6  LYMPHSABS  --  1.9  --   --   --   MONOABS  --  1.0  --   --   --   EOSABS  --  0.0  --   --   --   BASOSABS  --  0.0  --   --   --     Chemistries   Recent Labs Lab 12/03/15 1644 12/04/15 0001 12/04/15 0312 12/05/15 0356 12/06/15 0408 12/07/15 0508  NA 139  --  137 134* 137 135   K 3.7  --  3.6 3.2* 3.7 3.8  CL 99*  --  102 103 105 106  CO2 26  --  25 23 23 22   GLUCOSE 141*  --  113* 114* 118* 98  BUN 70*  --  56* 38* 30* 23*  CREATININE 1.43*  --  0.92 0.76 0.70 0.69  CALCIUM 9.1  --  8.4* 8.3* 8.3* 8.1*  MG  --  2.2  --  1.8  --  1.7   ------------------------------------------------------------------------------------------------------------------ estimated creatinine clearance is 86.7 mL/min (by C-G formula based on Cr of 0.69). ------------------------------------------------------------------------------------------------------------------ No results for input(s): HGBA1C in the last 72 hours. ------------------------------------------------------------------------------------------------------------------ No results for input(s): CHOL, HDL, LDLCALC, TRIG, CHOLHDL, LDLDIRECT in the last 72 hours. ------------------------------------------------------------------------------------------------------------------ No results for input(s): TSH, T4TOTAL, T3FREE, THYROIDAB in the last 72 hours.  Invalid input(s): FREET3 ------------------------------------------------------------------------------------------------------------------ No results for input(s): VITAMINB12, FOLATE, FERRITIN, TIBC, IRON, RETICCTPCT in the last 72 hours.  Coagulation profile  Recent Labs Lab 12/04/15 0001 12/05/15 0356 12/06/15 0408 12/07/15 0508  INR 1.25 1.16 1.19 1.40    No results for input(s): DDIMER in the last 72 hours.  Cardiac Enzymes No results for input(s): CKMB, TROPONINI, MYOGLOBIN in the last 168 hours.  Invalid input(s): CK ------------------------------------------------------------------------------------------------------------------ Invalid input(s): POCBNP     Time Spent in minutes   30 minutes   ELGERGAWY, DAWOOD M.D on 12/07/2015 at 12:06 PM  Between 7am to 7pm - Pager - [phone]  After 7pm go to [email or web address] - password [facility]  [facility]   Office  [phone]  Patient Demographics  Benajamin Copland, is a 65 y.o. male, DOB - Feb 11, 1951, [id number]  Admit date - 12/03/2015   Admitting Physician Vianne Bulls, MD  Outpatient Primary MD for the patient is Brian Spark, MD  LOS - 4   Chief Complaint  Patient presents with  . URI  . Near Syncope       Admission HPI/Brief narrative: 65 yo with history of hypertension, severe mitral regurgitation secondary to prolapse, PVCs, Presents with palpitation, dyspnea and presyncope, in ED workup significant for CAP, and new onset A. fib with RVR.   Subjective:   Brian Martinez today has, No headache, No chest pain, No abdominal pain - Reports dyspnea is improving, Cough is improving, currently dry.  Assessment & Plan    Principal Problem:   Atrial fibrillation with RVR (HCC) Active Problems:   MVP (mitral valve prolapse)   Severe mitral regurgitation   Hyperlipidemia   Hypertensive heart disease   AKI (acute kidney injury) ([place])   CAP (community acquired pneumonia)   Thrombocytosis ([place])   Essential hypertension  Atrial fibrillation with RVR  - CHADS-VASc score is 2 for age >57 and HTN  - TTE (07/30/2015) with severe LA dilation, suggesting this it was  chronic  - Initially on Cardizem drip, currently transitioned to by mouth Cardizem, management per cardiology, heart rate remains elevated, will start on beta blocker. - Monitor on telemetry  - Currently on heparin GTT, started on warfarin, pharmacy to dose warfarin, will need to be on warfarin given his MV prolapse.  CAP  - RLL infiltrate noted on CXR - Leukocytosis of 21k on admission , ending down - Continue with Rocephin and azithromycin . - Follow-up imaging recommended to exclude underlying mass  . AKI  - SCr 1.43 on admission, up from 0.74 at last check 6 months  prior  - Improving  with IV fluids   Hypertension  - Previously on Losartan, but pt stopped taking  - Cardizem, Started on Metoprolol As Well  Thrombocytosis  - Platelet count 466,000 on admission  - Likely representing an acute phase reactant in setting of PNA and new-onset a fib RVR  - Resolved  MVP with flail leaflet and severe MR  - Demonstrated on TTE (07/30/15)  - Has consulted with Dr. Roxy Manns of [place] and is planning tentatively for operative repair   Hypokalemia - Related, monitor closely  Code Status: Full  Family Communication: None at bedside  Disposition Plan: Initial transfer order to telemetry as canceled given heart rate remains significantly elevated.   Procedures  None   Consults   Cardiology   Medications  Scheduled Meds: . antiseptic oral rinse  7 mL Mouth Rinse BID  . cefTRIAXone (ROCEPHIN)  IV  1 g Intravenous Q24H  . cholecalciferol  5,000 Units Oral Daily  . [START ON 12/08/2015] digoxin  0.125 mg Oral Daily  . diltiazem  360 mg Oral Daily  . feeding supplement (ENSURE ENLIVE)  237 mL Oral BID BM  . guaiFENesin  1,200 mg Oral BID  . metoprolol tartrate  100 mg Oral BID  . vitamin B-12  100 mcg Oral Daily  . warfarin  5 mg Oral ONCE-1800  . Warfarin - Pharmacist Dosing Inpatient   Does not apply q1800   Continuous Infusions: . sodium chloride 75 mL/hr at 12/07/15 0842  . heparin 1,650 Units/hr (12/07/15 0257)   PRN Meds:.acetaminophen, dextromethorphan, diltiazem, ondansetron (ZOFRAN) IV  DVT Prophylaxis  Heparin GTT  Lab Results  Component Value  Date   PLT 417* 12/07/2015    Antibiotics    Anti-infectives    Start     Dose/Rate Route Frequency Ordered Stop   12/04/15 2000  cefTRIAXone (ROCEPHIN) 1 g in dextrose 5 % 50 mL IVPB     1 g 100 mL/hr over 30 Minutes Intravenous Every 24 hours 12/03/15 2353 12/11/15 1959   12/04/15 1000  azithromycin (ZITHROMAX) tablet 250 mg     250 mg Oral Daily 12/03/15 2128 12/07/15 0902   12/03/15 2130   cefTRIAXone (ROCEPHIN) 1 g in dextrose 5 % 50 mL IVPB     1 g 100 mL/hr over 30 Minutes Intravenous  Once 12/03/15 2128 12/03/15 2229   12/03/15 2130  azithromycin (ZITHROMAX) tablet 500 mg     500 mg Oral Daily 12/03/15 2128 12/03/15 2152          Objective:   Filed Vitals:   12/07/15 0600 12/07/15 0700 12/07/15 0800 12/07/15 0900  BP: 145/118 141/90 153/96 154/80  Pulse: 58 102 105   Temp:   98 F (36.7 C)   TempSrc:   Oral   Resp: 11 10 32   Height:      Weight:      SpO2: 97% 96% 97%     Wt Readings from Last 3 Encounters:  12/06/15 67.5 kg (148 lb 13 oz)  09/04/15 71.215 kg (157 lb)  08/11/15 71.668 kg (158 lb)     Intake/Output Summary (Last 24 hours) at 12/07/15 1207 Last data filed at 12/07/15 [id number]  Gross per 24 hour  Intake 2331.75 ml  Output      0 ml  Net 2331.75 ml     Physical Exam  Awake Alert, Oriented X 3, No new F.N deficits, Normal affect .AT,PERRAL Supple Neck,No JVD, No cervical lymphadenopathy appriciated.  Symmetrical Chest wall movement, Good air movement bilaterally, No wheezing Irregular,No Gallops,Rubs, murmur +, No Parasternal Heave +ve B.Sounds, Abd Soft, No tenderness, No organomegaly appriciated, No rebound - guarding or rigidity. No Cyanosis, Clubbing or edema, No new Rash or bruise     Data Review   Micro Results Recent Results (from the past 240 hour(s))  MRSA PCR Screening     Status: None   Collection Time: 12/04/15  1:49 AM  Result Value Ref Range Status   MRSA by PCR NEGATIVE NEGATIVE Final    Comment:        The GeneXpert MRSA Assay (FDA approved for NASAL specimens only), is one component of a comprehensive MRSA colonization surveillance program. It is not intended to diagnose MRSA infection nor to guide or monitor treatment for MRSA infections.   Culture, expectorated sputum-assessment     Status: None   Collection Time: 12/04/15 10:45 AM  Result Value Ref Range Status   Specimen Description SPUTUM   Final   Special Requests NONE  Final   Sputum evaluation   Final    THIS SPECIMEN IS ACCEPTABLE. RESPIRATORY CULTURE REPORT TO FOLLOW.   Report Status 12/04/2015 FINAL  Final  Culture, respiratory (NON-Expectorated)     Status: None (Preliminary result)   Collection Time: 12/04/15 10:45 AM  Result Value Ref Range Status   Specimen Description SPUTUM  Final   Special Requests NONE  Final   Gram Stain   Final    ABUNDANT WBC PRESENT, PREDOMINANTLY PMN RARE SQUAMOUS EPITHELIAL CELLS PRESENT ABUNDANT GRAM POSITIVE COCCI IN PAIRS FEW GRAM POSITIVE RODS RARE YEAST WITH PSEUDOHYPHAE Performed at [facility]  Final    Culture reincubated for better growth Performed at [place] PENDING  Incomplete    Radiology Reports Dg Chest Port 1 View  12/03/2015  CLINICAL DATA:  Nasal and chest congestion EXAM: PORTABLE CHEST 1 VIEW COMPARISON:  None. FINDINGS: There is mild opacity in the right base compared to the left. No other pulmonary opacities. No pneumothorax. No pulmonary nodules or masses. Mild cardiomegaly. The hila and mediastinum are normal. IMPRESSION: Right lower lobe infiltrate.  Recommend follow-up to resolution. Electronically Signed   By: Dorise Bullion III M.D   On: 12/03/2015 16:39     CBC  Recent Labs Lab 12/03/15 1644 12/04/15 0312 12/05/15 0356 12/06/15 0408 12/07/15 0508  WBC 21.1* 14.0* 15.2* 13.3* 10.5  HGB 13.8 11.8* 12.0* 12.6* 12.3*  HCT 40.0 35.0* 34.5* 36.0* 36.6*  PLT 466* 298 313 419* 417*  MCV 92.8 90.2 89.4 89.8 93.8  MCH 32.0 30.4 31.1 31.4 31.5  MCHC 34.5 33.7 34.8 35.0 33.6  RDW 12.4 12.5 12.4 12.2 12.6  LYMPHSABS  --  1.9  --   --   --   MONOABS  --  1.0  --   --   --   EOSABS  --  0.0  --   --   --   BASOSABS  --  0.0  --   --   --     Chemistries   Recent Labs Lab 12/03/15 1644 12/04/15 0001 12/04/15 0312 12/05/15 0356 12/06/15 0408 12/07/15 0508  NA 139  --  137 134* 137 135  K 3.7   --  3.6 3.2* 3.7 3.8  CL 99*  --  102 103 105 106  CO2 26  --  25 23 23 22   GLUCOSE 141*  --  113* 114* 118* 98  BUN 70*  --  56* 38* 30* 23*  CREATININE 1.43*  --  0.92 0.76 0.70 0.69  CALCIUM 9.1  --  8.4* 8.3* 8.3* 8.1*  MG  --  2.2  --  1.8  --  1.7   ------------------------------------------------------------------------------------------------------------------ estimated creatinine clearance is 86.7 mL/min (by C-G formula based on Cr of 0.69). ------------------------------------------------------------------------------------------------------------------ No results for input(s): HGBA1C in the last 72 hours. ------------------------------------------------------------------------------------------------------------------ No results for input(s): CHOL, HDL, LDLCALC, TRIG, CHOLHDL, LDLDIRECT in the last 72 hours. ------------------------------------------------------------------------------------------------------------------ No results for input(s): TSH, T4TOTAL, T3FREE, THYROIDAB in the last 72 hours.  Invalid input(s): FREET3 ------------------------------------------------------------------------------------------------------------------ No results for input(s): VITAMINB12, FOLATE, FERRITIN, TIBC, IRON, RETICCTPCT in the last 72 hours.  Coagulation profile  Recent Labs Lab 12/04/15 0001 12/05/15 0356 12/06/15 0408 12/07/15 0508  INR 1.25 1.16 1.19 1.40    No results for input(s): DDIMER in the last 72 hours.  Cardiac Enzymes No results for input(s): CKMB, TROPONINI, MYOGLOBIN in the last 168 hours.  Invalid input(s): CK ------------------------------------------------------------------------------------------------------------------ Invalid input(s): POCBNP     Time Spent in minutes   30 minutes   ELGERGAWY, DAWOOD M.D on 12/07/2015 at 12:07 PM  Between 7am to 7pm - Pager - [phone]  After 7pm go to [email or web address] - password [facility]  [facility]    Office  [phone]

## 2015-12-07 NOTE — Progress Notes (Signed)
Patient Name: Brian Martinez Date of Encounter: 12/07/2015  Primary Cardiologist: Dr. Meda Coffee   Principal Problem:   Atrial fibrillation with RVR ([place]) Active Problems:   MVP (mitral valve prolapse)   Severe mitral regurgitation   Hyperlipidemia   Hypertensive heart disease   AKI (acute kidney injury) ([place])   CAP (community acquired pneumonia)   Thrombocytosis ([place])   Essential hypertension    SUBJECTIVE  Denies any CP. No cardiac awareness of fluttering sensation. Says the only thing he feel is slight weakness  CURRENT MEDS . antiseptic oral rinse  7 mL Mouth Rinse BID  . aspirin EC  81 mg Oral Daily  . cefTRIAXone (ROCEPHIN)  IV  1 g Intravenous Q24H  . cholecalciferol  5,000 Units Oral Daily  . diltiazem  360 mg Oral Daily  . feeding supplement (ENSURE ENLIVE)  237 mL Oral BID BM  . guaiFENesin  1,200 mg Oral BID  . magnesium sulfate 1 - 4 g bolus IVPB  2 g Intravenous Once  . metoprolol tartrate  50 mg Oral BID  . vitamin B-12  100 mcg Oral Daily  . warfarin  5 mg Oral ONCE-1800  . Warfarin - Pharmacist Dosing Inpatient   Does not apply q1800    OBJECTIVE  Filed Vitals:   12/07/15 0300 12/07/15 0346 12/07/15 0400 12/07/15 0500  BP: 131/83  127/92 126/87  Pulse: 102  53 100  Temp:  97.9 F (36.6 C)    TempSrc:  Oral    Resp: 16  23 15   Height:      Weight:      SpO2: 95%  93% 93%    Intake/Output Summary (Last 24 hours) at 12/07/15 0907 Last data filed at 12/07/15 0500  Gross per 24 hour  Intake 1791.25 ml  Output      0 ml  Net 1791.25 ml   Filed Weights   12/04/15 0230 12/05/15 0400 12/06/15 0500  Weight: 144 lb 6.4 oz (65.5 kg) 149 lb 11.1 oz (67.9 kg) 148 lb 13 oz (67.5 kg)    PHYSICAL EXAM  General: Pleasant, NAD. Neuro: Alert and oriented X 3. Moves all extremities spontaneously. Psych: Normal affect. HEENT:  Normal  Neck: Supple without bruits or JVD. Lungs:  Resp regular and unlabored, CTA. Heart: irregular, tachycardic. no s3, s4,  or murmurs. Abdomen: Soft, non-tender, non-distended, BS + x 4.  Extremities: No clubbing, cyanosis or edema. DP/PT/Radials 2+ and equal bilaterally.  Accessory Clinical Findings  CBC  Recent Labs  12/06/15 0408 12/07/15 0508  WBC 13.3* 10.5  HGB 12.6* 12.3*  HCT 36.0* 36.6*  MCV 89.8 93.8  PLT 419* [id number]*   Basic Metabolic Panel  Recent Labs  12/05/15 0356 12/06/15 0408 12/07/15 0508  NA 134* 137 135  K 3.2* 3.7 3.8  CL 103 105 106  CO2 23 23 22   GLUCOSE 114* 118* 98  BUN 38* 30* 23*  CREATININE 0.76 0.70 0.69  CALCIUM 8.3* 8.3* 8.1*  MG 1.8  --  1.7    TELE afib with HR 110-30s    ECG  No new EKG  Echocardiogram 07/30/2015  LV EF: 60% - 65%  ------------------------------------------------------------------- Indications: Abnormal EKG (R94.31).  ------------------------------------------------------------------- Study Conclusions  - Left ventricle: There was moderate concentric hypertrophy.  Systolic function was normal. The estimated ejection fraction was  in the range of 60% to 65%. The study is not technically  sufficient to allow evaluation of LV diastolic function. - Aortic valve: There was trivial regurgitation. -  Mitral valve: Moderate prolapse and partially flail leaflet  involving the posterior leaflet. There was severe regurgitation  directed anteriorly. - Left atrium: The atrium was moderately to severely dilated.    Radiology/Studies  Dg Chest Port 1 View  12/03/2015  CLINICAL DATA:  Nasal and chest congestion EXAM: PORTABLE CHEST 1 VIEW COMPARISON:  None. FINDINGS: There is mild opacity in the right base compared to the left. No other pulmonary opacities. No pneumothorax. No pulmonary nodules or masses. Mild cardiomegaly. The hila and mediastinum are normal. IMPRESSION: Right lower lobe infiltrate.  Recommend follow-up to resolution. Electronically Signed   By: Dorise Bullion III M.D   On: 12/03/2015 16:39     ASSESSMENT AND PLAN  65 yo male with PMH of HTN, severe MR, PVCs presented with PNA and found to have new afib with RVR  1. Newly diagnosed atrial fibrillation with RVR  - CHA2DS2-Vasc score 2 (age, HTN), given severe MR, need coumadin  - success of cardioversion low given moderately to severely dilated LA on echo 07/30/2015, EF normal  - continue 360mg  Diltiazem CD, metoprolol tartrate increased to 50mg  BID this morning. Stop ASA, continue coumadin.    - SBP 150, HR 110-130s, will increase metoprolol to 100mg  BID this afternoon. Add 0.25mg  IV digoxin, starter 0.125mg  daily PO digoxin tomorrow.   2. RLL PNA: per IM  3. Severe MR: followed by Dr. Roxy Manns, last seen in 07/2015, who recommended surgery, but he wish to think about it. Per Dr. Roxy Manns, if he decided surgery, he will need diagnostic cardiac cath. May need MAZE and/or LAA clip if decided to go further with surgery.   4. HTN  Signed, Woodward Ku Pager: [phone]   I have examined the patient and reviewed assessment and plan and discussed with patient.  Agree with above as stated.  HR better controlled this AM.  Irregular.  He does not appear SHOB.  We spoke about long term need for oral anticoagulation.  His AFib is also an indication that his Mitral valve should be repaired as well.  He would have to recover from his pneumonia, then have a diagnostic cath and then coordinate with Dr. Guy Sandifer office.  Continue rate control adjustments as noted above.  No bradycardia noted.  Coumadin is a better choice given valvular issues and the fact that he would typically be on coumadin at least short term after mitral valve surgery.  Manon Banbury S.

## 2015-12-08 DIAGNOSIS — J189 Pneumonia, unspecified organism: Secondary | ICD-10-CM

## 2015-12-08 LAB — BASIC METABOLIC PANEL
Anion gap: 8 (ref 5–15)
BUN: 18 mg/dL (ref 6–20)
CALCIUM: 8.1 mg/dL — AB (ref 8.9–10.3)
CHLORIDE: 107 mmol/L (ref 101–111)
CO2: 23 mmol/L (ref 22–32)
CREATININE: 0.7 mg/dL (ref 0.61–1.24)
GFR calc Af Amer: >60 mL/min (ref >60)
GFR calc non Af Amer: >60 mL/min (ref >60)
Glucose, Bld: 101 mg/dL — ABNORMAL HIGH (ref 65–99)
Potassium: 3.7 mmol/L (ref 3.5–5.1)
SODIUM: 138 mmol/L (ref 135–145)

## 2015-12-08 LAB — CULTURE, RESPIRATORY W GRAM STAIN

## 2015-12-08 LAB — CBC
HCT: 36.4 % — ABNORMAL LOW (ref 39.0–52.0)
Hemoglobin: 12.3 g/dL — ABNORMAL LOW (ref 13.0–17.0)
MCH: 31.6 pg (ref 26.0–34.0)
MCHC: 33.8 g/dL (ref 30.0–36.0)
MCV: 93.6 fL (ref 78.0–100.0)
PLATELETS: 415 10*3/uL — AB (ref 150–400)
RBC: 3.89 MIL/uL — AB (ref 4.22–5.81)
RDW: 12.7 % (ref 11.5–15.5)
WBC: 8.9 10*3/uL (ref 4.0–10.5)

## 2015-12-08 LAB — PROTIME-INR
INR: 1.61 — AB (ref 0.00–1.49)
Prothrombin Time: 19.2 seconds — ABNORMAL HIGH (ref 11.6–15.2)

## 2015-12-08 LAB — CULTURE, RESPIRATORY

## 2015-12-08 LAB — HEPARIN LEVEL (UNFRACTIONATED)
Heparin Unfractionated: 0.72 IU/mL — ABNORMAL HIGH (ref 0.30–0.70)
Heparin Unfractionated: 0.62 IU/mL (ref 0.30–0.70)

## 2015-12-08 LAB — MAGNESIUM: MAGNESIUM: 2 mg/dL (ref 1.7–2.4)

## 2015-12-08 MED ORDER — WARFARIN SODIUM 5 MG PO TABS
5.0000 mg | ORAL_TABLET | Freq: Once | ORAL | Status: AC
  Administered 2015-12-08: 5 mg via ORAL
  Filled 2015-12-08: qty 1

## 2015-12-08 MED ORDER — POTASSIUM CHLORIDE CRYS ER 20 MEQ PO TBCR
40.0000 meq | EXTENDED_RELEASE_TABLET | Freq: Once | ORAL | Status: AC
  Administered 2015-12-08: 40 meq via ORAL
  Filled 2015-12-08: qty 2

## 2015-12-08 MED ORDER — LEVOFLOXACIN IN D5W 750 MG/150ML IV SOLN
750.0000 mg | INTRAVENOUS | Status: DC
  Administered 2015-12-08 – 2015-12-09 (×2): 750 mg via INTRAVENOUS
  Filled 2015-12-08 (×3): qty 150

## 2015-12-08 MED ORDER — INFLUENZA VAC SPLIT QUAD 0.5 ML IM SUSY
0.5000 mL | PREFILLED_SYRINGE | INTRAMUSCULAR | Status: DC
  Filled 2015-12-08 (×2): qty 0.5

## 2015-12-08 NOTE — Progress Notes (Signed)
Patient Demographics  Brian Martinez, is a 65 y.o. male, DOB - 08/25/1951, [id number]  Admit date - 12/03/2015   Admitting Physician Vianne Bulls, MD  Outpatient Primary MD for the patient is Dorothy Spark, MD  LOS - 5   Chief Complaint  Patient presents with  . URI  . Near Syncope       Admission HPI/Brief narrative: 65 yo with history of hypertension, severe mitral regurgitation secondary to prolapse, PVCs, Presents with palpitation, dyspnea and presyncope, in ED workup significant for CAP, and new onset A. fib with RVR, Patient seen by cardiology, heart rate better controlled after starting Cardizem, beta blockers and Digoxin, currently on IV heparin drip transitioned to warfarin given A. fib thought to be secondary to valvular heart disease. - Patient initially treated with Rocephin and azithromycin for CAP, but sputum culture growing Streptococcus intermediate sensitivity to Rocephin, changed to levofloxacin.  Subjective:   Brian Martinez today has, No headache, No chest pain, No abdominal pain - Reports dyspnea is improving,Cough significantly subsided . Assessment & Plan    Principal Problem:   Atrial fibrillation with RVR ([facility]) Active Problems:   MVP (mitral valve prolapse)   Severe mitral regurgitation   Hyperlipidemia   Hypertensive heart disease   AKI (acute kidney injury) ([place])   CAP (community acquired pneumonia)   Thrombocytosis ([place])   Essential hypertension  Atrial fibrillation with RVR  - CHADS-VASc score is 2 for age >49 and HTN  - TTE (07/30/2015) with severe LA dilation, suggesting this it was  chronic  - Initially on Cardizem drip, Management per cardiology, I tried hard to control any initially, currently well controlled with Cardizem CD, metoprolol and digoxin. - Monitor on telemetry  - Currently on heparin GTT, started on warfarin, pharmacy to dose  warfarin, will need to be on warfarin given his MV prolapse, and Severe MR. - Monitor potassium and magnesium and replace  CAP  - RLL infiltrate noted on CXR - Leukocytosis of 21k on admission , normalized -Initially on IV Rocephin and azithromycin, sputum culture came back today for Streptococcus pneumonia intermediate sensitivity to Rocephin, was transitioned to levofloxacin, to finish total of 5 days of levofloxacin as discussed with ID ,  can be transitioned to by mouth in a.m. Prior To discharge - Follow-up imaging recommended to exclude underlying mass  . AKI  - SCr 1.43 on admission, up from 0.74 at last check 6 months  prior . -Resolved.   Hypertension  - Previously on Losartan, but pt stopped taking  - Currently on Cardizem CD, metoprolol, blood pressure is well controlled, actually in the on the lower side this a.m., will continue to observe.  Thrombocytosis  - Platelet count 466,000 on admission  - Likely representing an acute phase reactant in setting of PNA and new-onset a fib RVR  - Resolved  MVP with flail leaflet and severe MR  - Demonstrated on TTE (07/30/15)  - Has consulted with Dr. Roxy Manns of [place] and is planning tentatively for operative repair   Hypokalemia - Repleted, monitor closely  Code Status: Full  Family Communication: None at bedside  Disposition Plan: Hopefully can be discharged in a.m. if continues to improve.   Procedures  None   Consults  Cardiology   Medications  Scheduled Meds: . antiseptic oral rinse  7 mL Mouth Rinse BID  . cholecalciferol  5,000 Units Oral Daily  . digoxin  0.125 mg Oral Daily  . diltiazem  360 mg Oral Daily  . feeding supplement (ENSURE ENLIVE)  237 mL Oral BID BM  . guaiFENesin  1,200 mg Oral BID  . [START ON 12/09/2015] Influenza vac split quadrivalent PF  0.5 mL Intramuscular Tomorrow-1000  . levofloxacin (LEVAQUIN) IV  750 mg Intravenous Q24H  . metoprolol tartrate  100 mg Oral BID  .  potassium chloride  40 mEq Oral Once  . vitamin B-12  100 mcg Oral Daily  . warfarin  5 mg Oral ONCE-1800  . Warfarin - Pharmacist Dosing Inpatient   Does not apply q1800   Continuous Infusions: . sodium chloride 75 mL/hr at 12/08/15 0515  . heparin 1,600 Units/hr (12/08/15 0753)   PRN Meds:.acetaminophen, dextromethorphan, diltiazem, ondansetron (ZOFRAN) IV  DVT Prophylaxis  Heparin GTT  Lab Results  Component Value Date   PLT 415* 12/08/2015    Antibiotics    Anti-infectives    Start     Dose/Rate Route Frequency Ordered Stop   12/08/15 1600  levofloxacin (LEVAQUIN) IVPB 750 mg     750 mg 100 mL/hr over 90 Minutes Intravenous Every 24 hours 12/08/15 1343     12/04/15 2000  cefTRIAXone (ROCEPHIN) 1 g in dextrose 5 % 50 mL IVPB  Status:  Discontinued     1 g 100 mL/hr over 30 Minutes Intravenous Every 24 hours 12/03/15 2353 12/08/15 1343   12/04/15 1000  azithromycin (ZITHROMAX) tablet 250 mg     250 mg Oral Daily 12/03/15 2128 12/07/15 0902   12/03/15 2130  cefTRIAXone (ROCEPHIN) 1 g in dextrose 5 % 50 mL IVPB     1 g 100 mL/hr over 30 Minutes Intravenous  Once 12/03/15 2128 12/03/15 2229   12/03/15 2130  azithromycin (ZITHROMAX) tablet 500 mg     500 mg Oral Daily 12/03/15 2128 12/03/15 2152          Objective:   Filed Vitals:   12/07/15 2201 12/08/15 0436 12/08/15 0955 12/08/15 1333  BP: 128/73 120/72 127/83 92/60  Pulse: 102 86 121 84  Temp: 97.5 F (36.4 C) 97.7 F (36.5 C)  98.3 F (36.8 C)  TempSrc: Oral Oral  Oral  Resp: 20 20  18   Height:      Weight:  69.1 kg (152 lb 5.4 oz)    SpO2: 99% 97% 98% 98%    Wt Readings from Last 3 Encounters:  12/08/15 69.1 kg (152 lb 5.4 oz)  09/04/15 71.215 kg (157 lb)  08/11/15 71.668 kg (158 lb)     Intake/Output Summary (Last 24 hours) at 12/08/15 1721 Last data filed at 12/08/15 1603  Gross per 24 hour  Intake 3100.19 ml  Output   1050 ml  Net 2050.19 ml     Physical Exam  Awake Alert, Oriented X  3, No new F.N deficits, Normal affect [place].AT,PERRAL Supple Neck,No JVD, No cervical lymphadenopathy appriciated.  Symmetrical Chest wall movement, Good air movement bilaterally, No wheezing Irregular,No Gallops,Rubs, murmur +, No Parasternal Heave +ve B.Sounds, Abd Soft, No tenderness, No organomegaly appriciated, No rebound - guarding or rigidity. No Cyanosis, Clubbing or edema, No new Rash or bruise     Data Review   Micro Results Recent Results (from the past 240 hour(s))  MRSA PCR Screening     Status: None   Collection  Time: 12/04/15  1:49 AM  Result Value Ref Range Status   MRSA by PCR NEGATIVE NEGATIVE Final    Comment:        The GeneXpert MRSA Assay (FDA approved for NASAL specimens only), is one component of a comprehensive MRSA colonization surveillance program. It is not intended to diagnose MRSA infection nor to guide or monitor treatment for MRSA infections.   Culture, expectorated sputum-assessment     Status: None   Collection Time: 12/04/15 10:45 AM  Result Value Ref Range Status   Specimen Description SPUTUM  Final   Special Requests NONE  Final   Sputum evaluation   Final    THIS SPECIMEN IS ACCEPTABLE. RESPIRATORY CULTURE REPORT TO FOLLOW.   Report Status 12/04/2015 FINAL  Final  Culture, respiratory (NON-Expectorated)     Status: None   Collection Time: 12/04/15 10:45 AM  Result Value Ref Range Status   Specimen Description SPUTUM  Final   Special Requests NONE  Final   Gram Stain   Final    ABUNDANT WBC PRESENT, PREDOMINANTLY PMN RARE SQUAMOUS EPITHELIAL CELLS PRESENT ABUNDANT GRAM POSITIVE COCCI IN PAIRS FEW GRAM POSITIVE RODS RARE YEAST WITH PSEUDOHYPHAE Performed at [facility]    Culture   Final    MODERATE STREPTOCOCCUS PNEUMONIAE Performed at [facility]    Report Status 12/08/2015 FINAL  Final   Organism ID, Bacteria STREPTOCOCCUS PNEUMONIAE  Final      Susceptibility   Streptococcus pneumoniae - MIC (ETEST)*     CEFTRIAXONE 1.5 INTERMEDIATE Intermediate     CLINDAMYCIN 256 RESISTANT Resistant     CLARITHROMYCIN 6 RESISTANT Resistant     CEFOTAXIME 2.0 INTERMEDIATE Intermediate     LEVOFLOXACIN 1.0 SENSITIVE Sensitive     PENICILLIN 4 RESISTANT Resistant     VANCOMYCIN 0.25 SENSITIVE Sensitive     * MODERATE STREPTOCOCCUS PNEUMONIAE    Radiology Reports Dg Chest Port 1 View  12/03/2015  CLINICAL DATA:  Nasal and chest congestion EXAM: PORTABLE CHEST 1 VIEW COMPARISON:  None. FINDINGS: There is mild opacity in the right base compared to the left. No other pulmonary opacities. No pneumothorax. No pulmonary nodules or masses. Mild cardiomegaly. The hila and mediastinum are normal. IMPRESSION: Right lower lobe infiltrate.  Recommend follow-up to resolution. Electronically Signed   By: Dorise Bullion III M.D   On: 12/03/2015 16:39     CBC  Recent Labs Lab 12/04/15 [id number] 12/05/15 0356 12/06/15 0408 12/07/15 0508 12/08/15 0539  WBC 14.0* 15.2* 13.3* 10.5 8.9  HGB 11.8* 12.0* 12.6* 12.3* 12.3*  HCT 35.0* 34.5* 36.0* 36.6* 36.4*  PLT 298 313 419* 417* 415*  MCV 90.2 89.4 89.8 93.8 93.6  MCH 30.4 31.1 31.4 31.5 31.6  MCHC 33.7 34.8 35.0 33.6 33.8  RDW 12.5 12.4 12.2 12.6 12.7  LYMPHSABS 1.9  --   --   --   --   MONOABS 1.0  --   --   --   --   EOSABS 0.0  --   --   --   --   BASOSABS 0.0  --   --   --   --     Chemistries   Recent Labs Lab 12/04/15 0001 12/04/15 0312 12/05/15 0356 12/06/15 0408 12/07/15 0508 12/08/15 0539  NA  --  137 134* 137 135 138  K  --  3.6 3.2* 3.7 3.8 3.7  CL  --  102 103 105 106 107  CO2  --  25 23 23  22 23  GLUCOSE  --  113* 114* 118* 98 101*  BUN  --  56* 38* 30* 23* 18  CREATININE  --  0.92 0.76 0.70 0.69 0.70  CALCIUM  --  8.4* 8.3* 8.3* 8.1* 8.1*  MG 2.2  --  1.8  --  1.7 2.0   ------------------------------------------------------------------------------------------------------------------ estimated creatinine clearance is 88.8 mL/min (by C-G  formula based on Cr of 0.7). ------------------------------------------------------------------------------------------------------------------ No results for input(s): HGBA1C in the last 72 hours. ------------------------------------------------------------------------------------------------------------------ No results for input(s): CHOL, HDL, LDLCALC, TRIG, CHOLHDL, LDLDIRECT in the last 72 hours. ------------------------------------------------------------------------------------------------------------------ No results for input(s): TSH, T4TOTAL, T3FREE, THYROIDAB in the last 72 hours.  Invalid input(s): FREET3 ------------------------------------------------------------------------------------------------------------------ No results for input(s): VITAMINB12, FOLATE, FERRITIN, TIBC, IRON, RETICCTPCT in the last 72 hours.  Coagulation profile  Recent Labs Lab 12/04/15 0001 12/05/15 0356 12/06/15 0408 12/07/15 0508 12/08/15 0539  INR 1.25 1.16 1.19 1.40 1.61*    No results for input(s): DDIMER in the last 72 hours.  Cardiac Enzymes No results for input(s): CKMB, TROPONINI, MYOGLOBIN in the last 168 hours.  Invalid input(s): CK ------------------------------------------------------------------------------------------------------------------ Invalid input(s): POCBNP     Time Spent in minutes   25 minutes   Hutch Rhett M.D on 12/08/2015 at 5:21 PM  Between 7am to 7pm - Pager - [phone]  After 7pm go to [email or web address] - password [facility]  [facility]   Office  [phone]

## 2015-12-08 NOTE — Progress Notes (Signed)
Pt BP 92/47.  Pt resting in chair, denies SOB/dizziness/lightheadedness.  Dr Waldron Labs aware.  Will monitor. No new orders.

## 2015-12-08 NOTE — Progress Notes (Signed)
Patient Name: Brian Martinez Date of Encounter: 12/08/2015  Primary Cardiologist: Dr. Meda Coffee   Principal Problem:   Atrial fibrillation with RVR ([place]) Active Problems:   MVP (mitral valve prolapse)   Severe mitral regurgitation   Hyperlipidemia   Hypertensive heart disease   AKI (acute kidney injury) ([place])   CAP (community acquired pneumonia)   Thrombocytosis ([place])   Essential hypertension    SUBJECTIVE  Denies any CP. No cardiac awareness of fluttering sensation. Has some positional dizziness. Wants to go home  CURRENT MEDS . antiseptic oral rinse  7 mL Mouth Rinse BID  . cefTRIAXone (ROCEPHIN)  IV  1 g Intravenous Q24H  . cholecalciferol  5,000 Units Oral Daily  . digoxin  0.125 mg Oral Daily  . diltiazem  360 mg Oral Daily  . feeding supplement (ENSURE ENLIVE)  237 mL Oral BID BM  . guaiFENesin  1,200 mg Oral BID  . metoprolol tartrate  100 mg Oral BID  . vitamin B-12  100 mcg Oral Daily  . Warfarin - Pharmacist Dosing Inpatient   Does not apply q1800    OBJECTIVE  Filed Vitals:   12/07/15 1526 12/07/15 1528 12/07/15 2201 12/08/15 0436  BP:  134/70 128/73 120/72  Pulse:  112 102 86  Temp:  97.4 F (36.3 C) 97.5 F (36.4 C) 97.7 F (36.5 C)  TempSrc:  Oral Oral Oral  Resp:  20 20 20   Height:      Weight: 152 lb 8.9 oz (69.2 kg)   152 lb 5.4 oz (69.1 kg)  SpO2:  97% 99% 97%    Intake/Output Summary (Last 24 hours) at 12/08/15 0742 Last data filed at 12/08/15 0400  Gross per 24 hour  Intake    590 ml  Output    550 ml  Net     40 ml   Filed Weights   12/06/15 0500 12/07/15 1526 12/08/15 0436  Weight: 148 lb 13 oz (67.5 kg) 152 lb 8.9 oz (69.2 kg) 152 lb 5.4 oz (69.1 kg)    PHYSICAL EXAM  General: Pleasant, NAD. Neuro: Alert and oriented X 3. Moves all extremities spontaneously. Psych: Normal affect. HEENT:  Normal  Neck: Supple without bruits or JVD. Lungs:  Resp regular and unlabored, CTA. Heart: irregular, tachycardic. no s3, s4, or  murmurs. Abdomen: Soft, non-tender, non-distended, BS + x 4.  Extremities: No clubbing, cyanosis or edema. DP/PT/Radials 2+ and equal bilaterally.  Accessory Clinical Findings  CBC  Recent Labs  12/07/15 0508 12/08/15 0539  WBC 10.5 8.9  HGB 12.3* 12.3*  HCT 36.6* 36.4*  MCV 93.8 93.6  PLT 417* [id number]*   Basic Metabolic Panel  Recent Labs  12/07/15 0508 12/08/15 0539  NA 135 138  K 3.8 3.7  CL 106 107  CO2 22 23  GLUCOSE 98 101*  BUN 23* 18  CREATININE 0.69 0.70  CALCIUM 8.1* 8.1*  MG 1.7 2.0    TELE afib with HR low 100s    ECG  No new EKG  Echocardiogram 07/30/2015  LV EF: 60% - 65%  ------------------------------------------------------------------- Indications: Abnormal EKG (R94.31).  ------------------------------------------------------------------- Study Conclusions  - Left ventricle: There was moderate concentric hypertrophy.  Systolic function was normal. The estimated ejection fraction was  in the range of 60% to 65%. The study is not technically  sufficient to allow evaluation of LV diastolic function. - Aortic valve: There was trivial regurgitation. - Mitral valve: Moderate prolapse and partially flail leaflet  involving the posterior leaflet. There was  severe regurgitation  directed anteriorly. - Left atrium: The atrium was moderately to severely dilated.    Radiology/Studies  Dg Chest Port 1 View  12/03/2015  CLINICAL DATA:  Nasal and chest congestion EXAM: PORTABLE CHEST 1 VIEW COMPARISON:  None. FINDINGS: There is mild opacity in the right base compared to the left. No other pulmonary opacities. No pneumothorax. No pulmonary nodules or masses. Mild cardiomegaly. The hila and mediastinum are normal. IMPRESSION: Right lower lobe infiltrate.  Recommend follow-up to resolution. Electronically Signed   By: Dorise Bullion III M.D   On: 12/03/2015 16:39    ASSESSMENT AND PLAN  65 yo male with PMH of HTN, severe MR, PVCs  presented with PNA and found to have new afib with RVR  1. Newly diagnosed atrial fibrillation with RVR  - CHA2DS2-Vasc score 2 (age, HTN), given severe MR, coumadin for anticoagulation   - success of cardioversion low given moderately to severely dilated LA on echo 07/30/2015, EF normal  - continue 360mg  Diltiazem CD, metoprolol tartrate increased to 100mg  BID yesterday and digoxin 0.125mg  daily added. Rate better controlled today. He had one episode with HRs up in 150s this AM. HR low 100s currently but due for AM meds.   -  ASA discontinued and started on coumadin. INR 1.6. Will continue to load and have him set up in our coumadin clinic this Friday.    2. RLL PNA: per IM  3. Severe MR: followed by Dr. Roxy Manns, last seen in 07/2015, who recommended surgery, but he wished to think about it. Afib is an indication that his mitral valve should be repaired. When he recovers from his PNA, he will need a diagnostic cardiac cath and then need to be seen back by Dr. Roxy Manns.  May need MAZE and/or LAA clip if decided to go further with surgery.   4. HTN: controlled in the setting of rate control for afib.   5. Dizziness: he complains of dizziness when he sits up and move his head in a certain position. BPPV? Patient very eager to go home.   Dispo: coumadin clinic appt set up for Friday 12/11/15 and office visit by Dr. Meda Coffee on 12/14/15  SignedEileen Stanford PA-C Pager: [phone]   I have examined the patient and reviewed assessment and plan and discussed with patient.  Agree with above as stated.  INR increasing.  Spoke about importance of valve repair.  Known partial flail posterior leaflet from prior echo.  I explained that he could have irreversible LV dysfunction if he waits too long.  LA was severely dilated.  Brian Rovira S.

## 2015-12-08 NOTE — Clinical Documentation Improvement (Signed)
Cardiology Internal Medicine  Multiple Notes document "MVP with flail leaflet". Does "flail leaflet" equate to "ruptured chordae tendonae"?  Please exercise your independent, professional judgment when responding. A specific answer is not anticipated or expected.  Thank You, Ezekiel Ina RN [place] [phone]

## 2015-12-08 NOTE — Progress Notes (Addendum)
[facility] for Heparin-->Coumadin Indication: atrial fibrillation  No Known Allergies  Patient Measurements: Height: 6\' 1"  (185.4 cm) Weight: 152 lb 5.4 oz (69.1 kg) IBW/kg (Calculated) : 79.9 HEPARIN DW (KG): 65.5   Vital Signs: Temp: 97.7 F (36.5 C) (03/21 0436) Temp Source: Oral (03/21 0436) BP: 120/72 mmHg (03/21 0436) Pulse Rate: 86 (03/21 0436)  Labs:  Recent Labs  12/06/15 0408  12/06/15 1848 12/07/15 0508 12/07/15 0509 12/08/15 0539  HGB 12.6*  --   --  12.3*  --  12.3*  HCT 36.0*  --   --  36.6*  --  36.4*  PLT 419*  --   --  417*  --  415*  LABPROT 15.3*  --   --  17.3*  --  19.2*  INR 1.19  --   --  1.40  --  1.61*  HEPARINUNFRC  --   < > 0.51  --  0.54 0.72*  CREATININE 0.70  --   --  0.69  --  0.70  < > = values in this interval not displayed.  Estimated Creatinine Clearance: 88.8 mL/min (by C-G formula based on Cr of 0.7).   Medical History: Past Medical History  Diagnosis Date  . Heart murmur   . Severe mitral regurgitation 07/30/2015  . MVP (mitral valve prolapse) 06/10/2015  . Abnormal EKG 06/10/2015  . PVC (premature ventricular contraction) 06/10/2015    Medications:  Infusions:  . sodium chloride 75 mL/hr at 12/08/15 0515  . heparin 1,650 Units/hr (12/08/15 0405)    Assessment: Patient's a 63 M with hx severe MR who presented to the ED on 3/16 with new onset afib.  Not on oral anticoagulant PTA. He's currently on heparin and warfarin started on 3/18. Baseline INR 1.25.    12/08/2015:   Heparin level slightly supra-therapeutic at 0.72 this morning  INR is sub-therapeutic but increasing nicely to 1.61 today   CBC stable.  No bleeding documented  Drug-drug Interactions: antibiotic may increase INR  Goal of Therapy:  INR 2-3 Heparin level 0.3-0.7 units/ml Monitor platelets by anticoagulation protocol: Yes    Plan:   Decrease heparin gtt to 1600 units/hr  Check 6 hour heparin  level  Repeat Coumadin 5mg  po x1 today  Monitor for s/s bleeding  Dia Sitter, PharmD, BCPS 12/08/2015 7:51 AM   Adden: heparin level now back therapeutic at 0.62 after rate decreased to 1600 units/hr this morning.  Continue current rate. Dia Sitter, PharmD, BCPS 12/08/2015 2:56 PM

## 2015-12-08 NOTE — Care Management Important Message (Signed)
Important Message  Patient Details  Name: Brian Martinez MRN: [id number] Date of Birth: 1951/08/07   Medicare Important Message Given:  Yes    Camillo Flaming 12/08/2015, 11:16 AMImportant Message  Patient Details  Name: Brian Martinez MRN: [id number] Date of Birth: 08-06-1951   Medicare Important Message Given:  Yes    Camillo Flaming 12/08/2015, 11:16 AM

## 2015-12-09 DIAGNOSIS — E785 Hyperlipidemia, unspecified: Secondary | ICD-10-CM

## 2015-12-09 DIAGNOSIS — R0789 Other chest pain: Secondary | ICD-10-CM

## 2015-12-09 DIAGNOSIS — J189 Pneumonia, unspecified organism: Secondary | ICD-10-CM | POA: Insufficient documentation

## 2015-12-09 LAB — CBC
HCT: 35.9 % — ABNORMAL LOW (ref 39.0–52.0)
HEMOGLOBIN: 12 g/dL — AB (ref 13.0–17.0)
MCH: 31.5 pg (ref 26.0–34.0)
MCHC: 33.4 g/dL (ref 30.0–36.0)
MCV: 94.2 fL (ref 78.0–100.0)
PLATELETS: 395 10*3/uL (ref 150–400)
RBC: 3.81 MIL/uL — AB (ref 4.22–5.81)
RDW: 13 % (ref 11.5–15.5)
WBC: 8.5 10*3/uL (ref 4.0–10.5)

## 2015-12-09 LAB — HEPARIN LEVEL (UNFRACTIONATED): HEPARIN UNFRACTIONATED: 0.66 [IU]/mL (ref 0.30–0.70)

## 2015-12-09 LAB — PROTIME-INR
INR: 1.59 — AB (ref 0.00–1.49)
PROTHROMBIN TIME: 19 s — AB (ref 11.6–15.2)

## 2015-12-09 LAB — BASIC METABOLIC PANEL
Anion gap: 8 (ref 5–15)
BUN: 18 mg/dL (ref 6–20)
CALCIUM: 8.2 mg/dL — AB (ref 8.9–10.3)
CHLORIDE: 107 mmol/L (ref 101–111)
CO2: 23 mmol/L (ref 22–32)
CREATININE: 0.66 mg/dL (ref 0.61–1.24)
Glucose, Bld: 97 mg/dL (ref 65–99)
Potassium: 3.8 mmol/L (ref 3.5–5.1)
SODIUM: 138 mmol/L (ref 135–145)

## 2015-12-09 MED ORDER — BOOST / RESOURCE BREEZE PO LIQD
1.0000 | Freq: Two times a day (BID) | ORAL | Status: DC
  Administered 2015-12-09 – 2015-12-10 (×3): 1 via ORAL

## 2015-12-09 MED ORDER — AMIODARONE HCL 200 MG PO TABS
400.0000 mg | ORAL_TABLET | Freq: Two times a day (BID) | ORAL | Status: DC
  Administered 2015-12-09 – 2015-12-10 (×3): 400 mg via ORAL
  Filled 2015-12-09 (×3): qty 2

## 2015-12-09 MED ORDER — POTASSIUM CHLORIDE CRYS ER 20 MEQ PO TBCR
40.0000 meq | EXTENDED_RELEASE_TABLET | Freq: Once | ORAL | Status: AC
  Administered 2015-12-09: 40 meq via ORAL
  Filled 2015-12-09: qty 2

## 2015-12-09 MED ORDER — WARFARIN SODIUM 7.5 MG PO TABS
7.5000 mg | ORAL_TABLET | Freq: Once | ORAL | Status: AC
  Administered 2015-12-09: 7.5 mg via ORAL
  Filled 2015-12-09: qty 1

## 2015-12-09 MED ORDER — WARFARIN SODIUM 5 MG PO TABS
5.0000 mg | ORAL_TABLET | Freq: Once | ORAL | Status: DC
  Filled 2015-12-09: qty 1

## 2015-12-09 NOTE — Progress Notes (Signed)
Pt had 5 beat run of VT while napping.  Asymptomatic.   Dr Ree Kida aware.  Will place KCL orders.

## 2015-12-09 NOTE — Progress Notes (Signed)
[facility]                                                                              Patient Demographics  Brian Martinez, is a 65 y.o. male, DOB - 09-22-1950, [id number]  Admit date - 12/03/2015   Admitting Physician Brian Bulls, MD  Outpatient Primary MD for the patient is Brian Spark, MD  LOS - 6   Chief Complaint  Patient presents with  . URI  . Near Syncope      Interim history 65 yo with history of hypertension, severe mitral regurgitation secondary to prolapse, PVCs, Presents with palpitation, dyspnea and presyncope, in ED workup significant for CAP, and new onset A. fib with RVR, Patient seen by cardiology, heart rate better controlled after starting Cardizem, beta blockers and Digoxin, currently on IV heparin drip transitioned to warfarin given A. fib thought to be secondary to valvular heart disease. Patient initially treated with Rocephin and azithromycin for CAP, but sputum culture growing Streptococcus intermediate sensitivity to Rocephin, changed to levofloxacin. Waiting for INR to become therapeutic.  Recently started on amio by cardilogy.  Assessment & Plan  Atrial fibrillation with RVR  -CHADS-VASc score is 2 for age >30 and HTN  -TTE (07/30/2015) with severe LA dilation, suggesting this it was chronic  -Initially on Cardizem drip, Management per cardiology, I tried hard to control any initially, currently well controlled with Cardizem CD, metoprolol and digoxin. -Amiodarone started today by cardiology -Continue heparin gtt and coumadin, INR 1.59 -Patient also has MVP and Sever MR- will need surgery at some point  CAP  -RLL infiltrate noted on CXR -Leukocytosis of 21k on admission, has normalized -Initially on IV Rocephin and azithromycin, sputum culture came back today for Streptococcus pneumonia intermediate sensitivity to Rocephin, was transitioned to levofloxacin, to finish total of 5 days of levofloxacin (previous hospitalist  discussed with ID) can be transitioned to by mouth in a.m. -Follow-up imaging recommended to exclude underlying mass  . AKI  -SCr 1.43 on admission, up from 0.74 at last check 6 months prior . -Resolved.  Hypertension  -Previously on Losartan, but pt stopped taking  -urrently on Cardizem CD, metoprolol, blood pressure is well controlled, actually in the on the lower side this a.m., will continue to observe.  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 Roxy Martinez of [place] and is planning tentatively for operative repair   Hypokalemia -Repleted, monitor closely  NSVT -patient had 5beat run of VTach -Magnesium 2, potassium 3.8 -Will give additional dose of potassium and continue to montior -patient asymptomatic   Code Status: Full   Family Communication: none at bedsdie  Disposition Plan: Admitted.  Pending therapeutic INR.  Time Spent in minutes   30 minutes  Procedures  None  Consults   Cardiology  DVT Prophylaxis  Coumadin/heparin  Lab Results  Component Value Date   PLT 395 12/09/2015    Medications  Scheduled Meds: . amiodarone  400 mg Oral BID  . antiseptic  oral rinse  7 mL Mouth Rinse BID  . cholecalciferol  5,000 Units Oral Daily  . digoxin  0.125 mg Oral Daily  . diltiazem  360 mg Oral Daily  . feeding supplement (ENSURE ENLIVE)  237 mL Oral BID BM  . guaiFENesin  1,200 mg Oral BID  . Influenza vac split quadrivalent PF  0.5 mL Intramuscular Tomorrow-1000  . levofloxacin (LEVAQUIN) IV  750 mg Intravenous Q24H  . metoprolol tartrate  100 mg Oral BID  . potassium chloride  40 mEq Oral Once  . vitamin B-12  100 mcg Oral Daily  . warfarin  7.5 mg Oral ONCE-1800  . Warfarin - Pharmacist Dosing Inpatient   Does not apply q1800   Continuous  Infusions: . sodium chloride 75 mL/hr at 12/09/15 1039  . heparin 1,600 Units/hr (12/08/15 2127)   PRN Meds:.acetaminophen, dextromethorphan, diltiazem, ondansetron (ZOFRAN) IV  Antibiotics    Anti-infectives    Start     Dose/Rate Route Frequency Ordered Stop   12/08/15 1600  levofloxacin (LEVAQUIN) IVPB 750 mg     750 mg 100 mL/hr over 90 Minutes Intravenous Every 24 hours 12/08/15 1343     12/04/15 2000  cefTRIAXone (ROCEPHIN) 1 g in dextrose 5 % 50 mL IVPB  Status:  Discontinued     1 g 100 mL/hr over 30 Minutes Intravenous Every 24 hours 12/03/15 2353 12/08/15 1343   12/04/15 1000  azithromycin (ZITHROMAX) tablet 250 mg     250 mg Oral Daily 12/03/15 2128 12/07/15 0902   12/03/15 2130  cefTRIAXone (ROCEPHIN) 1 g in dextrose 5 % 50 mL IVPB     1 g 100 mL/hr over 30 Minutes Intravenous  Once 12/03/15 2128 12/03/15 2229   12/03/15 2130  azithromycin (ZITHROMAX) tablet 500 mg     500 mg Oral Daily 12/03/15 2128 12/03/15 2152        Subjective:   Brian Martinez seen and examined today. Patient very upset today and would like to go home.  Feels his breathing has improved, no longer complains of cough.  Denies chest pain, dizziness, headache, abdominal pain.  Objective:   Filed Vitals:   12/08/15 1333 12/08/15 2126 12/09/15 0546 12/09/15 0937  BP: 92/60 129/78 125/79 137/79  Pulse: 84 96 98 116  Temp: 98.3 F (36.8 C) 98.3 F (36.8 C) 97.7 F (36.5 C)   TempSrc: Oral Oral Oral   Resp: 18 16 18    Height:      Weight:   69.5 kg (153 lb 3.5 oz)   SpO2: 98% 97% 99% 98%    Wt Readings from Last 3 Encounters:  12/09/15 69.5 kg (153 lb 3.5 oz)  09/04/15 71.215 kg (157 lb)  08/11/15 71.668 kg (158 lb)     Intake/Output Summary (Last 24 hours) at 12/09/15 1204 Last data filed at 12/09/15 1039  Gross per 24 hour  Intake 3085.64 ml  Output    675 ml  Net 2410.64 ml    Exam  General: Well developed, well nourished, NAD  HEENT: NCAT, mucous membranes moist.    Cardiovascular: S1 S2 auscultated, irregularly irregular, 1/6SEM  Respiratory: Clear to auscultation bilaterally with equal chest rise  Abdomen: Soft, nontender, nondistended, + bowel sounds  Extremities: warm dry without cyanosis clubbing or edema  Neuro: AAOx3, nonfocal  Psych: Flat affect and demeanor  Data Review   Micro Results Recent Results (from the past 240 hour(s))  MRSA PCR Screening     Status: None   Collection Time: 12/04/15  1:49 AM  Result Value Ref Range Status   MRSA by PCR NEGATIVE NEGATIVE Final    Comment:        The GeneXpert MRSA Assay (FDA approved for NASAL specimens only), is one component of a comprehensive MRSA colonization surveillance program. It is not intended to diagnose MRSA infection nor to guide or monitor treatment for MRSA infections.   Culture, expectorated sputum-assessment     Status: None   Collection Time: 12/04/15 10:45 AM  Result Value Ref Range Status   Specimen Description SPUTUM  Final   Special Requests NONE  Final   Sputum evaluation   Final    THIS SPECIMEN IS ACCEPTABLE. RESPIRATORY CULTURE REPORT TO FOLLOW.   Report Status 12/04/2015 FINAL  Final  Culture, respiratory (NON-Expectorated)     Status: None   Collection Time: 12/04/15 10:45 AM  Result Value Ref Range Status   Specimen Description SPUTUM  Final   Special Requests NONE  Final   Gram Stain   Final    ABUNDANT WBC PRESENT, PREDOMINANTLY PMN RARE SQUAMOUS EPITHELIAL CELLS PRESENT ABUNDANT GRAM POSITIVE COCCI IN PAIRS FEW GRAM POSITIVE RODS RARE YEAST WITH PSEUDOHYPHAE Performed at [facility]    Culture   Final    MODERATE STREPTOCOCCUS PNEUMONIAE Performed at [facility]    Report Status 12/08/2015 FINAL  Final   Organism ID, Bacteria STREPTOCOCCUS PNEUMONIAE  Final      Susceptibility   Streptococcus pneumoniae - MIC (ETEST)*    CEFTRIAXONE 1.5 INTERMEDIATE Intermediate     CLINDAMYCIN 256 RESISTANT Resistant      CLARITHROMYCIN 6 RESISTANT Resistant     CEFOTAXIME 2.0 INTERMEDIATE Intermediate     LEVOFLOXACIN 1.0 SENSITIVE Sensitive     PENICILLIN 4 RESISTANT Resistant     VANCOMYCIN 0.25 SENSITIVE Sensitive     * MODERATE STREPTOCOCCUS PNEUMONIAE    Radiology Reports Dg Chest Port 1 View  12/03/2015  CLINICAL DATA:  Nasal and chest congestion EXAM: PORTABLE CHEST 1 VIEW COMPARISON:  None. FINDINGS: There is mild opacity in the right base compared to the left. No other pulmonary opacities. No pneumothorax. No pulmonary nodules or masses. Mild cardiomegaly. The hila and mediastinum are normal. IMPRESSION: Right lower lobe infiltrate.  Recommend follow-up to resolution. Electronically Signed   By: Dorise Bullion III M.D   On: 12/03/2015 16:39    CBC  Recent Labs Lab 12/04/15 [id number] 12/05/15 0356 12/06/15 0408 12/07/15 0508 12/08/15 0539 12/09/15 0511  WBC 14.0* 15.2* 13.3* 10.5 8.9 8.5  HGB 11.8* 12.0* 12.6* 12.3* 12.3* 12.0*  HCT 35.0* 34.5* 36.0* 36.6* 36.4* 35.9*  PLT 298 313 419* 417* 415* 395  MCV 90.2 89.4 89.8 93.8 93.6 94.2  MCH 30.4 31.1 31.4 31.5 31.6 31.5  MCHC 33.7 34.8 35.0 33.6 33.8 33.4  RDW 12.5 12.4 12.2 12.6 12.7 13.0  LYMPHSABS 1.9  --   --   --   --   --   MONOABS 1.0  --   --   --   --   --   EOSABS 0.0  --   --   --   --   --   BASOSABS 0.0  --   --   --   --   --     Chemistries   Recent Labs Lab 12/04/15 0001  12/05/15 0356 12/06/15 0408 12/07/15 0508 12/08/15 0539 12/09/15 0511  NA  --   < > 134* 137 135 138 138  K  --   < >  3.2* 3.7 3.8 3.7 3.8  CL  --   < > 103 105 106 107 107  CO2  --   < > 23 23 22 23 23   GLUCOSE  --   < > 114* 118* 98 101* 97  BUN  --   < > 38* 30* 23* 18 18  CREATININE  --   < > 0.76 0.70 0.69 0.70 0.66  CALCIUM  --   < > 8.3* 8.3* 8.1* 8.1* 8.2*  MG 2.2  --  1.8  --  1.7 2.0  --   < > = values in this interval not  displayed. ------------------------------------------------------------------------------------------------------------------ estimated creatinine clearance is 89.3 mL/min (by C-G formula based on Cr of 0.66). ------------------------------------------------------------------------------------------------------------------ No results for input(s): HGBA1C in the last 72 hours. ------------------------------------------------------------------------------------------------------------------ No results for input(s): CHOL, HDL, LDLCALC, TRIG, CHOLHDL, LDLDIRECT in the last 72 hours. ------------------------------------------------------------------------------------------------------------------ No results for input(s): TSH, T4TOTAL, T3FREE, THYROIDAB in the last 72 hours.  Invalid input(s): FREET3 ------------------------------------------------------------------------------------------------------------------ No results for input(s): VITAMINB12, FOLATE, FERRITIN, TIBC, IRON, RETICCTPCT in the last 72 hours.  Coagulation profile  Recent Labs Lab 12/05/15 0356 12/06/15 0408 12/07/15 0508 12/08/15 0539 12/09/15 0511  INR 1.16 1.19 1.40 1.61* 1.59*    No results for input(s): DDIMER in the last 72 hours.  Cardiac Enzymes No results for input(s): CKMB, TROPONINI, MYOGLOBIN in the last 168 hours.  Invalid input(s): CK ------------------------------------------------------------------------------------------------------------------ Invalid input(s): POCBNP    Karah Caruthers D.O. on 12/09/2015 at 12:04 PM  Between 7am to 7pm - Pager - [phone]  After 7pm go to [email or web address] - password [email or web address]  And look for the night coverage person covering for me after hours  [facility] Office  [phone]

## 2015-12-09 NOTE — Progress Notes (Addendum)
[facility] for Heparin-->Coumadin Indication: atrial fibrillation  No Known Allergies  Patient Measurements: Height: 6\' 1"  (185.4 cm) Weight: 153 lb 3.5 oz (69.5 kg) IBW/kg (Calculated) : 79.9 HEPARIN DW (KG): 65.5   Vital Signs: Temp: 97.7 F (36.5 C) (03/22 0546) Temp Source: Oral (03/22 0546) BP: 125/79 mmHg (03/22 0546) Pulse Rate: 98 (03/22 0546)  Labs:  Recent Labs  12/07/15 0508  12/08/15 0539 12/08/15 1414 12/09/15 0511  HGB 12.3*  --  12.3*  --  12.0*  HCT 36.6*  --  36.4*  --  35.9*  PLT 417*  --  415*  --  395  LABPROT 17.3*  --  19.2*  --  19.0*  INR 1.40  --  1.61*  --  1.59*  HEPARINUNFRC  --   < > 0.72* 0.62 0.66  CREATININE 0.69  --  0.70  --  0.66  < > = values in this interval not displayed.  Estimated Creatinine Clearance: 89.3 mL/min (by C-G formula based on Cr of 0.66).   Medical History: Past Medical History  Diagnosis Date  . Heart murmur   . Severe mitral regurgitation 07/30/2015  . MVP (mitral valve prolapse) 06/10/2015  . Abnormal EKG 06/10/2015  . PVC (premature ventricular contraction) 06/10/2015    Medications:  Infusions:  . sodium chloride 75 mL/hr at 12/08/15 1958  . heparin 1,600 Units/hr (12/08/15 2127)    Assessment: Patient's a 23 M with hx severe MR who presented to the ED on 3/16 with new onset afib.  Not on oral anticoagulant PTA. He's currently on heparin and warfarin started on 3/18. Baseline INR 1.25.    12/09/2015:   Heparin level is therapeutic at 0.66   INR is sub-therapeutic at 1.59 today after 4 doses of 5mg    CBC stable.  No bleeding documented  Drug-drug Interactions: Levaquin started on 3/21  Goal of Therapy:  INR 2-3 Heparin level 0.3-0.7 units/ml Monitor platelets by anticoagulation protocol: Yes    Plan:   Continue heparin gtt to 1600 units/hr  Repeat Coumadin 5mg  po x1 today since patient is now on Levaquin.  If to discharge patient home today, recommend  to bridge with lovenox 70 mg SQ q12h and continue with coumadin 5mg  daily until he is seen at [facility] clinic on Friday.  Monitor for s/s bleeding  Dia Sitter, PharmD, BCPS 12/09/2015 9:24 AM   Adden: Dr. Ree Kida would like patient's INR to be higher for discharge.  Will increase dose slightly to 7.5 mg PO x1 today to help get patient closer to therapeutic range.  Will monitor INR closely while patient is on levaquin. Dia Sitter, PharmD, BCPS 12/09/2015 11:05 AM

## 2015-12-09 NOTE — Progress Notes (Signed)
Nutrition Follow-up  DOCUMENTATION CODES:   Severe malnutrition in context of chronic illness  INTERVENTION:  - Will d/c Ensure Enlive per previous request - Will order Boost Breeze BID, each supplement provides 250 kcal and 9 grams of protein - RD will continue to monitor for additional nutrition-related needs  NUTRITION DIAGNOSIS:   Malnutrition related to chronic illness as evidenced by severe depletion of body fat, severe depletion of muscle mass. -ongoing  GOAL:   Patient will meet greater than or equal to 90% of their needs -unable to assess status of goal at this time  MONITOR:   PO intake, Labs, I & O's, Weight trends  ASSESSMENT:   Brian Martinez is a 65 y.o. male with PMH of hypertension and mitral valve prolapse with flail leaflet and severe mitral regurgitation currently under evaluation by CT surgery and now presents to the ED with persistent palpitations and dyspnea with minimal exertion  3/22 Per chart review, pt ate 100% of lunch 3/21 and 75% of breakfast this AM. Pt out of the room at time of RD visit. Per cardiology note this AM, pt with new dx of Afib with RVR and possible need for mitral valve replacement.  Pt likely meeting minimal needs at this time. Will continue to monitor for POC and additional needs. Medications reviewed. IVF: NS @ 75 mL/hr. Labs reviewed; Ca: 8 mg/dL.    3/17 - Spoke with pt at bedside.  - He admits to poor appetite after his wife passed away a year ago.  - He dropped from around 170#-140#.  - Says he recovered and got his weight back up to about 158#, but has been steadily losing for 3 months or so now.  - Currently exhibits a 13#/8.2% severe wt loss in 3 months. - Within the past week, he states he was eating mostly frozen dinners or fast-food 2-3 times/day.  - This was with onset of shortness of breath, palpitations. - Prior to that, he was cooking regularly.  - Suggested boost/ensure to patient, he states he was doing  carnation instant breakfast but seemed to experience larger mucosal secretions while drinking it.  - He did not want to risk possible thicker secretions at this time.   Diet Order:  Diet Heart Room service appropriate?: Yes; Fluid consistency:: Thin  Skin:  Reviewed, no issues  Last BM:  3/22  Height:   Ht Readings from Last 1 Encounters:  12/04/15 6\' 1"  (1.854 m)    Weight:   Wt Readings from Last 1 Encounters:  12/09/15 153 lb 3.5 oz (69.5 kg)    Ideal Body Weight:  83.63 kg  BMI:  Body mass index is 20.22 kg/(m^2).  Estimated Nutritional Needs:   Kcal:  1650-1950 calories  Protein:  65-75 grams  Fluid:  >/= 1.65L  EDUCATION NEEDS:   Education needs addressed     Jarome Matin, RD, LDN Inpatient Clinical Dietitian Pager # [phone] After hours/weekend pager # [phone]

## 2015-12-09 NOTE — Progress Notes (Addendum)
SUBJECTIVE:  He is angry and frustrated.  OBJECTIVE:   Vitals:   Filed Vitals:   12/08/15 1333 12/08/15 2126 12/09/15 0546 12/09/15 0937  BP: 92/60 129/78 125/79 137/79  Pulse: 84 96 98 116  Temp: 98.3 F (36.8 C) 98.3 F (36.8 C) 97.7 F (36.5 C)   TempSrc: Oral Oral Oral   Resp: 18 16 18    Height:      Weight:   153 lb 3.5 oz (69.5 kg)   SpO2: 98% 97% 99% 98%   I&O's:   Intake/Output Summary (Last 24 hours) at 12/09/15 1015 Last data filed at 12/09/15 [id number]  Gross per 24 hour  Intake 2845.64 ml  Output    675 ml  Net 2170.64 ml   TELEMETRY: Reviewed telemetry pt in AFib with RVR:     PHYSICAL EXAM General: Well developed, well nourished, in no acute distress Head:   Normal cephalic and atramatic  Lungs:   Clear bilaterally to auscultation. Heart:  Tachycardic, irregularly irregular S1 S2  No JVD.   Abdomen: abdomen soft and non-tender Msk:  Back normal,  Normal strength and tone for age. Extremities:   No edema.   Neuro: Alert and oriented. Psych:  Normal affect, responds appropriately Skin: No rash   LABS: Basic Metabolic Panel:  Recent Labs  12/07/15 0508 12/08/15 0539 12/09/15 0511  NA 135 138 138  K 3.8 3.7 3.8  CL 106 107 107  CO2 22 23 23   GLUCOSE 98 101* 97  BUN 23* 18 18  CREATININE 0.69 0.70 0.66  CALCIUM 8.1* 8.1* 8.2*  MG 1.7 2.0  --    Liver Function Tests: No results for input(s): AST, ALT, ALKPHOS, BILITOT, PROT, ALBUMIN in the last 72 hours. No results for input(s): LIPASE, AMYLASE in the last 72 hours. CBC:  Recent Labs  12/08/15 0539 12/09/15 0511  WBC 8.9 8.5  HGB 12.3* 12.0*  HCT 36.4* 35.9*  MCV 93.6 94.2  PLT 415* 395   Cardiac Enzymes: No results for input(s): CKTOTAL, CKMB, CKMBINDEX, TROPONINI in the last 72 hours. BNP: Invalid input(s): POCBNP D-Dimer: No results for input(s): DDIMER in the last 72 hours. Hemoglobin A1C: No results for input(s): HGBA1C in the last 72 hours. Fasting Lipid Panel: No  results for input(s): CHOL, HDL, LDLCALC, TRIG, CHOLHDL, LDLDIRECT in the last 72 hours. Thyroid Function Tests: No results for input(s): TSH, T4TOTAL, T3FREE, THYROIDAB in the last 72 hours.  Invalid input(s): FREET3 Anemia Panel: No results for input(s): VITAMINB12, FOLATE, FERRITIN, TIBC, IRON, RETICCTPCT in the last 72 hours. Coag Panel:   Lab Results  Component Value Date   INR 1.59* 12/09/2015   INR 1.61* 12/08/2015   INR 1.40 12/07/2015    RADIOLOGY: Dg Chest Port 1 View  12/03/2015  CLINICAL DATA:  Nasal and chest congestion EXAM: PORTABLE CHEST 1 VIEW COMPARISON:  None. FINDINGS: There is mild opacity in the right base compared to the left. No other pulmonary opacities. No pneumothorax. No pulmonary nodules or masses. Mild cardiomegaly. The hila and mediastinum are normal. IMPRESSION: Right lower lobe infiltrate.  Recommend follow-up to resolution. Electronically Signed   By: Dorise Bullion III M.D   On: 12/03/2015 16:39      A/P:  65 yo male with PMH of HTN, severe MR, PVCs presented with PNA and found to have new afib with RVR  1. Newly diagnosed atrial fibrillation with RVR - CHA2DS2-Vasc score 2 (age, HTN), given severe MR, coumadin for anticoagulation  - success of  cardioversion low given moderately to severely dilated LA on echo 07/30/2015, EF normal.  He would need an antiarrhythmic.  Will discuss with Dr. Meda Coffee. - continue 360mg  Diltiazem CD, metoprolol tartrate increased to 100mg  BID yesterday and digoxin 0.125mg  daily added. Rate better controlled today. He had one episode with HRs up in 150s this AM. HR low 100s currently but due for AM meds.  - ASA discontinued and started on coumadin. INR 1.6. Will continue to load.  He is set up in our coumadin clinic this Friday. This may need to be postponed.  2. RLL PNA: per IM  3. Severe MR: followed by Dr. Roxy Manns, last seen in 07/2015, who recommended surgery, but he  wished to think about it. Afib is an indication that his mitral valve should be repaired. When he recovers from his PNA, he will need a diagnostic cardiac cath and then need to be seen back by Dr. Roxy Manns. May need MAZE and/or LAA clipped if he decides to go further with surgery.   We again spoke about MV repair to preserve LV function longterm.  4. HTN: controlled in the setting of rate control for afib.    Dispo: coumadin clinic appt set up for Friday 12/11/15 and office visit by Dr. Meda Coffee on 12/14/15    Jettie Booze, MD  12/09/2015  10:15 AM   Addendum: Discussed with Dr. Meda Coffee.  Will start oral Amio.  After he has had a load for 1-2 weeeks, could schedule TEE cardioversion.  Jettie Booze, MD

## 2015-12-10 LAB — BASIC METABOLIC PANEL
Anion gap: 7 (ref 5–15)
BUN: 16 mg/dL (ref 6–20)
CALCIUM: 8.4 mg/dL — AB (ref 8.9–10.3)
CHLORIDE: 106 mmol/L (ref 101–111)
CO2: 23 mmol/L (ref 22–32)
CREATININE: 0.76 mg/dL (ref 0.61–1.24)
GFR calc non Af Amer: >60 mL/min (ref >60)
GLUCOSE: 101 mg/dL — AB (ref 65–99)
Potassium: 4.1 mmol/L (ref 3.5–5.1)
Sodium: 136 mmol/L (ref 135–145)

## 2015-12-10 LAB — MAGNESIUM: Magnesium: 1.8 mg/dL (ref 1.7–2.4)

## 2015-12-10 LAB — CBC
HCT: 33.7 % — ABNORMAL LOW (ref 39.0–52.0)
HEMOGLOBIN: 11.6 g/dL — AB (ref 13.0–17.0)
MCH: 31.1 pg (ref 26.0–34.0)
MCHC: 34.4 g/dL (ref 30.0–36.0)
MCV: 90.3 fL (ref 78.0–100.0)
PLATELETS: 334 10*3/uL (ref 150–400)
RBC: 3.73 MIL/uL — ABNORMAL LOW (ref 4.22–5.81)
RDW: 12.9 % (ref 11.5–15.5)
WBC: 9.1 10*3/uL (ref 4.0–10.5)

## 2015-12-10 LAB — PROTIME-INR
INR: 2.02 — AB (ref 0.00–1.49)
PROTHROMBIN TIME: 22 s — AB (ref 11.6–15.2)

## 2015-12-10 LAB — HEPARIN LEVEL (UNFRACTIONATED): Heparin Unfractionated: 0.77 IU/mL — ABNORMAL HIGH (ref 0.30–0.70)

## 2015-12-10 MED ORDER — DILTIAZEM HCL ER COATED BEADS 360 MG PO CP24: 360.0000 mg | ORAL_CAPSULE | Freq: Every day | ORAL | Status: DC

## 2015-12-10 MED ORDER — HEPARIN (PORCINE) IN NACL 100-0.45 UNIT/ML-% IJ SOLN
1400.0000 [IU]/h | INTRAMUSCULAR | Status: DC
  Administered 2015-12-10: 1400 [IU]/h via INTRAVENOUS
  Filled 2015-12-10: qty 250

## 2015-12-10 MED ORDER — METOPROLOL TARTRATE 100 MG PO TABS: 100.0000 mg | ORAL_TABLET | Freq: Two times a day (BID) | ORAL | Status: DC

## 2015-12-10 MED ORDER — LEVOFLOXACIN 750 MG PO TABS
ORAL_TABLET | ORAL | Status: DC
Start: 2015-12-10 — End: 2016-01-12

## 2015-12-10 MED ORDER — WARFARIN SODIUM 5 MG PO TABS: 5.0000 mg | ORAL_TABLET | Freq: Every day | ORAL | Status: DC

## 2015-12-10 MED ORDER — DIGOXIN 125 MCG PO TABS: 0.1250 mg | ORAL_TABLET | Freq: Every day | ORAL | Status: DC

## 2015-12-10 MED ORDER — AMIODARONE HCL 200 MG PO TABS: 400.0000 mg | ORAL_TABLET | Freq: Every day | ORAL | Status: DC

## 2015-12-10 MED ORDER — AMIODARONE HCL 400 MG PO TABS: 400.0000 mg | ORAL_TABLET | Freq: Every day | ORAL | Status: DC

## 2015-12-10 MED ORDER — BOOST / RESOURCE BREEZE PO LIQD: 1.0000 | Freq: Two times a day (BID) | ORAL | Status: DC

## 2015-12-10 MED ORDER — WARFARIN SODIUM 5 MG PO TABS
5.0000 mg | ORAL_TABLET | Freq: Once | ORAL | Status: AC
  Administered 2015-12-10: 5 mg via ORAL
  Filled 2015-12-10: qty 1

## 2015-12-10 MED FILL — WARFARIN SODIUM 5 MG TABLET: 5 | 30 days supply | Qty: 30 | Fill #0

## 2015-12-10 MED FILL — DIGOXIN 125 MCG TABLET: 125 | 30 days supply | Qty: 30 | Fill #0

## 2015-12-10 MED FILL — METOPROLOL TARTRATE 100 MG: 100 | 30 days supply | Qty: 60 | Fill #0

## 2015-12-10 MED FILL — AMIODARONE HCL 200 MG TAB: 200 | 30 days supply | Qty: 60 | Fill #0

## 2015-12-10 MED FILL — levoFLOXacin 750 MG TABS: 750 | 2 days supply | Qty: 2 | Fill #0

## 2015-12-10 NOTE — Progress Notes (Signed)
SUBJECTIVE:  Feeling okay. Hot and cold> wants to take a shower and eager to get home.  OBJECTIVE:   Vitals:   Filed Vitals:   12/09/15 0937 12/09/15 1342 12/09/15 2140 12/10/15 0549  BP: 137/79 109/61 134/78 122/69  Pulse: 116 45 98 87  Temp:  97.1 F (36.2 C) 98.4 F (36.9 C) 98.1 F (36.7 C)  TempSrc:  Oral Oral Oral  Resp:  20 20 20   Height:      Weight:      SpO2: 98% 100% 99% 97%   I&O's:    Intake/Output Summary (Last 24 hours) at 12/10/15 [id number] Last data filed at 12/09/15 2300  Gross per 24 hour  Intake 2228.43 ml  Output    675 ml  Net 1553.43 ml   TELEMETRY: Reviewed telemetry pt in AFib with RVR: now HR 110.      PHYSICAL EXAM General: Well developed, well nourished, in no acute distress Head:   Normal cephalic and atramatic  Lungs:   Clear bilaterally to auscultation. Heart:  Tachycardic, irregularly irregular S1 S2  No JVD.   Abdomen: abdomen soft and non-tender Msk:  Back normal,  Normal strength and tone for age. Extremities:   No edema.   Neuro: Alert and oriented. Psych:  Normal affect, responds appropriately Skin: No rash   LABS: Basic Metabolic Panel:  Recent Labs  12/08/15 0539 12/09/15 0511 12/10/15 0506  NA 138 138 136  K 3.7 3.8 4.1  CL 107 107 106  CO2 23 23 23   GLUCOSE 101* 97 101*  BUN 18 18 16   CREATININE 0.70 0.66 0.76  CALCIUM 8.1* 8.2* 8.4*  MG 2.0  --  1.8   Liver Function Tests: No results for input(s): AST, ALT, ALKPHOS, BILITOT, PROT, ALBUMIN in the last 72 hours. No results for input(s): LIPASE, AMYLASE in the last 72 hours. CBC:  Recent Labs  12/09/15 0511 12/10/15 0506  WBC 8.5 9.1  HGB 12.0* 11.6*  HCT 35.9* 33.7*  MCV 94.2 90.3  PLT 395 334    Anemia Panel: No results for input(s): VITAMINB12, FOLATE, FERRITIN, TIBC, IRON, RETICCTPCT in the last 72 hours. Coag Panel:   Lab Results  Component Value Date   INR 2.02* 12/10/2015   INR 1.59* 12/09/2015   INR 1.61* 12/08/2015    RADIOLOGY: Dg  Chest Port 1 View  12/03/2015  CLINICAL DATA:  Nasal and chest congestion EXAM: PORTABLE CHEST 1 VIEW COMPARISON:  None. FINDINGS: There is mild opacity in the right base compared to the left. No other pulmonary opacities. No pneumothorax. No pulmonary nodules or masses. Mild cardiomegaly. The hila and mediastinum are normal. IMPRESSION: Right lower lobe infiltrate.  Recommend follow-up to resolution. Electronically Signed   By: Dorise Bullion III M.D   On: 12/03/2015 16:39     A/P:  65 yo male with PMH of HTN, severe MR, PVCs presented with PNA and found to have new afib with RVR.  1. Newly diagnosed atrial fibrillation with RVR: HRs have been difficult to control - CHA2DS2-Vasc score 2 (age, HTN), given severe MR, coumadin for anticoagulation  - success of cardioversion low with out antiarrythmic therapy given moderately to severely dilated LA on echo 07/30/2015. He was started on amio load yesterday 400mg  BID with plans to cardiovert him in 1-2 weeks.  - continue 360mg  Diltiazem CD, metoprolol tartrate increased to 100mg  BID and digoxin 0.125mg  daily . Rate currently in low 100s but review of tele show his rates have been quite  elevated up to 170. - ASA discontinued and started on coumadin with heparin bridge. INR 2.0. I have rearranged his coumadin clinic appointment for after his appointment with Dr. Orlean Patten next Monday.    2. RLL PNA: per IM  3. 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 Roxy Manns, last seen in 07/2015, who recommended surgery, but he wished to think about it. Afib is an indication that his mitral valve should be repaired. When he recovers from his PNA, he will need a diagnostic cardiac cath and then need to be seen back by Dr. Roxy Manns. May need MAZE and/or LAA clipped if he decides to go further with surgery. Dr Irish Lack stressed importance of MV repair to preserve LV function longterm.  4. HTN: controlled in  the setting of rate control for afib.   Dispo:  office visit by Dr. Meda Coffee on 12/14/15 followed by coumadin clinic appointment.     Eileen Stanford, PA-C  12/10/2015  9:22 AM  [id number]    I have examined the patient and reviewed assessment and plan and discussed with patient.  Agree with above as stated.  Heart rate much better controlled on amiodarone. Will decrease to 400 mg daily. We'll have to watch that his INR does not jump too high given that we started amiodarone. Would consult with the pharmacy before deciding on his optimal home dosing. He'll be checked in the office early next week.  D/C IV heparin. Okay to discharge from a cardiac standpoint.  Close f/u with Dr. Meda Coffee.  VARANASI,JAYADEEP S.

## 2015-12-10 NOTE — Care Management Note (Signed)
Case Management Note  Patient Details  Name: Brian Martinez MRN: [id number] Date of Birth: October 05, 1950  Subjective/Objective:                    Action/Plan:d/c home no needs or orders.   Expected Discharge Date:                  Expected Discharge Plan:  Home/Self Care  In-House Referral:     Discharge planning Services  CM Consult  Post Acute Care Choice:    Choice offered to:     DME Arranged:    DME Agency:     HH Arranged:    [place] Agency:     Status of Service:  Completed, signed off  Medicare Important Message Given:  Yes Date Medicare IM Given:    Medicare IM give by:    Date Additional Medicare IM Given:    Additional Medicare Important Message give by:     If discussed at [place] of Stay Meetings, dates discussed:    Additional Comments:  Dessa Phi, RN 12/10/2015, 3:22 PM

## 2015-12-10 NOTE — Progress Notes (Signed)
[place] for Heparin-->Coumadin Indication: atrial fibrillation  No Known Allergies  Patient Measurements: Height: 6\' 1"  (185.4 cm) Weight: 153 lb 3.5 oz (69.5 kg) IBW/kg (Calculated) : 79.9 HEPARIN DW (KG): 65.5   Vital Signs: Temp: 98.1 F (36.7 C) (03/23 0549) Temp Source: Oral (03/23 0549) BP: 122/69 mmHg (03/23 0549) Pulse Rate: 87 (03/23 0549)  Labs:  Recent Labs  12/08/15 0539 12/08/15 1414 12/09/15 0511 12/10/15 0506  HGB 12.3*  --  12.0* 11.6*  HCT 36.4*  --  35.9* 33.7*  PLT 415*  --  395 334  LABPROT 19.2*  --  19.0* 22.0*  INR 1.61*  --  1.59* 2.02*  HEPARINUNFRC 0.72* 0.62 0.66 0.77*  CREATININE 0.70  --  0.66 0.76    Estimated Creatinine Clearance: 89.3 mL/min (by C-G formula based on Cr of 0.76).   Medical History: Past Medical History  Diagnosis Date  . Heart murmur   . Severe mitral regurgitation 07/30/2015  . MVP (mitral valve prolapse) 06/10/2015  . Abnormal EKG 06/10/2015  . PVC (premature ventricular contraction) 06/10/2015    Medications:  Infusions:  . sodium chloride 75 mL/hr at 12/10/15 0058  . heparin      Assessment: Patient's a 29 M with hx severe MR who presented to the ED on 3/16 with new onset afib.  Not on oral anticoagulant PTA. He's currently on heparin and warfarin started on 3/18. Baseline INR 1.25.    3/22:  Heparin level is therapeutic at 0.66  INR is sub-therapeutic at 1.59 today after 4 doses of 5mg    CBC stable.  No bleeding documented  Drug-drug Interactions: Levaquin started on 3/21 3/23:  0506 HL=0.77 (goal 0.3-0.7) , no problems with infusion/bleeding per RN   Goal of Therapy:  INR 2-3 Heparin level 0.3-0.7 units/ml Monitor platelets by anticoagulation protocol: Yes    Plan:   Decrease heparin drip to 1400 units/hr  Recheck HL in 8 hours  Monitor for s/s bleeding  Dorrene German 12/10/2015 6:18 AM

## 2015-12-10 NOTE — Progress Notes (Addendum)
ANTICOAGULATION CONSULT NOTE  Pharmacy Consult for Heparin, Warfarin  Indication: atrial fibrillation  No Known Allergies  Patient Measurements: Height: 6\' 1"  (185.4 cm) Weight: 153 lb 3.5 oz (69.5 kg) IBW/kg (Calculated) : 79.9 HEPARIN DW (KG): 65.5   Vital Signs: Temp: 98.1 F (36.7 C) (03/23 0549) Temp Source: Oral (03/23 0549) BP: 122/69 mmHg (03/23 0549) Pulse Rate: 87 (03/23 0549)  Labs:  Recent Labs  12/08/15 0539 12/08/15 1414 12/09/15 0511 12/10/15 0506  HGB 12.3*  --  12.0* 11.6*  HCT 36.4*  --  35.9* 33.7*  PLT 415*  --  395 334  LABPROT 19.2*  --  19.0* 22.0*  INR 1.61*  --  1.59* 2.02*  HEPARINUNFRC 0.72* 0.62 0.66 0.77*  CREATININE 0.70  --  0.66 0.76    Estimated Creatinine Clearance: 89.3 mL/min (by C-G formula based on Cr of 0.76).   Medical History: Past Medical History  Diagnosis Date  . Heart murmur   . Severe mitral regurgitation 07/30/2015  . MVP (mitral valve prolapse) 06/10/2015  . Abnormal EKG 06/10/2015  . PVC (premature ventricular contraction) 06/10/2015    Medications:  Infusions:  . sodium chloride 75 mL/hr at 12/10/15 0058  . heparin 1,400 Units/hr (12/10/15 [id number])    Assessment: Patient's a 45 M with hx severe MR who presented to the ED on 3/16 with new onset afib.  Not on oral anticoagulant PTA. He's currently on heparin and warfarin started on 3/18. Baseline INR 1.25.    Today, 12/10/2015:   Heparin level 0.77, above goal on heparin at 1600 units/hr, reduced to 1400 units/hr  INR 2.02 is therapeutic (D6 heparin/warfarin overlap)   CBC:  Hgb decreasing slowly, but remains fairly stable.  Plt remain WNL.  No bleeding documented.  Drug-drug Interactions: Levaquin started on 3/21, Amio started 3/22  Goal of Therapy:  INR 2-3 Heparin level 0.3-0.7 units/ml Monitor platelets by anticoagulation protocol: Yes    Plan:  Warfarin 5 mg PO today at 1800 Continue heparin IV infusion at 1400 units/hr Heparin level in 6 hours   Daily INR, heparin level, and CBC Continue to monitor for s/s bleeding or thrombosis. Patient will need a minimum of 5 days Heparin/warfarin overlap until INR > 2 for 24 hours.   For discharge, recommend warfarin 4 mg daily (note interaction with both Levaquin and Amiodarone).  Noted plan for scheduled f/u visit at [facility] on Monday 3/27 per cardiology.  Gretta Arab PharmD, BCPS Pager [phone] 12/10/2015 8:37 AM

## 2015-12-10 NOTE — Discharge Summary (Signed)
Discharge Summary  Brian Martinez [id number] DOB: 08-08-1951  PCP: Dorothy Spark, MD  Admit date: 12/03/2015 Discharge date: 12/10/2015  Time spent: 25 minutes   Recommendations for Outpatient Follow-up:  1. New medication: Metoprolol 100 mg by mouth twice a day 2. New medication: Amiodarone 400 mg by mouth daily 3. New medication: Coumadin 5 mg by mouth daily at bedtime 4. New medication: Cardizem 360 mg by mouth daily 5. Follow-up: Patient has appointment with cardiology on Monday 3/27 6. New medication: Levaquin 750 mg by mouth daily on 3/24 and 3/25  7. Medication change: Aspirin discontinued since starting on Coumadin 8. New medication: Digoxin 0.125 by mouth daily  Discharge Diagnoses:  Active Hospital Problems   Diagnosis Date Noted  . Atrial fibrillation with RVR (Brian Martinez) 12/03/2015  . Chest discomfort   . Community acquired pneumonia   . Essential hypertension   . AKI (acute kidney injury) (Brian Martinez) 12/03/2015  . CAP (community acquired pneumonia) 12/03/2015  . Thrombocytosis (Brian Martinez) 12/03/2015  . Hypertensive heart disease 09/04/2015  . Hyperlipidemia 09/04/2015  . Severe mitral regurgitation 07/30/2015  . MVP (mitral valve prolapse) 06/10/2015    Resolved Hospital Problems   Diagnosis Date Noted Date Resolved  No resolved problems to display.    Discharge Condition: Improved, being discharged home   Diet recommendation: Heart healthy   Filed Vitals:   12/10/15 0945 12/10/15 0952  BP:  120/68  Pulse: 112 112  Temp:    Resp:      History of present illness:  Patient is a 65 year old male with past medical history of hypertension, severe mitral regurgitation secondary to prolapse who presented to the emergency room with new onset A. fib with rapid ventricular rate as well as community acquired pneumonia.  Hospital Course:  Principal Problem:   Atrial fibrillation with RVR [facility]): See my cardiology. Started on beta blockers plus digoxin plus Cardizem.  Heart rate difficult to control with valvular heart disease. Patient required amiodarone as well. Chads 2 score Only of 2, but given severe MR felt to need anticoagulation. Started on Coumadin and by 3/23 after 6 days, INR therapeutic. Given amiodarone and 2 days of Levaquin, recommendation is to go home on 5 mg daily at bedtime  Active Problems:   MVP (mitral valve prolapse)   Severe mitral regurgitation With partial flail posterior leaflet/ruptured chordae tendineae: Followed by cardiothoracic surgery last seen in November. That time they recommended surgery, but patient is still considering it.     Hyperli his atrial fibrillation is a strong indication for repair of mitral valve. We discussed this with the patient. After his pneumonia is fully treated, cardiology will need to do diagnostic cardiac cath and then follow-up with cardiothoracic surgery.    Hypertensive heart disease   AKI (acute kidney injury) (Brian Martinez)    Thrombocytosis (Brian Martinez)   Essential hypertension: With atrial fibrillation with difficult to control rate, medications adjusted as above.    Community acquired pneumonia: Initially started on Rocephin and Zithromax, but sputum culture grew out Streptococcus with intermediate sensitivity to Rocephin so changed to Levaquin. Patient has completed 3 days by discharge and will complete 2 more days of by mouth Levaquin   Procedures: None  Consultations:  Cardiology   Discharge Exam: BP 120/68 mmHg  Pulse 112  Temp(Src) 98.1 F (36.7 C) (Oral)  Resp 20  Ht 6\' 1"  (1.854 m)  Wt 69.5 kg (153 lb 3.5 oz)  BMI 20.22 kg/m2  SpO2 97%  General: Alert and oriented 3  Cardiovascular:  Irregular rhythm, borderline tachycardia  Respiratory: Clear to auscultation bilaterally   Discharge Instructions You were cared for by a hospitalist during your hospital stay. If you have any questions about your discharge medications or the care you received while you were in the hospital after you are  discharged, you can call the unit and asked to speak with the hospitalist on call if the hospitalist that took care of you is not available. Once you are discharged, your primary care physician will handle any further medical issues. Please note that NO REFILLS for any discharge medications will be authorized once you are discharged, as it is imperative that you return to your primary care physician (or establish a relationship with a primary care physician if you do not have one) for your aftercare needs so that they can reassess your need for medications and monitor your lab values.  Discharge Instructions    Diet - low sodium heart healthy    Complete by:  As directed      Increase activity slowly    Complete by:  As directed             Medication List    STOP taking these medications        aspirin EC 81 MG tablet     losartan 25 MG tablet  Commonly known as:  COZAAR      TAKE these medications        amiodarone 400 MG tablet  Commonly known as:  PACERONE  Take 1 tablet (400 mg total) by mouth daily.  Start taking on:  12/11/2015     DELSYM 30 MG/5ML liquid  Generic drug:  dextromethorphan  Take 30 mg by mouth as needed for cough.     digoxin 0.125 MG tablet  Commonly known as:  LANOXIN  Take 1 tablet (0.125 mg total) by mouth daily.     diltiazem 360 MG 24 hr capsule  Commonly known as:  CARDIZEM CD  Take 1 capsule (360 mg total) by mouth daily.     feeding supplement Liqd  Take 1 Container by mouth 2 (two) times daily between meals.     levofloxacin 750 MG tablet  Commonly known as:  LEVAQUIN  Take 1 pill on Friday 3/24 and 1 on Saturday 3/25     metoprolol 100 MG tablet  Commonly known as:  LOPRESSOR  Take 1 tablet (100 mg total) by mouth 2 (two) times daily.     vitamin B-12 100 MCG tablet  Commonly known as:  CYANOCOBALAMIN  Take 1 tablet (100 mcg total) by mouth daily.     Vitamin D3 5000 units Caps  Take 1 capsule (5,000 Units total) by mouth daily.       warfarin 5 MG tablet  Commonly known as:  COUMADIN  Take 1 tablet (5 mg total) by mouth daily at 6 PM.       No Known Allergies     Follow-up Information    Follow up with Dorothy Spark, MD On 12/14/2015.   Specialty:  Cardiology   Why:  2:30pm with a coumadin clinic appt at 3 pm    Contact information:   [place] [id number] [phone]        The results of significant diagnostics from this hospitalization (including imaging, microbiology, ancillary and laboratory) are listed below for reference.    Significant Diagnostic Studies: Dg Chest Port 1 View  12/03/2015  CLINICAL DATA:  Nasal and  chest congestion EXAM: PORTABLE CHEST 1 VIEW COMPARISON:  None. FINDINGS: There is mild opacity in the right base compared to the left. No other pulmonary opacities. No pneumothorax. No pulmonary nodules or masses. Mild cardiomegaly. The hila and mediastinum are normal. IMPRESSION: Right lower lobe infiltrate.  Recommend follow-up to resolution. Electronically Signed   By: Dorise Bullion III M.D   On: 12/03/2015 16:39    Microbiology: Recent Results (from the past 240 hour(s))  MRSA PCR Screening     Status: None   Collection Time: 12/04/15  1:49 AM  Result Value Ref Range Status   MRSA by PCR NEGATIVE NEGATIVE Final    Comment:        The GeneXpert MRSA Assay (FDA approved for NASAL specimens only), is one component of a comprehensive MRSA colonization surveillance program. It is not intended to diagnose MRSA infection nor to guide or monitor treatment for MRSA infections.   Culture, expectorated sputum-assessment     Status: None   Collection Time: 12/04/15 10:45 AM  Result Value Ref Range Status   Specimen Description SPUTUM  Final   Special Requests NONE  Final   Sputum evaluation   Final    THIS SPECIMEN IS ACCEPTABLE. RESPIRATORY CULTURE REPORT TO FOLLOW.   Report Status 12/04/2015 FINAL  Final  Culture, respiratory  (NON-Expectorated)     Status: None   Collection Time: 12/04/15 10:45 AM  Result Value Ref Range Status   Specimen Description SPUTUM  Final   Special Requests NONE  Final   Gram Stain   Final    ABUNDANT WBC PRESENT, PREDOMINANTLY PMN RARE SQUAMOUS EPITHELIAL CELLS PRESENT ABUNDANT GRAM POSITIVE COCCI IN PAIRS FEW GRAM POSITIVE RODS RARE YEAST WITH PSEUDOHYPHAE Performed at [facility]    Culture   Final    MODERATE STREPTOCOCCUS PNEUMONIAE Performed at [facility]    Report Status 12/08/2015 FINAL  Final   Organism ID, Bacteria STREPTOCOCCUS PNEUMONIAE  Final      Susceptibility   Streptococcus pneumoniae - MIC (ETEST)*    CEFTRIAXONE 1.5 INTERMEDIATE Intermediate     CLINDAMYCIN 256 RESISTANT Resistant     CLARITHROMYCIN 6 RESISTANT Resistant     CEFOTAXIME 2.0 INTERMEDIATE Intermediate     LEVOFLOXACIN 1.0 SENSITIVE Sensitive     PENICILLIN 4 RESISTANT Resistant     VANCOMYCIN 0.25 SENSITIVE Sensitive     * MODERATE STREPTOCOCCUS PNEUMONIAE     Labs: Basic Metabolic Panel:  Recent Labs Lab 12/04/15 0001  12/05/15 0356 12/06/15 0408 12/07/15 0508 12/08/15 0539 12/09/15 0511 12/10/15 0506  NA  --   < > 134* 137 135 138 138 136  K  --   < > 3.2* 3.7 3.8 3.7 3.8 4.1  CL  --   < > 103 105 106 107 107 106  CO2  --   < > 23 23 22 23 23 23   GLUCOSE  --   < > 114* 118* 98 101* 97 101*  BUN  --   < > 38* 30* 23* 18 18 16   CREATININE  --   < > 0.76 0.70 0.69 0.70 0.66 0.76  CALCIUM  --   < > 8.3* 8.3* 8.1* 8.1* 8.2* 8.4*  MG 2.2  --  1.8  --  1.7 2.0  --  1.8  < > = values in this interval not displayed. Liver Function Tests: No results for input(s): AST, ALT, ALKPHOS, BILITOT, PROT, ALBUMIN in the last 168 hours. No results for input(s): LIPASE,  AMYLASE in the last 168 hours. No results for input(s): AMMONIA in the last 168 hours. CBC:  Recent Labs Lab 12/04/15 0312  12/06/15 0408 12/07/15 0508 12/08/15 0539 12/09/15 0511 12/10/15 0506    WBC 14.0*  < > 13.3* 10.5 8.9 8.5 9.1  NEUTROABS 11.1*  --   --   --   --   --   --   HGB 11.8*  < > 12.6* 12.3* 12.3* 12.0* 11.6*  HCT 35.0*  < > 36.0* 36.6* 36.4* 35.9* 33.7*  MCV 90.2  < > 89.8 93.8 93.6 94.2 90.3  PLT 298  < > 419* 417* 415* 395 334  < > = values in this interval not displayed. Cardiac Enzymes: No results for input(s): CKTOTAL, CKMB, CKMBINDEX, TROPONINI in the last 168 hours. BNP: BNP (last 3 results) No results for input(s): BNP in the last 8760 hours.  ProBNP (last 3 results) No results for input(s): PROBNP in the last 8760 hours.  CBG:  Recent Labs Lab 12/03/15 1652  GLUCAP 125*       Signed:  Annita Brod  [facility] 12/10/2015, 1:54 PM

## 2015-12-10 NOTE — Discharge Instructions (Signed)
Atrial Fibrillation Atrial fibrillation is a type of heartbeat that is irregular or fast (rapid). If you have this condition, your heart keeps quivering in a weird (chaotic) way. This condition can make it so your heart cannot pump blood normally. Having this condition gives a person more risk for stroke, heart failure, and other heart problems. There are different types of atrial fibrillation. Talk with your doctor to learn about the type that you have. HOME CARE  Take over-the-counter and prescription medicines only as told by your doctor.  If your doctor prescribed a blood-thinning medicine, take it exactly as told. Taking too much of it can cause bleeding. If you do not take enough of it, you will not have the protection that you need against stroke and other problems.  Do not use any tobacco products. These include cigarettes, chewing tobacco, and e-cigarettes. If you need help quitting, ask your doctor.  If you have apnea (obstructive sleep apnea), manage it as told by your doctor.  Do not drink alcohol.  Do not drink beverages that have caffeine. These include coffee, soda, and tea.  Maintain a healthy weight. Do not use diet pills unless your doctor says they are safe for you. Diet pills may make heart problems worse.  Follow diet instructions as told by your doctor.  Exercise regularly as told by your doctor.  Keep all follow-up visits as told by your doctor. This is important. GET HELP IF:  You notice a change in the speed, rhythm, or strength of your heartbeat.  You are taking a blood-thinning medicine and you notice more bruising.  You get tired more easily when you move or exercise. GET HELP RIGHT AWAY IF:  You have pain in your chest or your belly (abdomen).  You have sweating or weakness.  You feel sick to your stomach (nauseous).  You notice blood in your throw up (vomit), poop (stool), or pee (urine).  You are short of breath.  You suddenly have swollen feet  and ankles.  You feel dizzy.  Your suddenly get weak or numb in your face, arms, or legs, especially if it happens on one side of your body.  You have trouble talking, trouble understanding, or both.  Your face or your eyelid droops on one side. These symptoms may be an emergency. Do not wait to see if the symptoms will go away. Get medical help right away. Call your local emergency services (911 in the [place] Do not drive yourself to the hospital.   This information is not intended to replace advice given to you by your health care provider. Make sure you discuss any questions you have with your health care provider.   Document Released: 06/14/2008 Document Revised: 05/27/2015 Document Reviewed: 12/31/2014 Elsevier Interactive Patient Education 2016 Else[employer] Inc[employer] Warfarin: What You Need to Know Warfarin is an anticoagulant. Anticoagulants help prevent the formation of blood clots. They also help stop the growth of blood clots. Warfarin is sometimes referred to as a "blood thinner."  Normally, when body tissues are cut or damaged, the blood clots in order to prevent blood loss. Sometimes clots form inside your blood vessels and obstruct the flow of blood through your circulatory system (thrombosis). These clots may travel through your bloodstream and become lodged in smaller blood vessels in your brain, which can cause a stroke, or in your lungs (pulmonary embolism). WHO SHOULD USE WARFARIN? Warfarin is prescribed for people at risk of developing harmful blood clots:  People with surgically implanted mechanical heart  valves, irregular heart rhythms called atrial fibrillation, and certain clotting disorders.  People who have developed harmful blood clotting in the past, including those who have had a stroke or a pulmonary embolism, or thrombosis in their legs (deep vein thrombosis [DVT]).  People with an existing blood clot, such as a pulmonary embolism. WARFARIN DOSING Warfarin tablets  come in different strengths. Each tablet strength is a different color, with the amount of warfarin (in milligrams) clearly printed on the tablet. If the color of your tablet is different than usual when you receive a new prescription, report it immediately to your pharmacist or health care provider. WARFARIN MONITORING The goal of warfarin therapy is to lessen the clotting tendency of blood but not prevent clotting completely. Your health care provider will monitor the anticoagulation effect of warfarin closely and adjust your dose as needed. For your safety, blood tests called prothrombin time (PT) or international normalized ratio (INR) are used to measure the effects of warfarin. Both of these tests can be done with a finger stick or a blood draw. The longer it takes the blood to clot, the higher the PT or INR. Your health care provider will inform you of your "target" PT or INR range. If, at any time, your PT or INR is above the target range, there is a risk of bleeding. If your PT or INR is below the target range, there is a risk of clotting. Whether you are started on warfarin while you are in the hospital or in your health care provider's office, you will need to have your PT or INR checked within one week of starting the medicine. Initially, some people are asked to have their PT or INR checked as much as twice a week. Once you are on a stable maintenance dose, the PT or INR is checked less often, usually once every 2 to 4 weeks. The warfarin dose may be adjusted if the PT or INR is not within the target range. It is important to keep all laboratory and health care provider follow-up appointments. Not keeping appointments could result in a chronic or permanent injury, pain, or disability because warfarin is a medicine that requires close monitoring. WHAT ARE THE SIDE EFFECTS OF WARFARIN?  Too much warfarin can cause bleeding (hemorrhage) from any part of the body. This may include bleeding from the  gums, blood in the urine, bloody or dark stools, a nosebleed that is not easily stopped, coughing up blood, or vomiting blood.  Too little warfarin can increase the risk of blood clots.  Too little or too much warfarin can also increase the risk of a stroke.  Warfarin use may cause a skin rash or irritation, an unusual fever, continual nausea or stomach upset, or severe pain in your joints or back. SPECIAL PRECAUTIONS WHILE TAKING WARFARIN Warfarin should be taken exactly as directed. It is very important to take warfarin as directed since bleeding or blood clots could result in chronic or permanent injury, pain, or disability.  Take your medicine at the same time every day. If you forget to take your dose, you can take it if it is within 6 hours of when it was due.  Do not change the dose of warfarin on your own to make up for missed or extra doses.  If you miss more than 2 doses in a row, you should contact your health care provider for advice. Avoid situations that cause bleeding. You may have a tendency to bleed more easily than  usual while taking warfarin. The following actions can limit bleeding:  Using a softer toothbrush.  Flossing with waxed floss rather than unwaxed floss.  Shaving with an Copy rather than a blade.  Limiting the use of sharp objects.  Avoiding potentially harmful activities, such as contact sports. Warfarin and Pregnancy or Breastfeeding  Warfarin is not advised during the first trimester of pregnancy due to an increased risk of birth defects. In certain situations, a woman may take warfarin after her first trimester of pregnancy. A woman who becomes pregnant or plans to become pregnant while taking warfarin should notify her health care provider immediately.  Although warfarin does not pass into breast milk, a woman who wishes to breastfeed while taking warfarin should also consult with her health care provider. Alcohol, Smoking, and Illicit Drug  Use  Alcohol affects how warfarin works in the body. It is best to avoid alcoholic drinks or consume very small amounts while taking warfarin. In general, alcohol intake should be limited to 1 oz (30 mL) of liquor, 6 oz (180 mL) of wine, or 12 oz (360 mL) of beer each day. Notify your health care provider if you change your alcohol intake.  Smoking affects how warfarin works. It is best to avoid smoking while taking warfarin. Notify your health care provider if you change your smoking habits.  It is best to avoid all illicit drugs while taking warfarin since there are few studies that show how warfarin interacts with these drugs. Other Medicines and Dietary Supplements Many prescription and over-the-counter medicines can interfere with warfarin. Be sure all of your health care providers know you are taking warfarin. Notify your health care provider who prescribed warfarin for you or your pharmacist before starting or stopping any new medicines, including over-the-counter vitamins, dietary supplements, and pain medicines. Your warfarin dose may need to be adjusted. Some common over-the-counter medicines that may increase the risk of bleeding while taking warfarin include:   Acetaminophen.  Aspirin.  Nonsteroidal anti-inflammatory medicines (NSAIDs), such as ibuprofen or naproxen.  Vitamin E. Dietary Considerations  Foods that have moderate or high amounts of vitamin K can interfere with warfarin. Avoid major changes in your diet or notify your health care provider before changing your diet. Eat a consistent amount of foods that have moderate or high amounts of vitamin K. Eating less foods containing vitamin K can increase the risk of bleeding. Eating more foods containing vitamin K can increase the risk of blood clots. Additional questions about dietary considerations can be discussed with a dietitian. Foods that are very high in vitamin K:  Greens, such as Swiss chard and beet, collard,  mustard, or turnip greens (fresh or frozen, cooked).  Kale (fresh or frozen, cooked).  Parsley (raw).  Spinach (cooked). Foods that are high in vitamin K:  Asparagus (frozen, cooked).  Broccoli.  Bok choy (cooked).  Brussels sprouts (fresh or frozen, cooked).  Cabbage (cooked).   Coleslaw. Foods that are moderately high in vitamin K:  Blueberries.  Black-eyed peas.  Endive (raw).  Green leaf lettuce (raw).  Green scallions (raw).  Kale (raw).  Okra (frozen, cooked).  Plantains (fried).  Romaine lettuce (raw).  Sauerkraut (canned).  Spinach (raw). CALL YOUR CLINIC OR HEALTH CARE PROVIDER IF YOU:  Plan to have any surgery or procedure.  Feel sick, especially if you have diarrhea or vomiting.  Experience or anticipate any major changes in your diet.  Start or stop a prescription or over-the-counter medicine.  Become, plan to become,  or think you may be pregnant.  Are having heavier than usual menstrual periods.  Have had a fall, accident, or any symptoms of bleeding or unusual bruising.  Develop an unusual fever. CALL 911 IN THE [place] OR GO TO THE EMERGENCY DEPARTMENT IF YOU:   Think you may be having an allergic reaction to warfarin. The signs of an allergic reaction could include itching, rash, hives, swelling, chest tightness, or trouble breathing.  See signs of blood in your urine. The signs could include reddish, pinkish, or tea-colored urine.  See signs of blood in your stools. The signs could include bright red or black stools.  Vomit or cough up blood. In these instances, the blood could have either a bright red or a "coffee-grounds" appearance.  Have bleeding that will not stop after applying pressure for 30 minutes such as cuts, nosebleeds, or other injuries.  Have severe pain in your joints or back.  Have a new and severe headache.  Have sudden weakness or numbness of your face, arm, or leg, especially on one side of your  body.  Have sudden confusion or trouble understanding.  Have sudden trouble seeing in one or both eyes.  Have sudden trouble walking, dizziness, loss of balance, or coordination.  Have trouble speaking or understanding (aphasia).   This information is not intended to replace advice given to you by your health care provider. Make sure you discuss any questions you have with your health care provider.   Document Released: 09/05/2005 Document Revised: 09/26/2014 Document Reviewed: 03/01/2013 Elsevier Interactive Patient Education [employer].

## 2015-12-14 ENCOUNTER — Ambulatory Visit: Payer: Medicare Other | Admitting: Cardiology

## 2015-12-19 DIAGNOSIS — N179 Acute kidney failure, unspecified: Secondary | ICD-10-CM

## 2015-12-19 DIAGNOSIS — E860 Dehydration: Secondary | ICD-10-CM

## 2015-12-19 HISTORY — DX: Dehydration: E86.0

## 2015-12-19 HISTORY — DX: Acute kidney failure, unspecified: N17.9

## 2016-01-07 ENCOUNTER — Encounter (HOSPITAL_COMMUNITY): Payer: Self-pay

## 2016-01-07 ENCOUNTER — Inpatient Hospital Stay (HOSPITAL_COMMUNITY)
Admission: EM | Admit: 2016-01-07 | Discharge: 2016-01-12 | DRG: 308 | Disposition: A | Discharge status: Home Health | Payer: Medicare Other | Attending: Family Medicine | Admitting: Family Medicine

## 2016-01-07 ENCOUNTER — Emergency Department (HOSPITAL_COMMUNITY): Payer: Medicare Other

## 2016-01-07 DIAGNOSIS — Z681 Body mass index (BMI) 19 or less, adult: Secondary | ICD-10-CM | POA: Diagnosis not present

## 2016-01-07 DIAGNOSIS — I10 Essential (primary) hypertension: Secondary | ICD-10-CM | POA: Diagnosis not present

## 2016-01-07 DIAGNOSIS — D696 Thrombocytopenia, unspecified: Secondary | ICD-10-CM | POA: Diagnosis present

## 2016-01-07 DIAGNOSIS — I341 Nonrheumatic mitral (valve) prolapse: Secondary | ICD-10-CM | POA: Diagnosis present

## 2016-01-07 DIAGNOSIS — R42 Dizziness and giddiness: Secondary | ICD-10-CM | POA: Diagnosis not present

## 2016-01-07 DIAGNOSIS — I340 Nonrheumatic mitral (valve) insufficiency: Secondary | ICD-10-CM | POA: Diagnosis not present

## 2016-01-07 DIAGNOSIS — I4891 Unspecified atrial fibrillation: Secondary | ICD-10-CM | POA: Diagnosis not present

## 2016-01-07 DIAGNOSIS — E876 Hypokalemia: Secondary | ICD-10-CM | POA: Diagnosis present

## 2016-01-07 DIAGNOSIS — F32A Depression, unspecified: Secondary | ICD-10-CM

## 2016-01-07 DIAGNOSIS — J449 Chronic obstructive pulmonary disease, unspecified: Secondary | ICD-10-CM | POA: Diagnosis not present

## 2016-01-07 DIAGNOSIS — E43 Unspecified severe protein-calorie malnutrition: Secondary | ICD-10-CM | POA: Diagnosis present

## 2016-01-07 DIAGNOSIS — F322 Major depressive disorder, single episode, severe without psychotic features: Secondary | ICD-10-CM | POA: Diagnosis not present

## 2016-01-07 DIAGNOSIS — Z91128 Patient's intentional underdosing of medication regimen for other reason: Secondary | ICD-10-CM

## 2016-01-07 DIAGNOSIS — R404 Transient alteration of awareness: Secondary | ICD-10-CM | POA: Diagnosis not present

## 2016-01-07 DIAGNOSIS — R531 Weakness: Secondary | ICD-10-CM | POA: Diagnosis not present

## 2016-01-07 DIAGNOSIS — F419 Anxiety disorder, unspecified: Secondary | ICD-10-CM | POA: Diagnosis present

## 2016-01-07 DIAGNOSIS — Z79899 Other long term (current) drug therapy: Secondary | ICD-10-CM | POA: Diagnosis not present

## 2016-01-07 DIAGNOSIS — E860 Dehydration: Secondary | ICD-10-CM | POA: Diagnosis present

## 2016-01-07 DIAGNOSIS — F329 Major depressive disorder, single episode, unspecified: Secondary | ICD-10-CM | POA: Diagnosis present

## 2016-01-07 DIAGNOSIS — Z8249 Family history of ischemic heart disease and other diseases of the circulatory system: Secondary | ICD-10-CM | POA: Diagnosis not present

## 2016-01-07 DIAGNOSIS — E861 Hypovolemia: Secondary | ICD-10-CM | POA: Diagnosis present

## 2016-01-07 DIAGNOSIS — M6281 Muscle weakness (generalized): Secondary | ICD-10-CM | POA: Diagnosis not present

## 2016-01-07 DIAGNOSIS — Z7901 Long term (current) use of anticoagulants: Secondary | ICD-10-CM | POA: Diagnosis not present

## 2016-01-07 HISTORY — DX: Major depressive disorder, single episode, unspecified: F32.9

## 2016-01-07 HISTORY — DX: Thrombocytopenia, unspecified: D69.6

## 2016-01-07 HISTORY — DX: Dehydration: E86.0

## 2016-01-07 HISTORY — DX: Depression, unspecified: F32.A

## 2016-01-07 HISTORY — DX: Acute kidney failure, unspecified: N17.9

## 2016-01-07 LAB — I-STAT VENOUS BLOOD GAS, ED
ACID-BASE EXCESS: 6 mmol/L — AB (ref 0.0–2.0)
Bicarbonate: 31.5 mEq/L — ABNORMAL HIGH (ref 20.0–24.0)
O2 Saturation: 37 %
PH VEN: 7.433 — AB (ref 7.250–7.300)
TCO2: 33 mmol/L (ref 0–100)
pCO2, Ven: 47.1 mmHg (ref 45.0–50.0)
pO2, Ven: 22 mmHg — ABNORMAL LOW (ref 31.0–45.0)

## 2016-01-07 LAB — CBC WITH DIFFERENTIAL/PLATELET
BASOS ABS: 0 10*3/uL (ref 0.0–0.1)
Basophils Relative: 0 %
Eosinophils Absolute: 0.1 10*3/uL (ref 0.0–0.7)
Eosinophils Relative: 1 %
HEMATOCRIT: 46 % (ref 39.0–52.0)
Hemoglobin: 15.3 g/dL (ref 13.0–17.0)
LYMPHS ABS: 1.3 10*3/uL (ref 0.7–4.0)
LYMPHS PCT: 16 %
MCH: 31.1 pg (ref 26.0–34.0)
MCHC: 33.3 g/dL (ref 30.0–36.0)
MCV: 93.5 fL (ref 78.0–100.0)
MONO ABS: 0.6 10*3/uL (ref 0.1–1.0)
Monocytes Relative: 7 %
NEUTROS ABS: 6.2 10*3/uL (ref 1.7–7.7)
Neutrophils Relative %: 77 %
PLATELETS: 119 10*3/uL — AB (ref 150–400)
RBC: 4.92 MIL/uL (ref 4.22–5.81)
RDW: 14.6 % (ref 11.5–15.5)
WBC: 8.1 10*3/uL (ref 4.0–10.5)

## 2016-01-07 LAB — CK: CK TOTAL: 30 U/L — AB (ref 49–397)

## 2016-01-07 LAB — COMPREHENSIVE METABOLIC PANEL
ALK PHOS: 57 U/L (ref 38–126)
ALT: 54 U/L (ref 17–63)
AST: 39 U/L (ref 15–41)
Albumin: 3.1 g/dL — ABNORMAL LOW (ref 3.5–5.0)
Anion gap: 11 (ref 5–15)
BILIRUBIN TOTAL: 1.7 mg/dL — AB (ref 0.3–1.2)
BUN: 31 mg/dL — ABNORMAL HIGH (ref 6–20)
CALCIUM: 8.8 mg/dL — AB (ref 8.9–10.3)
CO2: 27 mmol/L (ref 22–32)
CREATININE: 0.93 mg/dL (ref 0.61–1.24)
Chloride: 108 mmol/L (ref 101–111)
GFR calc non Af Amer: >60 mL/min (ref >60)
Glucose, Bld: 120 mg/dL — ABNORMAL HIGH (ref 65–99)
Potassium: 3.9 mmol/L (ref 3.5–5.1)
SODIUM: 146 mmol/L — AB (ref 135–145)
TOTAL PROTEIN: 6.1 g/dL — AB (ref 6.5–8.1)

## 2016-01-07 LAB — RAPID URINE DRUG SCREEN, HOSP PERFORMED
Amphetamines: NOT DETECTED
Barbiturates: NOT DETECTED
Benzodiazepines: NOT DETECTED
COCAINE: NOT DETECTED
OPIATES: NOT DETECTED
TETRAHYDROCANNABINOL: NOT DETECTED

## 2016-01-07 LAB — PROTIME-INR
INR: 1.22 (ref 0.00–1.49)
Prothrombin Time: 15.6 seconds — ABNORMAL HIGH (ref 11.6–15.2)

## 2016-01-07 LAB — URINE MICROSCOPIC-ADD ON

## 2016-01-07 LAB — URINALYSIS, ROUTINE W REFLEX MICROSCOPIC
Glucose, UA: NEGATIVE mg/dL
KETONES UR: 15 mg/dL — AB
NITRITE: NEGATIVE
PROTEIN: 100 mg/dL — AB
SPECIFIC GRAVITY, URINE: 1.026 (ref 1.005–1.030)
pH: 5.5 (ref 5.0–8.0)

## 2016-01-07 LAB — I-STAT CG4 LACTIC ACID, ED: Lactic Acid, Venous: 1.98 mmol/L (ref 0.5–2.0)

## 2016-01-07 LAB — I-STAT TROPONIN, ED: Troponin i, poc: 0 ng/mL (ref 0.00–0.08)

## 2016-01-07 MED ORDER — DILTIAZEM LOAD VIA INFUSION
10.0000 mg | Freq: Once | INTRAVENOUS | Status: AC
  Administered 2016-01-07: 10 mg via INTRAVENOUS
  Filled 2016-01-07: qty 10

## 2016-01-07 MED ORDER — DILTIAZEM HCL 100 MG IV SOLR
5.0000 mg/h | INTRAVENOUS | Status: DC
  Administered 2016-01-07: 5 mg/h via INTRAVENOUS
  Filled 2016-01-07: qty 100

## 2016-01-07 MED ORDER — SODIUM CHLORIDE 0.9 % IV BOLUS (SEPSIS)
500.0000 mL | Freq: Once | INTRAVENOUS | Status: AC
  Administered 2016-01-07: 500 mL via INTRAVENOUS

## 2016-01-07 MED ORDER — SODIUM CHLORIDE 0.9 % IV SOLN
Freq: Once | INTRAVENOUS | Status: AC
  Administered 2016-01-07: 21:00:00 via INTRAVENOUS

## 2016-01-07 NOTE — ED Provider Notes (Signed)
CSN: [id number]     Arrival date & time 01/07/16  1920 History   First MD Initiated Contact with Patient 01/07/16 1922     Chief Complaint  Patient presents with  . Weakness    HPI   Brian Martinez is an 65 y.o. male with history of afib (discharged 11/2015 with new afib on amiodarone, digoxin, dilt, warfarin), severe mitral valve prolapse who presents to the ED for evaluation of weakness and dehydration. He is accompanied by his sister-in-law who provides some of the history as well. Pt states that he has been feeling sad lately as his wife passed away ~1.5 years ago and "it was a hard week." His sister-in-law states that he has not been answering his phone for the past week so she and her husband drove up to check on him and found pt sitting in his armchair appearing very weak, though still alert and oriented. Pt was supposed to drive down to [facility] today but pt states he felt to weak to do so. He states he has not been eating or drinking for the past week. He states food does not taste good and his house is out of water. He states his water has been shut off because he felt too unwell to pay the water bill. He denies any pain but states he feels tired. He states that when he stands up he feels very weak and dizzy and has to sit down. Denies falls. He reports he thinks he has been taking all of his medications as prescribed (most of which are newly prescribed after last month's admission) but all of his prescriptions are in their original packaging and remain unopened. He denies chest pain though he does state his chest feels a little bit tight as if he cannot breathe deeply. He denies SI but becomes tearful and states "I don't like thinking that way" when asked.  Past Medical History  Diagnosis Date  . Heart murmur   . Severe mitral regurgitation 07/30/2015  . MVP (mitral valve prolapse) 06/10/2015  . Abnormal EKG 06/10/2015  . PVC (premature ventricular contraction) 06/10/2015   Past Surgical History   Procedure Laterality Date  . Inguinal hernia repair     Family History  Problem Relation Age of Onset  . Hypertension Father   . Cancer Brother   . Hypertension Brother   . Hypertension Mother   . Parkinsonism Mother   . Hypertension Brother   . Hypertension Brother    Social History  Substance Use Topics  . Smoking status: Never Smoker   . Smokeless tobacco: Not on file  . Alcohol Use: Yes    Review of Systems  All other systems reviewed and are negative.     Allergies  Review of patient's allergies indicates no known allergies.  Home Medications   Prior to Admission medications   Medication Sig Start Date End Date Taking? Authorizing Provider  amiodarone (PACERONE) 400 MG tablet Take 1 tablet (400 mg total) by mouth daily. 12/11/15   Annita Brod, MD  Cholecalciferol (VITAMIN D3) 5000 UNITS CAPS Take 1 capsule (5,000 Units total) by mouth daily. 06/11/15   Dorothy Spark, MD  dextromethorphan (DELSYM) 30 MG/5ML liquid Take 30 mg by mouth as needed for cough.    Historical Provider, MD  digoxin (LANOXIN) 0.125 MG tablet Take 1 tablet (0.125 mg total) by mouth daily. 12/10/15   Annita Brod, MD  diltiazem (CARDIZEM CD) 360 MG 24 hr capsule Take 1 capsule (360  mg total) by mouth daily. 12/10/15   Annita Brod, MD  feeding supplement (BOOST / RESOURCE BREEZE) LIQD Take 1 Container by mouth 2 (two) times daily between meals. 12/10/15   Annita Brod, MD  levofloxacin (LEVAQUIN) 750 MG tablet Take 1 pill on Friday 3/24 and 1 on Saturday 3/25 12/10/15   Annita Brod, MD  metoprolol (LOPRESSOR) 100 MG tablet Take 1 tablet (100 mg total) by mouth 2 (two) times daily. 12/10/15   Annita Brod, MD  vitamin B-12 (CYANOCOBALAMIN) 100 MCG tablet Take 1 tablet (100 mcg total) by mouth daily. 06/11/15   Dorothy Spark, MD  warfarin (COUMADIN) 5 MG tablet Take 1 tablet (5 mg total) by mouth daily at 6 PM. 12/10/15   Annita Brod, MD   There were no vitals  taken for this visit. Physical Exam  Constitutional: He is oriented to person, place, and time. No distress.  Emaciated, appears chronically unwell.  HENT:  Right Ear: External ear normal.  Left Ear: External ear normal.  Nose: Nose normal.  Mouth/Throat: Oropharynx is clear and moist. No oropharyngeal exudate.  Mm dry  Eyes: Conjunctivae and EOM are normal. Pupils are equal, round, and reactive to light.  Neck: Normal range of motion. Neck supple.  Cardiovascular: Intact distal pulses.  An irregular rhythm present. Tachycardia present.   Pulmonary/Chest: Effort normal. No respiratory distress. He has no wheezes. He has no rales.  Decreased lung sounds all fields  Abdominal: Soft. Bowel sounds are normal. He exhibits no distension. There is no tenderness.  Musculoskeletal: He exhibits no edema.  Neurological: He is alert and oriented to person, place, and time. No cranial nerve deficit.  No pronator drift Normal finger to nose  Skin: Skin is warm and dry. He is not diaphoretic.  Psychiatric: He exhibits a depressed mood. He expresses no homicidal and no suicidal ideation.  Nursing note and vitals reviewed.   ED Course  Procedures (including critical care time) Labs Review Labs Reviewed  COMPREHENSIVE METABOLIC PANEL - Abnormal; Notable for the following:    Sodium 146 (*)    Glucose, Bld 120 (*)    BUN 31 (*)    Calcium 8.8 (*)    Total Protein 6.1 (*)    Albumin 3.1 (*)    Total Bilirubin 1.7 (*)    All other components within normal limits  CBC WITH DIFFERENTIAL/PLATELET - Abnormal; Notable for the following:    Platelets 119 (*)    All other components within normal limits  CK - Abnormal; Notable for the following:    Total CK 30 (*)    All other components within normal limits  PROTIME-INR - Abnormal; Notable for the following:    Prothrombin Time 15.6 (*)    All other components within normal limits  I-STAT VENOUS BLOOD GAS, ED - Abnormal; Notable for the  following:    pH, Ven 7.433 (*)    pO2, Ven 22.0 (*)    Bicarbonate 31.5 (*)    Acid-Base Excess 6.0 (*)    All other components within normal limits  ETHANOL  ACETAMINOPHEN LEVEL  SALICYLATE LEVEL  URINALYSIS, ROUTINE W REFLEX MICROSCOPIC (NOT AT [facility])  URINE RAPID DRUG SCREEN, HOSP PERFORMED  I-STAT CG4 LACTIC ACID, ED  I-STAT TROPOININ, ED    Imaging Review Dg Chest Portable 1 View  01/07/2016  CLINICAL DATA:  Weakness, atrial fibrillation, dizziness with standing, abnormal behavior, hypertensive heart disease EXAM: PORTABLE CHEST 1 VIEW COMPARISON:  Portable exam  2028 hours compared to 12/03/2015 FINDINGS: Upper normal heart size. Atherosclerotic calcification aorta. Mediastinal contours and pulmonary vascularity normal. Emphysematous changes consistent with COPD. RIGHT apex scarring. No acute infiltrate, pleural effusion, or pneumothorax. Bones diffusely demineralized. IMPRESSION: COPD changes. No acute abnormalities. Resolution of RIGHT lower lobe infiltrate seen on previous exam. Electronically Signed   By: Lavonia Dana M.D.   On: 01/07/2016 20:37   I have personally reviewed and evaluated these images and lab results as part of my medical decision-making.   EKG Interpretation None      MDM   Final diagnoses:  Generalized weakness  Atrial fibrillation, unspecified type ([place])  Depression  Dehydration    Labs drawn. EKG shows afib with RVR. HR initially 110s-120s, now up to 130s-140s on the monitor and will fluctuate down to 120s. Will give 10mg  dilt loading dose with infusion, though some tachycardia 2/2 hypovolemia is to be expected.  HR still in 110s-120s. Will increased diltiazem. Labs are overall otherwise surprisingly reassuring. However, given pt's clinical appearance and diltiazem infusion will call hospitalist team for admission for rate control and re-anticoagulation. Once medically stabilized suspect pt will need psychiatric evaluation and mental health  assistance.  I spoke to Dr. Myna Hidalgo who will admit patient to tele.   Anne Ng, PA-C 01/07/16 2259  Veryl Speak, MD 01/07/16 [phone]

## 2016-01-07 NOTE — ED Notes (Signed)
Pt arrives EMS with c/o weakness and dizzy on standing. Pt found sitting in recliner after he was supposed to drive to [place]. Pt has had water turned off at house and drinking from water bottles. Not paying bills. Not responding to family calls.Medication picked up by pt one month ago but not opened.

## 2016-01-08 ENCOUNTER — Encounter (HOSPITAL_COMMUNITY): Payer: Self-pay | Admitting: Family Medicine

## 2016-01-08 DIAGNOSIS — I4891 Unspecified atrial fibrillation: Principal | ICD-10-CM

## 2016-01-08 DIAGNOSIS — F329 Major depressive disorder, single episode, unspecified: Secondary | ICD-10-CM | POA: Diagnosis present

## 2016-01-08 DIAGNOSIS — D696 Thrombocytopenia, unspecified: Secondary | ICD-10-CM

## 2016-01-08 DIAGNOSIS — F32A Depression, unspecified: Secondary | ICD-10-CM | POA: Diagnosis present

## 2016-01-08 LAB — TSH: TSH: 3.111 u[IU]/mL (ref 0.350–4.500)

## 2016-01-08 LAB — BASIC METABOLIC PANEL
ANION GAP: 10 (ref 5–15)
BUN: 29 mg/dL — AB (ref 6–20)
CO2: 27 mmol/L (ref 22–32)
Calcium: 8.5 mg/dL — ABNORMAL LOW (ref 8.9–10.3)
Chloride: 108 mmol/L (ref 101–111)
Creatinine, Ser: 0.96 mg/dL (ref 0.61–1.24)
Glucose, Bld: 143 mg/dL — ABNORMAL HIGH (ref 65–99)
POTASSIUM: 3.1 mmol/L — AB (ref 3.5–5.1)
SODIUM: 145 mmol/L (ref 135–145)

## 2016-01-08 LAB — PROTIME-INR
INR: 1.27 (ref 0.00–1.49)
Prothrombin Time: 16 seconds — ABNORMAL HIGH (ref 11.6–15.2)

## 2016-01-08 LAB — MAGNESIUM: MAGNESIUM: 2 mg/dL (ref 1.7–2.4)

## 2016-01-08 LAB — ACETAMINOPHEN LEVEL

## 2016-01-08 LAB — ETHANOL: Alcohol, Ethyl (B): <5 mg/dL (ref <5)

## 2016-01-08 LAB — T4, FREE: FREE T4: 1.1 ng/dL (ref 0.61–1.12)

## 2016-01-08 LAB — SALICYLATE LEVEL: Salicylate Lvl: <4 mg/dL (ref 2.8–30.0)

## 2016-01-08 MED ORDER — ONDANSETRON HCL 4 MG/2ML IJ SOLN: 4.0000 mg | Freq: Four times a day (QID) | INTRAMUSCULAR | Status: DC | PRN

## 2016-01-08 MED ORDER — POTASSIUM CHLORIDE 10 MEQ/100ML IV SOLN
10.0000 meq | Freq: Once | INTRAVENOUS | Status: AC
  Administered 2016-01-08: 10 meq via INTRAVENOUS
  Filled 2016-01-08: qty 100

## 2016-01-08 MED ORDER — BOOST / RESOURCE BREEZE PO LIQD
1.0000 | Freq: Two times a day (BID) | ORAL | Status: DC
  Administered 2016-01-08 – 2016-01-12 (×9): 1 via ORAL

## 2016-01-08 MED ORDER — POTASSIUM CHLORIDE CRYS ER 20 MEQ PO TBCR
40.0000 meq | EXTENDED_RELEASE_TABLET | ORAL | Status: AC
  Administered 2016-01-08 (×2): 40 meq via ORAL
  Filled 2016-01-08 (×2): qty 2

## 2016-01-08 MED ORDER — DILTIAZEM HCL 100 MG IV SOLR
5.0000 mg/h | INTRAVENOUS | Status: DC
  Administered 2016-01-08 (×2): 10 mg/h via INTRAVENOUS
  Filled 2016-01-08 (×2): qty 100

## 2016-01-08 MED ORDER — DILTIAZEM HCL 30 MG PO TABS
30.0000 mg | ORAL_TABLET | Freq: Four times a day (QID) | ORAL | Status: DC
  Filled 2016-01-08: qty 1

## 2016-01-08 MED ORDER — WARFARIN SODIUM 5 MG PO TABS
5.0000 mg | ORAL_TABLET | Freq: Once | ORAL | Status: AC
  Administered 2016-01-08: 5 mg via ORAL
  Filled 2016-01-08: qty 1

## 2016-01-08 MED ORDER — WARFARIN - PHARMACIST DOSING INPATIENT
Freq: Every day | Status: DC
  Administered 2016-01-09 – 2016-01-10 (×2)

## 2016-01-08 MED ORDER — DILTIAZEM HCL 100 MG IV SOLR
5.0000 mg/h | INTRAVENOUS | Status: DC
  Filled 2016-01-08: qty 100

## 2016-01-08 MED ORDER — WARFARIN SODIUM 5 MG PO TABS: 5.0000 mg | ORAL_TABLET | Freq: Every day | ORAL | Status: DC

## 2016-01-08 MED ORDER — DIGOXIN 125 MCG PO TABS
0.1250 mg | ORAL_TABLET | Freq: Every day | ORAL | Status: DC
  Administered 2016-01-08 – 2016-01-12 (×5): 0.125 mg via ORAL
  Filled 2016-01-08 (×5): qty 1

## 2016-01-08 MED ORDER — ENOXAPARIN SODIUM 80 MG/0.8ML ~~LOC~~ SOLN
1.0000 mg/kg | Freq: Two times a day (BID) | SUBCUTANEOUS | Status: DC
  Administered 2016-01-08 – 2016-01-12 (×9): 65 mg via SUBCUTANEOUS
  Filled 2016-01-08 (×10): qty 0.8

## 2016-01-08 MED ORDER — DILTIAZEM HCL 100 MG IV SOLR
INTRAVENOUS | Status: AC
  Administered 2016-01-08: 10 mg/h via INTRAVENOUS
  Filled 2016-01-08: qty 100

## 2016-01-08 MED ORDER — ACETAMINOPHEN 325 MG PO TABS: 650.0000 mg | ORAL_TABLET | ORAL | Status: DC | PRN

## 2016-01-08 NOTE — Progress Notes (Signed)
Patient lying in bed, no needs at this time other than wanting to sleep due to being in the ER all night. Call light within reach.

## 2016-01-08 NOTE — Clinical Social Work Note (Signed)
CSW received consult. CSW unable to see patient today. CSW will leave handoff for weekend CSW.   [employer], LCSW [phone]

## 2016-01-08 NOTE — Progress Notes (Signed)
Initial Nutrition Assessment  DOCUMENTATION CODES:   Severe malnutrition in context of chronic illness, Underweight  INTERVENTION:   Boost Breeze po TID, each supplement provides 250 kcal and 9 grams of protein  NUTRITION DIAGNOSIS:   Malnutrition related to  (depression) as evidenced by severe depletion of body fat, severe depletion of muscle mass  GOAL:   Patient will meet greater than or equal to 90% of their needs  MONITOR:   PO intake, Supplement acceptance, Labs, Weight trends, I & O's  REASON FOR ASSESSMENT:   Consult Assessment of nutrition requirement/status  ASSESSMENT:   65 yo Male who presented with complaints of rapid heart rate and weakness. The patient was recently admitted after being diagnosed with new onset A. fib. He was treated with various medications and anticoagulated and discharged to home. Since returning home, his family reports that he has not taken any of his medications and is doing nothing to take care of himself. His wife passed away within the past year and it appears that the patient has been very depressed. Family states that he is not paying his bills and recently had his water shut off. He is not opening his mail, not eating or drinking, and has been losing weight.  Patient reports he's been eating poorly x 1 week PTA. He ate "what I could" from his breakfast tray this AM. He reveals his weight has been a "swing thing" for him; does report he's lost about 25 lbs since his wife passed away (> 1 year ago). Drinks [employer] powder drink mix (with milk) at home which provides 220 kcals, 13 gm protein per serving.  He is amenable to Boost Breeze here during his hospital stay.  Nutrition-Focused physical exam completed. Findings are severe fat depletion, severe muscle depletion, and no edema.   Diet Order:  Diet regular Room service appropriate?: Yes; Fluid consistency:: Thin  Skin:  Reviewed, no issues  Last BM:   N/A  Height:   Ht Readings from Last 1 Encounters:  01/08/16 6\' 1"  (1.854 m)    Weight:   Wt Readings from Last 1 Encounters:  01/08/16 131 lb 1.6 oz (59.467 kg)    Ideal Body Weight:  83.6 kg  BMI:  Body mass index is 17.3 kg/(m^2).  Estimated Nutritional Needs:   Kcal:  1800-2000  Protein:  90-100 gm  Fluid:  1.8-2.0 L  EDUCATION NEEDS:   No education needs identified at this time  Arthur Holms, RD, LDN Pager #: [phone] After-Hours Pager #: [phone]

## 2016-01-08 NOTE — Progress Notes (Signed)
ANTICOAGULATION CONSULT NOTE - Initial Consult  Pharmacy Consult for Coumadin Indication: atrial fibrillation  No Known Allergies  Patient Measurements: Height: 6\' 1"  (185.4 cm) Weight: 147 lb (66.679 kg) IBW/kg (Calculated) : 79.9  Vital Signs: Temp: 98.2 F (36.8 C) (04/20 1943) BP: 121/84 mmHg (04/20 2330) Pulse Rate: 54 (04/20 2330)  Labs:  Recent Labs  01/07/16 2004  HGB 15.3  HCT 46.0  PLT 119*  LABPROT 15.6*  INR 1.22  CREATININE 0.93  CKTOTAL 30*    Estimated Creatinine Clearance: 73.7 mL/min (by C-G formula based on Cr of 0.93).   Medical History: Past Medical History  Diagnosis Date  . Heart murmur   . Severe mitral regurgitation 07/30/2015  . MVP (mitral valve prolapse) 06/10/2015  . Abnormal EKG 06/10/2015  . PVC (premature ventricular contraction) 06/10/2015    Medications:  See electronic med rec  Assessment: [name] presents with recurrent afib with RVR and medication noncompliance. Pt supposed to be on coumadin 5 mg daily but hasn't been taking this month at all even though he has filled prescription at home. Admit INR 1.22 - subtherapeutic. Hgb ok, plt down to 119 (were in 300s ~4 wks ago).  Goal of Therapy:  INR 2-3 Monitor platelets by anticoagulation protocol: Yes   Plan:  Daily INR Coumadin 5mg  po tonight  Sherlon Handing, PharmD, BCPS Clinical pharmacist, pager [phone] 01/08/2016,1:03 AM

## 2016-01-08 NOTE — Progress Notes (Addendum)
Subjective: Patient admitted this morning, see detailed h&P by Dr Myna Hidalgo 65 y.o. male with medical history significant for hypertension, severe mitral valve regurgitation, and recent diagnosis of atrial fibrillation on Coumadin who presents to the ED with generalized weakness and dizziness upon standing. The patient was admitted to [facility] approximately one month ago under similar circumstances and was diagnosed with new onset atrial fibrillation with RVR. Rate was difficult to control at that time and the patient was eventually discharged with Cardizem, Pacerone, and digoxin. He had been anticoagulated and discharged with Coumadin This morning he denies chest pain or shortness of breath. Filed Vitals:   01/08/16 1021 01/08/16 1243  BP: 93/66 102/74  Pulse: 91 87  Temp: 97.7 F (36.5 C) 97.6 F (36.4 C)  Resp: 18 18    Chest: Clear Bilaterally Heart : S1S2 irregular Abdomen: Soft, nontender Ext : No edema Neuro: Alert, oriented x 3  A/P Atrial fibrillation with RVR We'll start the patient on Cardizem infusion, continue Coumadin per pharmacy consolidation. Patient is on full dose Lovenox for bridging therapy.  H/o depression Patient has poor po intake at home. Will consult psychiatry.  Hypokalemia- replace potassium and check bmp in am.  [facility] Pager- [phone]

## 2016-01-08 NOTE — ED Notes (Signed)
Attempted report x1. 

## 2016-01-08 NOTE — Progress Notes (Signed)
Utilization review completed.  

## 2016-01-08 NOTE — Progress Notes (Signed)
Chaplain presented to the patient to provide spiritual care support.  Chaplain introduction was made and the patient was welcoming in his greeting of the Chaplain, appeared relived to be able to have a listening ear.  The ministry of presence was provided  For him as he shared some of his recent hospitalization history which resulted from several factors. He speaks of the death of his wife a year age after having been married for 37 years, and how very hard it has been for him without her.  He went throug grief with Hospice which ended in January of this year, and he also experienced several other major life events which he feels have thrown him into a depressive state of being.  He has no children, when asked about his support system, he  states his spiritual support is limited because he has not attended church regularly since his wife's death. His family and/or in-laws do not live locally, so he feels like he is by himself.  While speaking with the patient his in-laws from out of town came to visit much to his pleasure.  This Chaplain ended at this time, but will follow up with the patient at a later for continued spiritual care support. Chaplain Yaakov Guthrie [phone]

## 2016-01-08 NOTE — H&P (Signed)
History and Physical    BABAK LACK [id number] DOB: April 16, 1951 DOA: 01/07/2016  Referring Provider: EDP  PCP: Dorothy Spark, MD  Outpatient Specialists: Dr. Meda Coffee (cardiology), Dr. Roxy Manns (CTS)   Patient coming from: Home   Chief Complaint: Generalized weakness, dizziness upon standing   HPI: Brian Martinez is a 65 y.o. male with medical history significant for hypertension, severe mitral valve regurgitation, and recent diagnosis of atrial fibrillation on Coumadin who presents to the ED with generalized weakness and dizziness upon standing. The patient was admitted to [facility] approximately one month ago under similar circumstances and was diagnosed with new onset atrial fibrillation with RVR. Rate was difficult to control at that time and the patient was eventually discharged with Cardizem, Pacerone, and digoxin. He had been anticoagulated and discharged with Coumadin. Unfortunately, citing severe depression since the passing of his wife, the patient has not filled any of his new prescriptions, has not been returning phone calls from family, has not paid his bills and his water has been shut off due to this, and he reports very limited oral intake in the past month. His in-laws went to his house to check on him and he was encouraged to come into the emergency department for evaluation.   ED Course: Upon arrival to the ED, patient is found to be afebrile, saturating well on room air, tachycardic to the 120s, but with vitals otherwise stable. EKG features atrial fibrillation with rate of 124 and LVH by voltage criteria. Chest x-ray is consistent with COPD but negative for acute cardiopulmonary disease. Troponin is undetectable, INR is 1.2 to, and lactic acid is 1.98. CMP is notable for sodium of 146, BUN of 31, and serum creatinine of 0.93. CBC features a thrombocytopenia to 119,000. A 500 mL normal saline bolus was administered in the ED and the patient was given an IVP of  diltiazem. Diltiazem infusion was initiated and his heart rate improved to the low 100s. Patient will be admitted to the hospital for ongoing evaluation and management of atrial fibrillation with RVR and depression complicating his medical care.  Review of Systems:  All other systems reviewed and apart from HPI, are negative.  Past Medical History  Diagnosis Date  . Heart murmur   . Severe mitral regurgitation 07/30/2015  . MVP (mitral valve prolapse) 06/10/2015  . Abnormal EKG 06/10/2015  . PVC (premature ventricular contraction) 06/10/2015    Past Surgical History  Procedure Laterality Date  . Inguinal hernia repair       reports that he has never smoked. He does not have any smokeless tobacco history on file. He reports that he drinks alcohol. His drug history is not on file.  No Known Allergies  Family History  Problem Relation Age of Onset  . Hypertension Father   . Cancer Brother   . Hypertension Brother   . Hypertension Mother   . Parkinsonism Mother   . Hypertension Brother   . Hypertension Brother      Prior to Admission medications   Medication Sig Start Date End Date Taking? Authorizing Provider  amiodarone (PACERONE) 200 MG tablet Reported on 01/07/2016 12/10/15   Historical Provider, MD  amiodarone (PACERONE) 400 MG tablet Take 1 tablet (400 mg total) by mouth daily. Patient not taking: Reported on 01/07/2016 12/11/15   Annita Brod, MD  Cholecalciferol (VITAMIN D3) 5000 UNITS CAPS Take 1 capsule (5,000 Units total) by mouth daily. Patient not taking: Reported on 01/07/2016 06/11/15   Jamse Belfast  Meda Coffee, MD  digoxin (LANOXIN) 0.125 MG tablet Take 1 tablet (0.125 mg total) by mouth daily. Patient not taking: Reported on 01/07/2016 12/10/15   Annita Brod, MD  diltiazem (CARDIZEM CD) 360 MG 24 hr capsule Take 1 capsule (360 mg total) by mouth daily. Patient not taking: Reported on 01/07/2016 12/10/15   Annita Brod, MD  feeding supplement (BOOST / RESOURCE  BREEZE) LIQD Take 1 Container by mouth 2 (two) times daily between meals. Patient not taking: Reported on 01/07/2016 12/10/15   Annita Brod, MD  levofloxacin [facility]) 750 MG tablet Take 1 pill on Friday 3/24 and 1 on Saturday 3/25 12/10/15   Annita Brod, MD  metoprolol (LOPRESSOR) 100 MG tablet Take 1 tablet (100 mg total) by mouth 2 (two) times daily. Patient not taking: Reported on 01/07/2016 12/10/15   Annita Brod, MD  vitamin B-12 (CYANOCOBALAMIN) 100 MCG tablet Take 1 tablet (100 mcg total) by mouth daily. Patient not taking: Reported on 01/07/2016 06/11/15   Dorothy Spark, MD  warfarin (COUMADIN) 5 MG tablet Take 1 tablet (5 mg total) by mouth daily at 6 PM. Patient not taking: Reported on 01/07/2016 12/10/15   Annita Brod, MD    Physical Exam: Filed Vitals:   01/07/16 2230 01/07/16 2300 01/07/16 2315 01/07/16 2330  BP: 122/90 110/81 113/80 121/84  Pulse: 110 111 107 54  Temp:      Resp: 19 14 16 13   Height:      Weight:      SpO2: 96% 93% 97% 97%      Constitutional: NAD, calm, comfortable. Cachectic  Eyes: PERTLA, lids and conjunctivae normal ENMT: Mucous membranes are moist. Posterior pharynx clear of any exudate or lesions.   Neck: normal, supple, no masses, no thyromegaly Respiratory: clear to auscultation bilaterally, no wheezing, no crackles. Normal respiratory effort. No accessory muscle use.  Cardiovascular: Rate ~120 and irregular, grade IV holosystolic murmur at apex, no rubs / gallops. No extremity edema. 2+ pedal pulses.    Abdomen: No distension, no tenderness, no masses palpated.  Bowel sounds normal.  Musculoskeletal: no clubbing / cyanosis. No joint deformity upper and lower extremities.  Normal muscle tone.  Skin: no rashes, lesions, ulcers. No induration Neurologic: CN 2-12 grossly intact. Sensation intact, DTR normal. Strength 5/5 in all 4 limbs.  Psychiatric: Normal judgment and insight. Alert and oriented x 3. Normal mood.      Labs on Admission: I have personally reviewed following labs and imaging studies  CBC:  Recent Labs Lab 01/07/16 2004  WBC 8.1  NEUTROABS 6.2  HGB 15.3  HCT 46.0  MCV 93.5  PLT 123456*   Basic Metabolic Panel:  Recent Labs Lab 01/07/16 2004  NA 146*  K 3.9  CL 108  CO2 27  GLUCOSE 120*  BUN 31*  CREATININE 0.93  CALCIUM 8.8*   GFR: Estimated Creatinine Clearance: 73.7 mL/min (by C-G formula based on Cr of 0.93). Liver Function Tests:  Recent Labs Lab 01/07/16 2004  AST 39  ALT 54  ALKPHOS 57  BILITOT 1.7*  PROT 6.1*  ALBUMIN 3.1*   No results for input(s): LIPASE, AMYLASE in the last 168 hours. No results for input(s): AMMONIA in the last 168 hours. Coagulation Profile:  Recent Labs Lab 01/07/16 2004  INR 1.22   Cardiac Enzymes:  Recent Labs Lab 01/07/16 2004  CKTOTAL 30*   BNP (last 3 results) No results for input(s): PROBNP in the last 8760 hours. HbA1C: No results  for input(s): HGBA1C in the last 72 hours. CBG: No results for input(s): GLUCAP in the last 168 hours. Lipid Profile: No results for input(s): CHOL, HDL, LDLCALC, TRIG, CHOLHDL, LDLDIRECT in the last 72 hours. Thyroid Function Tests: No results for input(s): TSH, T4TOTAL, FREET4, T3FREE, THYROIDAB in the last 72 hours. Anemia Panel: No results for input(s): VITAMINB12, FOLATE, FERRITIN, TIBC, IRON, RETICCTPCT in the last 72 hours. Urine analysis:    Component Value Date/Time   COLORURINE ORANGE* 01/07/2016 2232   APPEARANCEUR TURBID* 01/07/2016 2232   LABSPEC 1.026 01/07/2016 2232   PHURINE 5.5 01/07/2016 2232   GLUCOSEU NEGATIVE 01/07/2016 2232   HGBUR SMALL* 01/07/2016 2232   BILIRUBINUR SMALL* 01/07/2016 2232   KETONESUR 15* 01/07/2016 2232   PROTEINUR 100* 01/07/2016 2232   NITRITE NEGATIVE 01/07/2016 2232   LEUKOCYTESUR SMALL* 01/07/2016 2232   Sepsis Labs: @LABRCNTIP (procalcitonin:4,lacticidven:4) )No results found for this or any previous visit (from the  past 240 hour(s)).   Radiological Exams on Admission: Dg Chest Portable 1 View  01/07/2016  CLINICAL DATA:  Weakness, atrial fibrillation, dizziness with standing, abnormal behavior, hypertensive heart disease EXAM: PORTABLE CHEST 1 VIEW COMPARISON:  Portable exam 2028 hours compared to 12/03/2015 FINDINGS: Upper normal heart size. Atherosclerotic calcification aorta. Mediastinal contours and pulmonary vascularity normal. Emphysematous changes consistent with COPD. RIGHT apex scarring. No acute infiltrate, pleural effusion, or pneumothorax. Bones diffusely demineralized. IMPRESSION: COPD changes. No acute abnormalities. Resolution of RIGHT lower lobe infiltrate seen on previous exam. Electronically Signed   By: Lavonia Dana M.D.   On: 01/07/2016 20:37    EKG: Independently reviewed. Atrial fibrillation with RVR (rate 124), LVH by voltage criteria   Assessment/Plan  1. Atrial fibrillation with RVR  - First identified last month and attributed to valvular disease  - Never started any of his new medications following recent discharge from hospital  - Rate 120s on arrival, did not respond to diltiazem bolus and infusion initially; digoxin was added  - CHADS-VASC 2 for age and HTN, but cardiology advised AC last admit d/t severe valvular dz  - Resume warfarin with Lovenox bridge - Monitor on telemetry with goal rate <110   2. Severe mitral regurgitation  - Had been undergoing workup with Dr. Roxy Manns outpt for MVR  - Follow-up with Dr. Roxy Manns upon discharge   3. Dehydration, protein-calorie malnutrition - Secondary to poor PO intake, likely d/t depression  - Dietary consultation requested  - Social work consulted for depression aspect   4. Thrombocytopenia  - Platelet count 119,000 on admission, previously wnl  - Uncertain etiology; will trend   - No sign of active blood loss   5. Depression  - Pt attributes to the passing of his wife to breast cancer after >40 yrs marriage  - He is  socially-isolated for the most part, occasionally visiting with sister-in-law and brother-in-law  - Social work consultation requested     DVT prophylaxis: Warfarin per pharmacy with Lovenox bridge   Code Status: Full code  Family Communication: None present   Disposition Plan: Admit to telemetry   Consults called: Social work, dietary    Admission status: Inpatient    Vianne Bulls MD [facility] Pager [phone]  If 7PM-7AM, please contact night-coverage [email or web address] Password [email or web address]  01/08/2016, 1:01 AM

## 2016-01-09 DIAGNOSIS — I340 Nonrheumatic mitral (valve) insufficiency: Secondary | ICD-10-CM

## 2016-01-09 DIAGNOSIS — E43 Unspecified severe protein-calorie malnutrition: Secondary | ICD-10-CM | POA: Insufficient documentation

## 2016-01-09 DIAGNOSIS — I10 Essential (primary) hypertension: Secondary | ICD-10-CM

## 2016-01-09 LAB — CBC
HEMATOCRIT: 36.6 % — AB (ref 39.0–52.0)
Hemoglobin: 12.3 g/dL — ABNORMAL LOW (ref 13.0–17.0)
MCH: 31.4 pg (ref 26.0–34.0)
MCHC: 33.6 g/dL (ref 30.0–36.0)
MCV: 93.4 fL (ref 78.0–100.0)
PLATELETS: 129 10*3/uL — AB (ref 150–400)
RBC: 3.92 MIL/uL — AB (ref 4.22–5.81)
RDW: 14.5 % (ref 11.5–15.5)
WBC: 6.5 10*3/uL (ref 4.0–10.5)

## 2016-01-09 LAB — BASIC METABOLIC PANEL
Anion gap: 9 (ref 5–15)
BUN: 26 mg/dL — AB (ref 6–20)
CHLORIDE: 111 mmol/L (ref 101–111)
CO2: 23 mmol/L (ref 22–32)
Calcium: 8.3 mg/dL — ABNORMAL LOW (ref 8.9–10.3)
Creatinine, Ser: 0.81 mg/dL (ref 0.61–1.24)
GFR calc Af Amer: >60 mL/min (ref >60)
GFR calc non Af Amer: >60 mL/min (ref >60)
GLUCOSE: 105 mg/dL — AB (ref 65–99)
POTASSIUM: 3.7 mmol/L (ref 3.5–5.1)
Sodium: 143 mmol/L (ref 135–145)

## 2016-01-09 LAB — PROTIME-INR
INR: 1.32 (ref 0.00–1.49)
Prothrombin Time: 16.5 seconds — ABNORMAL HIGH (ref 11.6–15.2)

## 2016-01-09 MED ORDER — ATENOLOL 25 MG PO TABS
50.0000 mg | ORAL_TABLET | Freq: Two times a day (BID) | ORAL | Status: DC
  Administered 2016-01-09 – 2016-01-10 (×4): 50 mg via ORAL
  Filled 2016-01-09 (×4): qty 2

## 2016-01-09 MED ORDER — WARFARIN SODIUM 7.5 MG PO TABS
7.5000 mg | ORAL_TABLET | Freq: Once | ORAL | Status: AC
  Administered 2016-01-09: 7.5 mg via ORAL
  Filled 2016-01-09: qty 1

## 2016-01-09 NOTE — Progress Notes (Signed)
ANTICOAGULATION CONSULT NOTE - Follow Up Consult  Pharmacy Consult for Coumadin Indication: atrial fibrillation  No Known Allergies  Patient Measurements: Height: 6\' 1"  (185.4 cm) Weight: 134 lb 11.2 oz (61.1 kg) IBW/kg (Calculated) : 79.9  Vital Signs: Temp: 97.9 F (36.6 C) (04/22 1318) Temp Source: Oral (04/22 1318) BP: 109/66 mmHg (04/22 1318) Pulse Rate: 83 (04/22 1318)  Labs:  Recent Labs  01/07/16 2004 01/08/16 0210 01/09/16 0259  HGB 15.3  --  12.3*  HCT 46.0  --  36.6*  PLT 119*  --  129*  LABPROT 15.6* 16.0* 16.5*  INR 1.22 1.27 1.32  CREATININE 0.93 0.96 0.81  CKTOTAL 30*  --   --     Estimated Creatinine Clearance: 77.5 mL/min (by C-G formula based on Cr of 0.81).  Assessment: [name] presents with recurrent afib with RVR and medication noncompliance. Pt supposed to be on coumadin 5 mg daily but hasn't been taking this month at all even though he has filled prescription at home. Admit INR 1.22 - subtherapeutic. Hgb ok, plt down to 119 (were in 300s ~4 wks ago).  PMH: severe MR, afib  Anticoagulation: Admit INR 1.22. Pt supposed to be on coumadin 5 mg daily but hasn't been taking this month at all even though he has filled prescription at home. Hgb 12.3 (comparative to March). Plts 129 low. INR 1.32 today. CHADS2VASC: 2 (2.2%) MD bridging with Lovenox 1mg /kg q12h  Goal of Therapy:  INR 2-3 Monitor platelets by anticoagulation protocol: Yes   Plan:  Daily INR Coumadin 7.5mg  po x1 Lovenox 65 mg SQ q12h (per MD) Watch plts    Brian Martinez S. Brian Martinez, PharmD, BCPS Clinical Staff Pharmacist Pager [phone]  [place], [place] 01/09/2016,2:03 PM

## 2016-01-09 NOTE — Progress Notes (Signed)
[facility] PROGRESS NOTE  DERYCK SWOPES [id number] DOB: 10-17-1950 DOA: 01/07/2016 PCP: Lennie Odor PA-C  Outpatient specialist:  Brief HPI:  65 y.o. male with medical history significant for hypertension, severe mitral valve regurgitation, and recent diagnosis of atrial fibrillation on Coumadin who presents to the ED with generalized weakness and dizziness upon standing. The patient was admitted to [facility] approximately one month ago under similar circumstances and was diagnosed with new onset atrial fibrillation with RVR. Rate was difficult to control at that time and the patient was eventually discharged with Cardizem, Pacerone, and digoxin. He had been anticoagulated and discharged with Coumadin  Principal Problem:   Atrial fibrillation with RVR ([place]) Active Problems:   Severe mitral regurgitation   Essential hypertension   Dehydration   Depression   Thrombocytopenia (HCC)   Protein-calorie malnutrition, severe   Assessment/Plan: 1. Atrial fibrillation with RVR- patient started on Cardizem drip. Currently at 10 mg per hour. Start patient on atenolol 50 mg by mouth twice a day. Once heart rate controlled, GERD. Will wean off Cardizem drip. Digoxin 0.15 mg by mouth daily 2. Depression- patient admits to being depressed since death of his life. Denies suicidal ideation. Will consult psychiatry for further evaluation and management. 3. Hypokalemia- potassium replaced. BMP today shows potassium of 2.7. 4. Severe mitral regurgitation-he has been undergoing workup with Dr. Roxy Manns as outpatient follow-up mitral valve repair. 5. Thrombocytopenia- no clear etiology at this time. Today platelet  count is 129   DVT prophylaxis: Full dose anticoagulation Code Status: Full code Family Communication: No family at bedside Disposition Plan: To be decided after psych and social work evaluation   Consultants:  None   Procedures:  None  Antibiotics:   None  Subjective: Patient seen and examined, denies chest pain or shortness of breath. Patient admits to being depressed after his wife's death. But denies any suicidal ideation. Patient says that he had by mouth intake over the past 2 weeks since he was started on new medications during his previous admission at [facility]. Those medications given nausea and poor appetite   .Objective: Filed Vitals:   01/08/16 1021 01/08/16 1243 01/08/16 2024 01/09/16 0534  BP: 93/66 102/74 114/75 108/71  Pulse: 91 87 61 80  Temp: 97.7 F (36.5 C) 97.6 F (36.4 C) 97.9 F (36.6 C) 97.4 F (36.3 C)  TempSrc: Oral Oral Oral Oral  Resp: 18 18 18 18   Height:      Weight:    61.1 kg (134 lb 11.2 oz)  SpO2: 100% 100% 98% 100%    Intake/Output Summary (Last 24 hours) at 01/09/16 1241 Last data filed at 01/09/16 0800  Gross per 24 hour  Intake    840 ml  Output      0 ml  Net    840 ml   Filed Weights   01/07/16 1943 01/08/16 0632 01/09/16 0534  Weight: 66.679 kg (147 lb) 59.467 kg (131 lb 1.6 oz) 61.1 kg (134 lb 11.2 oz)    Examination:  General exam: Appears calm and comfortable  Respiratory system: Clear to auscultation. Respiratory effort normal. Cardiovascular system: S1 & S2 heard, irregular, No JVD, grade 3/6 systolic murmur at the right labia, rubs, gallops or clicks. No pedal edema. Gastrointestinal system: Abdomen is nondistended, soft and nontender. No organomegaly or masses felt. Normal bowel sounds heard. Central nervous system: Alert and oriented. No focal neurological deficits. Extremities: Symmetric 5 x 5 power. Skin: No rashes, lesions or ulcers Psychiatry: Judgement and  insight appear normal. Mood & affect appropriate.    Data Reviewed: I have personally reviewed following labs and imaging studies Basic Metabolic Panel:  Recent Labs Lab 01/07/16 2004 01/08/16 0122 01/08/16 0210 01/09/16 0259  NA 146*  --  145 143  K 3.9  --  3.1* 3.7  CL 108  --  108 111   CO2 27  --  27 23  GLUCOSE 120*  --  143* 105*  BUN 31*  --  29* 26*  CREATININE 0.93  --  0.96 0.81  CALCIUM 8.8*  --  8.5* 8.3*  MG  --  2.0  --   --    Liver Function Tests:  Recent Labs Lab 01/07/16 2004  AST 39  ALT 54  ALKPHOS 57  BILITOT 1.7*  PROT 6.1*  ALBUMIN 3.1*   CBC:  Recent Labs Lab 01/07/16 2004 01/09/16 0259  WBC 8.1 6.5  NEUTROABS 6.2  --   HGB 15.3 12.3*  HCT 46.0 36.6*  MCV 93.5 93.4  PLT 119* 129*   Cardiac Enzymes:  Recent Labs Lab 01/07/16 2004  CKTOTAL 30*     Studies: Dg Chest Portable 1 View  01/07/2016  CLINICAL DATA:  Weakness, atrial fibrillation, dizziness with standing, abnormal behavior, hypertensive heart disease EXAM: PORTABLE CHEST 1 VIEW COMPARISON:  Portable exam 2028 hours compared to 12/03/2015 FINDINGS: Upper normal heart size. Atherosclerotic calcification aorta. Mediastinal contours and pulmonary vascularity normal. Emphysematous changes consistent with COPD. RIGHT apex scarring. No acute infiltrate, pleural effusion, or pneumothorax. Bones diffusely demineralized. IMPRESSION: COPD changes. No acute abnormalities. Resolution of RIGHT lower lobe infiltrate seen on previous exam. Electronically Signed   By: Lavonia Dana M.D.   On: 01/07/2016 20:37    Scheduled Meds: . atenolol  50 mg Oral BID  . digoxin  0.125 mg Oral Daily  . enoxaparin (LOVENOX) injection  1 mg/kg Subcutaneous Q12H  . feeding supplement  1 Container Oral BID BM  . Warfarin - Pharmacist Dosing Inpatient   Does not apply q1800   Continuous Infusions: . diltiazem (CARDIZEM) infusion 10 mg/hr (01/08/16 1756)       Time spent: 25 min    [place] [facility] Pager [phone]. If 7PM-7AM, please contact night-coverage at [email or web address], password [email or web address] 01/09/2016, 12:41 PM  LOS: 2 days

## 2016-01-09 NOTE — Discharge Instructions (Signed)
Information on my medicine - Coumadin®   (Warfarin) ° °This medication education was reviewed with me or my healthcare representative as part of my discharge preparation.  The pharmacist that spoke with me during my hospital stay was:  Kaytlan Behrman Stillinger, RPH ° °Why was Coumadin prescribed for you? °Coumadin was prescribed for you because you have a blood clot or a medical condition that can cause an increased risk of forming blood clots. Blood clots can cause serious health problems by blocking the flow of blood to the heart, lung, or brain. Coumadin can prevent harmful blood clots from forming. °As a reminder your indication for Coumadin is:   Stroke Prevention Because Of Atrial Fibrillation ° °What test will check on my response to Coumadin? °While on Coumadin (warfarin) you will need to have an INR test regularly to ensure that your dose is keeping you in the desired range. The INR (international normalized ratio) number is calculated from the result of the laboratory test called prothrombin time (PT). ° °If an INR APPOINTMENT HAS NOT ALREADY BEEN MADE FOR YOU please schedule an appointment to have this lab work done by your health care provider within 7 days. °Your INR goal is usually a number between:  2 to 3 or your provider may give you a more narrow range like 2-2.5.  Ask your health care provider during an office visit what your goal INR is. ° °What  do you need to  know  About  COUMADIN? °Take Coumadin (warfarin) exactly as prescribed by your healthcare provider about the same time each day.  DO NOT stop taking without talking to the doctor who prescribed the medication.  Stopping without other blood clot prevention medication to take the place of Coumadin may increase your risk of developing a new clot or stroke.  Get refills before you run out. ° °What do you do if you miss a dose? °If you miss a dose, take it as soon as you remember on the same day then continue your regularly scheduled  regimen the next day.  Do not take two doses of Coumadin at the same time. ° °Important Safety Information °A possible side effect of Coumadin (Warfarin) is an increased risk of bleeding. You should call your healthcare provider right away if you experience any of the following: °  Bleeding from an injury or your nose that does not stop. °  Unusual colored urine (red or dark brown) or unusual colored stools (red or black). °  Unusual bruising for unknown reasons. °  A serious fall or if you hit your head (even if there is no bleeding). ° °Some foods or medicines interact with Coumadin® (warfarin) and might alter your response to warfarin. To help avoid this: °  Eat a balanced diet, maintaining a consistent amount of Vitamin K. °  Notify your provider about major diet changes you plan to make. °  Avoid alcohol or limit your intake to 1 drink for women and 2 drinks for men per day. °(1 drink is 5 oz. wine, 12 oz. beer, or 1.5 oz. liquor.) ° °Make sure that ANY health care provider who prescribes medication for you knows that you are taking Coumadin (warfarin).  Also make sure the healthcare provider who is monitoring your Coumadin knows when you have started a new medication including herbals and non-prescription products. ° °Coumadin® (Warfarin)  Major Drug Interactions  °Increased Warfarin Effect Decreased Warfarin Effect  °Alcohol (large quantities) °Antibiotics (esp. Septra/Bactrim, Flagyl, Cipro) °Amiodarone (Cordarone) °Aspirin (  ASA) °Cimetidine (Tagamet) °Megestrol (Megace) °NSAIDs (ibuprofen, naproxen, etc.) °Piroxicam (Feldene) °Propafenone (Rythmol SR) °Propranolol (Inderal) °Isoniazid (INH) °Posaconazole (Noxafil) Barbiturates (Phenobarbital) °Carbamazepine (Tegretol) °Chlordiazepoxide (Librium) °Cholestyramine (Questran) °Griseofulvin °Oral Contraceptives °Rifampin °Sucralfate (Carafate) °Vitamin K  ° °Coumadin® (Warfarin) Major Herbal Interactions  °Increased Warfarin Effect Decreased Warfarin Effect    °Garlic °Ginseng °Ginkgo biloba Coenzyme Q10 °Green tea °St. John’s wort   ° °Coumadin® (Warfarin) FOOD Interactions  °Eat a consistent number of servings per week of foods HIGH in Vitamin K °(1 serving = ½ cup)  °Collards (cooked, or boiled & drained) °Kale (cooked, or boiled & drained) °Mustard greens (cooked, or boiled & drained) °Parsley *serving size only = ¼ cup °Spinach (cooked, or boiled & drained) °Swiss chard (cooked, or boiled & drained) °Turnip greens (cooked, or boiled & drained)  °Eat a consistent number of servings per week of foods MEDIUM-HIGH in Vitamin K °(1 serving = 1 cup)  °Asparagus (cooked, or boiled & drained) °Broccoli (cooked, boiled & drained, or raw & chopped) °Brussel sprouts (cooked, or boiled & drained) *serving size only = ½ cup °Lettuce, raw (green leaf, endive, romaine) °Spinach, raw °Turnip greens, raw & chopped  ° °These websites have more information on Coumadin (warfarin):  www.coumadin.com; °www.ahrq.gov/consumer/coumadin.htm; ° ° ° °

## 2016-01-09 NOTE — Progress Notes (Signed)
Pt HR 70-90s AF s/p administration of newly ordered atenalol.  Per Dr. Darrick Meigs, cardizem discontinued due to rate control.

## 2016-01-10 DIAGNOSIS — F322 Major depressive disorder, single episode, severe without psychotic features: Secondary | ICD-10-CM

## 2016-01-10 DIAGNOSIS — E860 Dehydration: Secondary | ICD-10-CM

## 2016-01-10 DIAGNOSIS — F329 Major depressive disorder, single episode, unspecified: Secondary | ICD-10-CM

## 2016-01-10 LAB — BASIC METABOLIC PANEL
Anion gap: 9 (ref 5–15)
BUN: 23 mg/dL — AB (ref 6–20)
CHLORIDE: 106 mmol/L (ref 101–111)
CO2: 26 mmol/L (ref 22–32)
Calcium: 8.2 mg/dL — ABNORMAL LOW (ref 8.9–10.3)
Creatinine, Ser: 0.78 mg/dL (ref 0.61–1.24)
GFR calc non Af Amer: >60 mL/min (ref >60)
Glucose, Bld: 91 mg/dL (ref 65–99)
POTASSIUM: 3.6 mmol/L (ref 3.5–5.1)
Sodium: 141 mmol/L (ref 135–145)

## 2016-01-10 LAB — PROTIME-INR
INR: 1.4 (ref 0.00–1.49)
Prothrombin Time: 17.2 seconds — ABNORMAL HIGH (ref 11.6–15.2)

## 2016-01-10 MED ORDER — WARFARIN SODIUM 7.5 MG PO TABS
7.5000 mg | ORAL_TABLET | Freq: Once | ORAL | Status: AC
  Administered 2016-01-10: 7.5 mg via ORAL
  Filled 2016-01-10: qty 1

## 2016-01-10 MED ORDER — MIRTAZAPINE 7.5 MG PO TABS
7.5000 mg | ORAL_TABLET | Freq: Every day | ORAL | Status: DC
  Administered 2016-01-10 – 2016-01-11 (×2): 7.5 mg via ORAL
  Filled 2016-01-10 (×4): qty 1

## 2016-01-10 NOTE — Care Management Note (Signed)
Case Management Note  Patient Details  Name: Brian Martinez MRN: [id number] Date of Birth: 28-Jul-1951  Subjective/Objective:      [name] admitted 01/07/2016 with Generalized Weakness and dizziness. Recently diagnosed with  Atrial Fibrillation with RVR. 12/03/2015 thru 12/10/2015 during admission at [facility]. Poor po intake which patient attributes to new medications he was prescribed during that hospitalization. Severe Protein Malnutrition. Reports depression which he attributes to the death of his wife of 24 yrs early in 2016. CM consulted by Tywan CSW for referral to [facility] as pt needs short term  assistance in his home to ensure compliance with medications.                Action/Plan: CM spoke with pt by phone to obtain his choice of [place] agencies for [facility] and he has chosen [facility]. CM spoke with Tiffany, the liasion and arranged for this service. Will continue to follow for additional needs should they arise.    Expected Discharge Date:                  Expected Discharge Plan:  Home/Self Care  In-House Referral:     Discharge planning Services  CM Consult  Post Acute Care Choice:    Choice offered to:     DME Arranged:    DME Agency:     HH Arranged:    HH Agency:     Status of Service:  In process, will continue to follow  Medicare Important Message Given:    Date Medicare IM Given:    Medicare IM give by:    Date Additional Medicare IM Given:    Additional Medicare Important Message give by:     If discussed at [place] of Stay Meetings, dates discussed:    Additional Comments:  Delrae Sawyers, RN 01/10/2016, 1:57 PM

## 2016-01-10 NOTE — Clinical Social Work Psych Assess (Signed)
Clinical Social Work Nature conservation officer  Clinical Social Worker:  Fabian November Date/Time:  01/10/2016, 5:18 PM Referred By:  Physician Date Referred:  01/08/16 Reason for Referral:  Psychosocial Assessment   Presenting Symptoms/Problems  Presenting Symptoms/Problems(in person's/family's own words):  Patient [facility] to [facility] due to deconditoined after weeks of not eating and not complying with medications. Patient states that he wife passed in December 2015 and 5 days later her mother passed and in July 2016 patient states he retired. Patient states that he had monthly grief counseling at [facility] until Jan 2017. Patient also states that he was very busy in 2016. Patient states that between Valentine's Day and his birthday (both Feb 2017) he began to have decreased mood and this is his second admission for lack of medication compliance, dehydration and malnutrition. Patient understanding of need for greater support. Patient denies SI/HI/AVH. Patient agreeable to follow outpatient mental health support. Resources given.    Abuse/Neglect/Trauma History  Abuse/Neglect/Trauma History:  Denies History Abuse/Neglect/Trauma History Comments (indicate dates):  denied   Psychiatric History  Psychiatric History:  Outpatient Treatment (Grief Counseling for the past year) Psychiatric Medication:  See MAR   Current Mental Health Hospitalizations/Previous Mental Health History:  No hx of hospitalization. Patient had grief counseling for 1 year at [facility].   Current Provider:  None for [facility] Place and Date:    Current Medications:  See MAR   Previous Inpatient Admission/Date/Reason:  Denied   Emotional Health/Current Symptoms  Suicide/Self Harm: None Reported Suicide Attempt in Past (date/description):  Denied  Other Harmful Behavior (ex. homicidal ideation) (describe):  Denied   Psychotic/Dissociative Symptoms  Psychotic/Dissociative  Symptoms: None Reported Other Psychotic/Dissociative Symptoms:  Denied   Attention/Behavioral Symptoms  Attention/Behavioral Symptoms: Within Normal Limits Other Attention/Behavioral Symptoms:  Denied   Cognitive Impairment  Cognitive Impairment:  Within Normal Limits Other Cognitive Impairment:  Denied   Mood and Adjustment  Mood and Adjustment:   Increased low mood since Feb 2017   Stress, Anxiety, Trauma, Any Recent Loss/Stressor  Stress, Anxiety, Trauma, Any Recent Loss/Stressor: Grief/Loss (recent or history) Anxiety (frequency):  Daily noted by patient due to grief loss and increasing personal medical concerns that are new for patient.   Phobia (specify):  Denied  Compulsive Behavior (specify):  Denied  Obsessive Behavior (specify):  Denied  Other Stress, Anxiety, Trauma, Any Recent Loss/Stressor:  Denied   Substance Abuse/Use  Substance Abuse/Use: None SBIRT Completed (please refer for detailed history): No Self-reported Substance Use (last use and frequency):   Urinary Drug Screen Completed: No Alcohol Level:     Environment/Housing/Living Arrangement  Environmental/Housing/Living Arrangement:  (Private residence, Alone) Who is in the Home:  Patient lives alone  Emergency Contact:  Family   Financial  Financial: Medicare   Patient's Strengths and Goals  Patient's Strengths and Goals (patient's own words):  Patient states that he believes that he would like to return to his own baseline. Patient states that he believes that much of his depressed mood has been connected to the fact that he has not been very busy or active with projects or activities to spend his time working on. Patient is interested in developing ideas and plan to become more active.    Clinical Social Worker's Interpretive Summary  Clinical Social Workers Interpretive Summary:  Patient denies SI/HI/AVH. Patient identifies that he has been feeling better since he started eating  here in the hospital. This CSW recommended outpatient counseling and therapy. CSW also  referred to [facility] for addition support with [facility] to help patient manage medication administration in the home.    Disposition  Disposition: Outpatient Referral Made/Needed, Psych Clinical Social Worker Signing Off (Patient provided with list of outpatient providers for ongoing counseling and Best [facility]. Patient also encouraged to utilize PCP for additional followup and referral. CSW referred to [facility] for [facility] for medication management in the home. )

## 2016-01-10 NOTE — Progress Notes (Signed)
[facility] PROGRESS NOTE  OGDEN MALINA [id number] DOB: [birth year] DOA: 01/07/2016 PCP: Lennie Odor PA-C  Outpatient specialist:  Brief HPI:  65 y.o. male with medical history significant for hypertension, severe mitral valve regurgitation, and recent diagnosis of atrial fibrillation on Coumadin who presents to the ED with generalized weakness and dizziness upon standing. The patient was admitted to [facility] approximately one month ago under similar circumstances and was diagnosed with new onset atrial fibrillation with RVR. Rate was difficult to control at that time and the patient was eventually discharged with Cardizem, Pacerone, and digoxin. He had been anticoagulated and discharged with Coumadin. Patient admits to being depressed after his wife's death. But denies any suicidal ideation. Patient says that he had by mouth intake over the past 2 weeks since he was started on new medications during his previous admission at [facility]. Those medications given nausea and poor appetite.   Principal Problem:   Atrial fibrillation with RVR (HCC) Active Problems:   Severe mitral regurgitation   Essential hypertension   Dehydration   Depression   Thrombocytopenia (HCC)   Protein-calorie malnutrition, severe   Assessment/Plan: 1. Atrial fibrillation with RVR- patient was started on Cardizem drip. Wilburn Mylar we started  patient on atenolol 50 mg by mouth twice a day, weaned off  Cardizem drip. Heart rate is well controlled and patient is off Cardizem drip. Digoxin 0.15 mg by mouth daily. Will continue with the same dose of atenolol and digoxin. Continue anticoagulation but Lovenox and warfarin per pharmacy consultation. 2. Depression- patient admits to being depressed since death of his life. Denies suicidal ideation. Psychiatry has been consulted , awaiting recommendations.  3. Hypokalemia- potassium replaced. BMP today shows potassium of 3.6. 4. Severe  mitral regurgitation-he has been undergoing workup with Dr. Roxy Manns as outpatient follow-up mitral valve repair. 5. Thrombocytopenia- no clear etiology at this time. Today platelet  count is 129   DVT prophylaxis: Full dose anticoagulation Code Status: Full code Family Communication: No family at bedside Disposition Plan: To be decided after psych and social work evaluation   Consultants:  None   Procedures:  None  Antibiotics:  None  Subjective: Patient seen and examined, denies chest pain or shortness of breath. Heart rate is better controlled with atenolol. .Objective: Filed Vitals:   01/09/16 0534 01/09/16 1318 01/09/16 2006 01/10/16 0458  BP: 108/71 109/66 93/65 101/70  Pulse: 80 83 84 86  Temp: 97.4 F (36.3 C) 97.9 F (36.6 C) 98.9 F (37.2 C) 98.2 F (36.8 C)  TempSrc: Oral Oral Oral Oral  Resp: 18 18 18 16   Height:      Weight: 61.1 kg (134 lb 11.2 oz)   62.6 kg (138 lb 0.1 oz)  SpO2: 100% 99% 100% 98%    Intake/Output Summary (Last 24 hours) at 01/10/16 1004 Last data filed at 01/10/16 0741  Gross per 24 hour  Intake    840 ml  Output      0 ml  Net    840 ml   Filed Weights   01/08/16 0632 01/09/16 0534 01/10/16 0458  Weight: 59.467 kg (131 lb 1.6 oz) 61.1 kg (134 lb 11.2 oz) 62.6 kg (138 lb 0.1 oz)    Examination:  General exam: Appears calm and comfortable  Respiratory system: Clear to auscultation. Respiratory effort normal. Cardiovascular system: S1 & S2 heard, irregular, No JVD, grade 3/6 systolic murmur at the right labia, rubs, gallops or clicks. No pedal edema. Gastrointestinal system: Abdomen is nondistended,  soft and nontender. No organomegaly or masses felt. Normal bowel sounds heard. Central nervous system: Alert and oriented. No focal neurological deficits. Extremities: Symmetric 5 x 5 power. Skin: No rashes, lesions or ulcers Psychiatry: Judgement and insight appear normal. Mood & affect appropriate.    Data Reviewed: I have  personally reviewed following labs and imaging studies Basic Metabolic Panel:  Recent Labs Lab 01/07/16 2004 01/08/16 0122 01/08/16 0210 01/09/16 0259 01/10/16 0458  NA 146*  --  145 143 141  K 3.9  --  3.1* 3.7 3.6  CL 108  --  108 111 106  CO2 27  --  27 23 26   GLUCOSE 120*  --  143* 105* 91  BUN 31*  --  29* 26* 23*  CREATININE 0.93  --  0.96 0.81 0.78  CALCIUM 8.8*  --  8.5* 8.3* 8.2*  MG  --  2.0  --   --   --    Liver Function Tests:  Recent Labs Lab 01/07/16 2004  AST 39  ALT 54  ALKPHOS 57  BILITOT 1.7*  PROT 6.1*  ALBUMIN 3.1*   CBC:  Recent Labs Lab 01/07/16 2004 01/09/16 0259  WBC 8.1 6.5  NEUTROABS 6.2  --   HGB 15.3 12.3*  HCT 46.0 36.6*  MCV 93.5 93.4  PLT 119* 129*   Cardiac Enzymes:  Recent Labs Lab 01/07/16 2004  CKTOTAL 30*     Studies: No results found.  Scheduled Meds: . atenolol  50 mg Oral BID  . digoxin  0.125 mg Oral Daily  . enoxaparin (LOVENOX) injection  1 mg/kg Subcutaneous Q12H  . feeding supplement  1 Container Oral BID BM  . Warfarin - Pharmacist Dosing Inpatient   Does not apply q1800   Continuous Infusions:       Time spent: 25 min    [facility] Pager [phone]. If 7PM-7AM, please contact night-coverage at [email or web address], password [facility] 01/10/2016, 10:04 AM  LOS: 3 days

## 2016-01-10 NOTE — Progress Notes (Signed)
ANTICOAGULATION CONSULT NOTE - Follow Up Consult  Pharmacy Consult for warfarin Indication: atrial fibrillation  No Known Allergies  Patient Measurements: Height: 6\' 1"  (185.4 cm) Weight: 138 lb 0.1 oz (62.6 kg) IBW/kg (Calculated) : 79.9  Vital Signs: Temp: 98.2 F (36.8 C) (04/23 0458) Temp Source: Oral (04/23 0458) BP: 101/70 mmHg (04/23 0458) Pulse Rate: 86 (04/23 0458)  Labs:  Recent Labs  01/07/16 2004 01/08/16 0210 01/09/16 0259 01/10/16 0458  HGB 15.3  --  12.3*  --   HCT 46.0  --  36.6*  --   PLT 119*  --  129*  --   LABPROT 15.6* 16.0* 16.5* 17.2*  INR 1.22 1.27 1.32 1.40  CREATININE 0.93 0.96 0.81 0.78  CKTOTAL 30*  --   --   --     Estimated Creatinine Clearance: 80.4 mL/min (by C-G formula based on Cr of 0.78).  Assessment: 65 yo man admitted 01/07/2016 for AFib w/ RVR with medication nonadherence to warfarin. INR 1.22 on admission. Pharmacy consulted to dose warfarin.  PMH severe MR, afib  INR 1.4 today, subtherapeutic however rising appropriately. Received boosted 7.5 mg dose last night. Last Hgb 12.3, plt 129. Day 3 bridge with enoxaparin. No noted bleeding. Will give additional boosted dose today and possibly resume PTA dose tomorrow based on tomorrow's INR.  PTA warfarin 5 mg po qday  Goal of Therapy:  INR 2-3 Monitor platelets by anticoagulation protocol: Yes   Plan:  Warfarin 7.5mg  po x1 Lovenox 65 mg SQ q12h per MD Daily INR Monitor CBC and s/sx bleeding   Heloise Ochoa, Pharm.D., BCPS PGY2 Cardiology Pharmacy Resident Pager: [phone] 01/10/2016,12:06 PM

## 2016-01-10 NOTE — Consult Note (Signed)
[facility] [facility] Consult   Reason for Consult:  depression Referring Physician:  Dr. Darrick Meigs Patient Identification: Brian Martinez MRN:  [id number] Principal Diagnosis: Atrial fibrillation with RVR [facility]) Diagnosis:   Patient Active Problem List   Diagnosis Date Noted  . Protein-calorie malnutrition, severe [E43] 01/09/2016  . Depression [F32.9] 01/08/2016  . Thrombocytopenia ([place]) [D69.6] 01/08/2016  . Dehydration [E86.0] 01/07/2016  . Chest discomfort [R07.89]   . Community acquired pneumonia [J18.9]   . Essential hypertension [I10]   . Atrial fibrillation with RVR ([place]) [I48.91] 12/03/2015  . AKI (acute kidney injury) ([place]) [N17.9] 12/03/2015  . CAP (community acquired pneumonia) [J18.9] 12/03/2015  . Thrombocytosis ([place]) [D47.3] 12/03/2015  . Hyperlipidemia [E78.5] 09/04/2015  . Hypertensive heart disease [I11.9] 09/04/2015  . Severe mitral regurgitation [I34.0] 07/30/2015  . PAC (premature atrial contraction) [I49.1] 06/10/2015  . PVC (premature ventricular contraction) [I49.3] 06/10/2015  . MVP (mitral valve prolapse) [I34.1] 06/10/2015  . Mitral regurgitation [I34.0] 06/10/2015  . Abnormal EKG [R94.31] 06/10/2015    Total Time spent with patient: 1 hour  Subjective:   Brian Martinez is a 65 y.o. male patient admitted with depression and generalized weakness.  HPI:  Brian Martinez is a 65 years old male admitted to [facility] with increased symptoms of anxiety, depression, isolation, social withdrawal, disturbed sleep and appetite. Patient also reportedly lost 10-15 pounds in the last 90 days. Patient reportedly lost his wife and mother-in-law December 2015 and participated in hospice care for counseling services and bereavement at [facility]. 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 Reportedly was able to keep himself busy with a several activities for the  last 1 year and now he has been suffering with depression and anxiety and also started having cardiac problems. Patient was also recently hospitalized for atrial fibrillation with RVR. Patient denies active/passive suicidal ideation, intention or plans. Patient has no evidence of psychotic symptoms. Patient denies auditory/visual hallucinations, delusions and paranoia. Patient is willing to receive medication for depression, anxiety, disturbed sleep and appetite.  Past Psychiatric History: Patient has no history of acute psychiatric hospitalization or outpatient medication management. Patient is his medication from the primary care physician for high blood pressure and recently diagnosed with atrial fibrillation with RVR.   Risk to Self: Is patient at risk for suicide?: No Risk to Others:   Prior Inpatient Therapy:   Prior Outpatient Therapy:    Past Medical History:  Past Medical History  Diagnosis Date  . Heart murmur   . Severe mitral regurgitation 07/30/2015  . MVP (mitral valve prolapse) 06/10/2015  . Abnormal EKG 06/10/2015  . PVC (premature ventricular contraction) 06/10/2015  . Thrombocytopenia ([place])   . Depression   . Dehydration 12/2015  . AKI (acute kidney injury) ([place]) 12/2015    Past Surgical History  Procedure Laterality Date  . Inguinal hernia repair     Family History:  Family History  Problem Relation Age of Onset  . Hypertension Father   . Cancer Brother   . Hypertension Brother   . Hypertension Mother   . Parkinsonism Mother   . Hypertension Brother   . Hypertension Brother    Family Psychiatric  History: unknown Social History:  History  Alcohol Use  . Yes    Comment: occasional     History  Drug Use No    Social History   Social History  . Marital  Status: Widowed    Spouse Name: N/A  . Number of Children: N/A  . Years of Education: N/A   Social History Main Topics  . Smoking status: Never Smoker   . Smokeless tobacco: Never Used  . Alcohol  Use: Yes     Comment: occasional  . Drug Use: No  . Sexual Activity: Not Asked   Other Topics Concern  . None   Social History Narrative   Additional Social History:    Allergies:  No Known Allergies  Labs:  Results for orders placed or performed during the hospital encounter of 01/07/16 (from the past 48 hour(s))  Protime-INR     Status: Abnormal   Collection Time: 01/09/16  2:59 AM  Result Value Ref Range   Prothrombin Time 16.5 (H) 11.6 - 15.2 seconds   INR 1.32 0.00 - 1.49  CBC     Status: Abnormal   Collection Time: 01/09/16  2:59 AM  Result Value Ref Range   WBC 6.5 4.0 - 10.5 K/uL   RBC 3.92 (L) 4.22 - 5.81 MIL/uL   Hemoglobin 12.3 (L) 13.0 - 17.0 g/dL   HCT 36.6 (L) 39.0 - 52.0 %   MCV 93.4 78.0 - 100.0 fL   MCH 31.4 26.0 - 34.0 pg   MCHC 33.6 30.0 - 36.0 g/dL   RDW 14.5 11.5 - 15.5 %   Platelets 129 (L) 150 - 400 K/uL  Basic metabolic panel     Status: Abnormal   Collection Time: 01/09/16  2:59 AM  Result Value Ref Range   Sodium 143 135 - 145 mmol/L   Potassium 3.7 3.5 - 5.1 mmol/L   Chloride 111 101 - 111 mmol/L   CO2 23 22 - 32 mmol/L   Glucose, Bld 105 (H) 65 - 99 mg/dL   BUN 26 (H) 6 - 20 mg/dL   Creatinine, Ser 0.81 0.61 - 1.24 mg/dL   Calcium 8.3 (L) 8.9 - 10.3 mg/dL   GFR calc non Af Amer >60 >60 mL/min   GFR calc Af Amer >60 >60 mL/min    Comment: (NOTE) The eGFR has been calculated using the CKD EPI equation. This calculation has not been validated in all clinical situations. eGFR's persistently <60 mL/min signify possible Chronic Kidney Disease.    Anion gap 9 5 - 15  Protime-INR     Status: Abnormal   Collection Time: 01/10/16  4:58 AM  Result Value Ref Range   Prothrombin Time 17.2 (H) 11.6 - 15.2 seconds   INR 1.40 0.00 - 3.29  Basic metabolic panel     Status: Abnormal   Collection Time: 01/10/16  4:58 AM  Result Value Ref Range   Sodium 141 135 - 145 mmol/L   Potassium 3.6 3.5 - 5.1 mmol/L   Chloride 106 101 - 111 mmol/L   CO2  26 22 - 32 mmol/L   Glucose, Bld 91 65 - 99 mg/dL   BUN 23 (H) 6 - 20 mg/dL   Creatinine, Ser 0.78 0.61 - 1.24 mg/dL   Calcium 8.2 (L) 8.9 - 10.3 mg/dL   GFR calc non Af Amer >60 >60 mL/min   GFR calc Af Amer >60 >60 mL/min    Comment: (NOTE) The eGFR has been calculated using the CKD EPI equation. This calculation has not been validated in all clinical situations. eGFR's persistently <60 mL/min signify possible Chronic Kidney Disease.    Anion gap 9 5 - 15    Current Facility-Administered Medications  Medication Dose Route Frequency  Provider Last Rate Last Dose  . acetaminophen (TYLENOL) tablet 650 mg  650 mg Oral Q4H PRN Vianne Bulls, MD      . atenolol (TENORMIN) tablet 50 mg  50 mg Oral BID Oswald Hillock, MD   50 mg at 01/10/16 0745  . digoxin (LANOXIN) tablet 0.125 mg  0.125 mg Oral Daily Vianne Bulls, MD   0.125 mg at 01/10/16 0746  . enoxaparin (LOVENOX) injection 65 mg  1 mg/kg Subcutaneous Q12H Vianne Bulls, MD   65 mg at 01/10/16 0746  . feeding supplement (BOOST / RESOURCE BREEZE) liquid 1 Container  1 Container Oral BID BM Vianne Bulls, MD   1 Container at 01/09/16 1718  . ondansetron (ZOFRAN) injection 4 mg  4 mg Intravenous Q6H PRN Vianne Bulls, MD      . Warfarin - Pharmacist Dosing Inpatient   Does not apply q1800 Franky Macho, [facility]        Musculoskeletal: Strength & Muscle Tone: within normal limits Gait & Station: normal Patient leans: N/A  Psychiatric Specialty Exam: ROS complaining about depression, bereavement, disturbed sleep and appetite and weight loss No Fever-chills, No Headache, No changes with Vision or hearing, reports vertigo No problems swallowing food or Liquids, No Chest pain, Cough or Shortness of Breath, No Abdominal pain, No Nausea or Vommitting, Bowel movements are regular, No Blood in stool or Urine, No dysuria, No new skin rashes or bruises, No new joints pains-aches,  No new weakness, tingling, numbness in any extremity, No  recent weight gain or loss, No polyuria, polydypsia or polyphagia,   A full 10 point Review of Systems was done, except as stated above, all other Review of Systems were negative.  Blood pressure 101/70, pulse 86, temperature 98.2 F (36.8 C), temperature source Oral, resp. rate 16, height _0  (1.854 m), weight 62.6 kg (138 lb 0.1 oz), SpO2 98 %.Body mass index is 18.21 kg/(m^2).  General Appearance: Guarded  Eye Contact::  Good  Speech:  Clear and Coherent  Volume:  Normal  Mood:  Anxious and Depressed  Affect:  Congruent and Depressed  Thought Process:  Coherent and Goal Directed  Orientation:  Full (Time, Place, and Person)  Thought Content:  WDL  Suicidal Thoughts:  No  Homicidal Thoughts:  No  Memory:  Immediate;   Good Recent;   Good Remote;   Fair  Judgement:  Intact  Insight:  Fair  Psychomotor Activity:  Decreased  Concentration:  Good  Recall:  Good  Fund of Knowledge:Good  Language: Good  Akathisia:  Negative  Handed:  Right  AIMS (if indicated):     Assets:  Communication Skills Desire for Improvement Financial Resources/Insurance Housing Leisure Time Resilience Social Support Transportation  ADL's:  Intact  Cognition: WNL  Sleep:      Treatment Plan Summary: Daily contact with patient to assess and evaluate symptoms and progress in treatment and Medication management  Patient has no safety concerns We start Remeron 7.5 mg daily at bedtime for depression, poor appetite and insomnia Appreciate psychiatric consultation and follow up as clinically required Please contact [phone] or [phone] if needs further assistance  Disposition: Patient does not meet criteria for psychiatric inpatient admission. Supportive therapy provided about ongoing stressors.  Durward Parcel., MD 01/10/2016 11:11 AM

## 2016-01-11 LAB — GLUCOSE, CAPILLARY: Glucose-Capillary: 87 mg/dL (ref 65–99)

## 2016-01-11 LAB — PROTIME-INR
INR: 1.66 — AB (ref 0.00–1.49)
PROTHROMBIN TIME: 19.6 s — AB (ref 11.6–15.2)

## 2016-01-11 LAB — MAGNESIUM: MAGNESIUM: 1.8 mg/dL (ref 1.7–2.4)

## 2016-01-11 MED ORDER — ATENOLOL 25 MG PO TABS
50.0000 mg | ORAL_TABLET | Freq: Two times a day (BID) | ORAL | Status: DC
  Administered 2016-01-12 (×2): 50 mg via ORAL
  Filled 2016-01-11 (×3): qty 2

## 2016-01-11 MED ORDER — WARFARIN SODIUM 7.5 MG PO TABS
7.5000 mg | ORAL_TABLET | Freq: Once | ORAL | Status: AC
  Administered 2016-01-11: 7.5 mg via ORAL
  Filled 2016-01-11: qty 1

## 2016-01-11 MED ORDER — ATENOLOL 25 MG PO TABS
37.5000 mg | ORAL_TABLET | Freq: Two times a day (BID) | ORAL | Status: DC
  Administered 2016-01-11: 37.5 mg via ORAL
  Filled 2016-01-11: qty 2

## 2016-01-11 NOTE — Progress Notes (Signed)
ANTICOAGULATION CONSULT NOTE - Follow Up Consult  Pharmacy Consult for warfarin Indication: atrial fibrillation  No Known Allergies  Patient Measurements: Height: 6\' 1"  (185.4 cm) Weight: 139 lb 5.3 oz (63.2 kg) IBW/kg (Calculated) : 79.9  Vital Signs: Temp: 97.6 F (36.4 C) (04/24 0405) Temp Source: Oral (04/24 0405) BP: 98/69 mmHg (04/24 0405) Pulse Rate: 78 (04/24 0405)  Labs:  Recent Labs  01/09/16 0259 01/10/16 0458 01/11/16 0340  HGB 12.3*  --   --   HCT 36.6*  --   --   PLT 129*  --   --   LABPROT 16.5* 17.2* 19.6*  INR 1.32 1.40 1.66*  CREATININE 0.81 0.78  --     Estimated Creatinine Clearance: 81.2 mL/min (by C-G formula based on Cr of 0.78).  Assessment: 65 yo man admitted 01/07/2016 for AFib w/ RVR with medication nonadherence to warfarin. INR 1.22 on admission. Pharmacy consulted to dose warfarin.  PMH severe MR, afib  INR 1.66 today, subtherapeutic however rising appropriately. Received boosted 7.5 mg dose last night. Last Hgb 12.3, plt 129. Day 4 bridge with enoxaparin. No noted bleeding. Will give additional boosted dose today and possibly resume PTA dose tomorrow based on tomorrow's INR.  PTA warfarin 5 mg po qday  Goal of Therapy:  INR 2-3 Monitor platelets by anticoagulation protocol: Yes   Plan:  Warfarin 7.5mg  po x1 Lovenox 65 mg SQ q12h per MD Daily INR Monitor CBC and s/sx bleeding   Andrey Cota. Diona Foley, PharmD, BCPS Clinical Pharmacist Pager [phone]  01/11/2016,8:58 AM

## 2016-01-11 NOTE — Progress Notes (Signed)
Utilization review completed.  

## 2016-01-11 NOTE — Care Management Important Message (Signed)
Important Message  Patient Details  Name: Brian Martinez MRN: [id number] Date of Birth: 1951/01/15   Medicare Important Message Given:  Yes    [name] May 01/11/2016, 12:18 PM

## 2016-01-11 NOTE — Progress Notes (Signed)
[facility] PROGRESS NOTE  Brian BRANNIGAN [id number] DOB: 05-19-1951 DOA: 01/07/2016 PCP: Lennie Odor PA-C  Outpatient specialist:  Brief HPI:  65 y.o. male with medical history significant for hypertension, severe mitral valve regurgitation, and recent diagnosis of atrial fibrillation on Coumadin who presents to the ED with generalized weakness and dizziness upon standing. The patient was admitted to [facility] approximately one month ago under similar circumstances and was diagnosed with new onset atrial fibrillation with RVR. Rate was difficult to control at that time and the patient was eventually discharged with Cardizem, Pacerone, and digoxin. He had been anticoagulated and discharged with Coumadin. Patient admits to being depressed after his wife's death. But denies any suicidal ideation. Patient says that he had by mouth intake over the past 2 weeks since he was started on new medications during his previous admission at [facility]. Those medications given nausea and poor appetite.   Principal Problem:   Atrial fibrillation with RVR (HCC) Active Problems:   Severe mitral regurgitation   Essential hypertension   Dehydration   Depression   Thrombocytopenia (HCC)   Protein-calorie malnutrition, severe   Assessment/Plan: 1. Atrial fibrillation with RVR- patient was started on Cardizem drip. Wilburn Mylar we started  patient on atenolol 50 mg by mouth twice a day, weaned off  Cardizem drip. Heart rate is well controlled and patient is off Cardizem drip. Digoxin 0.125 mg by mouth daily. Will continue with the same dose of atenolol and digoxin. Continue anticoagulation but Lovenox and warfarin per pharmacy consultation. 2. Depression- patient admits to being depressed since death of his life. Denies suicidal ideation. Psychiatry was consulted and patient started on Remeron for depression. 3. Hypokalemia- potassium replaced. BMP today shows potassium of  3.6. 4. Severe mitral regurgitation-he has been undergoing workup with Dr. Roxy Manns as outpatient follow-up mitral valve repair. 5. Thrombocytopenia- no clear etiology at this time. Today platelet  count is 129   DVT prophylaxis: Full dose anticoagulation Code Status: Full code Family Communication: No family at bedside Disposition Plan: Home with home health PT when  INR therapeutic   Consultants:  None   Procedures:  None  Antibiotics:  None  Subjective: Patient seen and examined, denies chest pain or shortness of breath. Heart rate is better controlled with atenolol. Patient currently on Lovenox bridging, INR still subtherapeutic. Patient was seen by psych and started on Remeron. .Objective: Filed Vitals:   01/10/16 1350 01/10/16 2009 01/11/16 0405 01/11/16 1005  BP: 99/63 92/64 98/69  97/58  Pulse: 90 93 78 142  Temp: 98.6 F (37 C) 98 F (36.7 C) 97.6 F (36.4 C)   TempSrc: Oral Oral Oral   Resp: 18 18 18 18   Height:      Weight:   63.2 kg (139 lb 5.3 oz)   SpO2: 99% 99% 99% 100%    Intake/Output Summary (Last 24 hours) at 01/11/16 1115 Last data filed at 01/10/16 2010  Gross per 24 hour  Intake    840 ml  Output    325 ml  Net    515 ml   Filed Weights   01/09/16 0534 01/10/16 0458 01/11/16 0405  Weight: 61.1 kg (134 lb 11.2 oz) 62.6 kg (138 lb 0.1 oz) 63.2 kg (139 lb 5.3 oz)    Examination:  General exam: Appears calm and comfortable  Respiratory system: Clear to auscultation. Respiratory effort normal. Cardiovascular system: S1 & S2 heard, irregular, No JVD, grade 3/6 systolic murmur at the right labia, rubs, gallops or  clicks. No pedal edema. Gastrointestinal system: Abdomen is nondistended, soft and nontender. No organomegaly or masses felt. Normal bowel sounds heard. Central nervous system: Alert and oriented. No focal neurological deficits. Extremities: Symmetric 5 x 5 power. Skin: No rashes, lesions or ulcers Psychiatry: Judgement and insight  appear normal. Mood & affect appropriate.    Data Reviewed: I have personally reviewed following labs and imaging studies Basic Metabolic Panel:  Recent Labs Lab 01/07/16 2004 01/08/16 0122 01/08/16 0210 01/09/16 0259 01/10/16 0458  NA 146*  --  145 143 141  K 3.9  --  3.1* 3.7 3.6  CL 108  --  108 111 106  CO2 27  --  27 23 26   GLUCOSE 120*  --  143* 105* 91  BUN 31*  --  29* 26* 23*  CREATININE 0.93  --  0.96 0.81 0.78  CALCIUM 8.8*  --  8.5* 8.3* 8.2*  MG  --  2.0  --   --   --    Liver Function Tests:  Recent Labs Lab 01/07/16 2004  AST 39  ALT 54  ALKPHOS 57  BILITOT 1.7*  PROT 6.1*  ALBUMIN 3.1*   CBC:  Recent Labs Lab 01/07/16 2004 01/09/16 0259  WBC 8.1 6.5  NEUTROABS 6.2  --   HGB 15.3 12.3*  HCT 46.0 36.6*  MCV 93.5 93.4  PLT 119* 129*   Cardiac Enzymes:  Recent Labs Lab 01/07/16 2004  CKTOTAL 30*     Studies: No results found.  Scheduled Meds: . atenolol  50 mg Oral BID  . digoxin  0.125 mg Oral Daily  . enoxaparin (LOVENOX) injection  1 mg/kg Subcutaneous Q12H  . feeding supplement  1 Container Oral BID BM  . mirtazapine  7.5 mg Oral QHS  . warfarin  7.5 mg Oral ONCE-1800  . Warfarin - Pharmacist Dosing Inpatient   Does not apply q1800   Continuous Infusions:       Time spent: 25 min    [facility] Pager [phone]. If 7PM-7AM, please contact night-coverage at [email or web address], password [facility] 01/11/2016, 11:15 AM  LOS: 4 days

## 2016-01-11 NOTE — Clinical Social Work Note (Signed)
CSW met with patient at beside. Patient was resting comfortably in bed. CSW inquired if patient needed help with paying bills or having his water turned back on. Patient reported he forgot to pay his water bill due to feeling bad. Patient reported his family supports have paid his water bill and his water is now on. Patient discussed that he plans to surround himself with more family support when he goes home. He reported that he plans to call hospice and join another support group. CSW signing off.   [employer], LCSW [phone]

## 2016-01-11 NOTE — Consult Note (Signed)
[facility] Consult follow-up  Reason for Consult:  depression Referring Physician:  Dr. Darrick Meigs Patient Identification: Brian Martinez MRN:  [id number] Principal Diagnosis: Atrial fibrillation with RVR [facility]) Diagnosis:   Patient Active Problem List   Diagnosis Date Noted  . Protein-calorie malnutrition, severe [E43] 01/09/2016  . Depression [F32.9] 01/08/2016  . Thrombocytopenia ([place]) [D69.6] 01/08/2016  . Dehydration [E86.0] 01/07/2016  . Chest discomfort [R07.89]   . Community acquired pneumonia [J18.9]   . Essential hypertension [I10]   . Atrial fibrillation with RVR ([place]) [I48.91] 12/03/2015  . AKI (acute kidney injury) ([place]) [N17.9] 12/03/2015  . CAP (community acquired pneumonia) [J18.9] 12/03/2015  . Thrombocytosis ([place]) [D47.3] 12/03/2015  . Hyperlipidemia [E78.5] 09/04/2015  . Hypertensive heart disease [I11.9] 09/04/2015  . Severe mitral regurgitation [I34.0] 07/30/2015  . PAC (premature atrial contraction) [I49.1] 06/10/2015  . PVC (premature ventricular contraction) [I49.3] 06/10/2015  . MVP (mitral valve prolapse) [I34.1] 06/10/2015  . Mitral regurgitation [I34.0] 06/10/2015  . Abnormal EKG [R94.31] 06/10/2015    Total Time spent with patient: 30 minutes  Subjective:   Brian Martinez is a 65 y.o. male patient admitted with depression and generalized weakness.  HPI:  Brian Martinez is a 65 years old male admitted to [facility] with increased symptoms of anxiety, depression, isolation, social withdrawal, disturbed sleep and appetite. Patient also reportedly lost 10-15 pounds in the last 90 days. Patient reportedly lost his wife and mother-in-law December 2015 and participated in hospice care for counseling services and bereavement at [facility]. 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 Reportedly was able to keep himself busy with a several  activities for the last 1 year and now he has been suffering with depression and anxiety and also started having cardiac problems. Patient was also recently hospitalized for atrial fibrillation with RVR. Patient denies active/passive suicidal ideation, intention or plans. Patient has no evidence of psychotic symptoms. Patient denies auditory/visual hallucinations, delusions and paranoia. Patient is willing to receive medication for depression, anxiety, disturbed sleep and appetite. Past Psychiatric History: Patient has no history of acute psychiatric hospitalization or outpatient medication management. Patient is his medication from the primary care physician for high blood pressure and recently diagnosed with atrial fibrillation with RVR.   Interval history: Patient seen today for psychiatric consultation follow-up. Patient appeared sitting on the edge of the bed with the meal tray in front of him. Patient stated he is feeling better since started medication. Patient also reportedly has better sleep and appetite. Patient has been tolerating his medication Brian Martinez without adverse effects. Patient hoping his Brian Martinez medication and blood pressure medication will be adjusted before he will be released to outpatient medical services. Patient has no suicidal or homicidal ideation, no evidence of psychosis. Patient is psychiatrically cleared to be discharged to outpatient care when medically stable.  Risk to Self: Is patient at risk for suicide?: No Risk to Others:   Prior Inpatient Therapy:   Prior Outpatient Therapy:    Past Medical History:  Past Medical History  Diagnosis Date  . Heart murmur   . Severe mitral regurgitation 07/30/2015  . MVP (mitral valve prolapse) 06/10/2015  . Abnormal EKG 06/10/2015  . PVC (premature ventricular contraction) 06/10/2015  . Thrombocytopenia ([place])   . Depression   . Dehydration 12/2015  . AKI (acute kidney injury) ([place]) 12/2015    Past Surgical History  Procedure  Laterality  Date  . Inguinal hernia repair     Family History:  Family History  Problem Relation Age of Onset  . Hypertension Father   . Cancer Brother   . Hypertension Brother   . Hypertension Mother   . Parkinsonism Mother   . Hypertension Brother   . Hypertension Brother    Family Psychiatric  History: unknown Social History:  History  Alcohol Use  . Yes    Comment: occasional     History  Drug Use No    Social History   Social History  . Marital Status: Widowed    Spouse Name: N/A  . Number of Children: N/A  . Years of Education: N/A   Social History Main Topics  . Smoking status: Never Smoker   . Smokeless tobacco: Never Used  . Alcohol Use: Yes     Comment: occasional  . Drug Use: No  . Sexual Activity: Not Asked   Other Topics Concern  . None   Social History Narrative   Additional Social History:    Allergies:  No Known Allergies  Labs:  Results for orders placed or performed during the hospital encounter of 01/07/16 (from the past 48 hour(s))  Protime-INR     Status: Abnormal   Collection Time: 01/10/16  4:58 AM  Result Value Ref Range   Prothrombin Time 17.2 (H) 11.6 - 15.2 seconds   INR 1.40 0.00 - 9.76  Basic metabolic panel     Status: Abnormal   Collection Time: 01/10/16  4:58 AM  Result Value Ref Range   Sodium 141 135 - 145 mmol/L   Potassium 3.6 3.5 - 5.1 mmol/L   Chloride 106 101 - 111 mmol/L   CO2 26 22 - 32 mmol/L   Glucose, Bld 91 65 - 99 mg/dL   BUN 23 (H) 6 - 20 mg/dL   Creatinine, Ser 0.78 0.61 - 1.24 mg/dL   Calcium 8.2 (L) 8.9 - 10.3 mg/dL   GFR calc non Af Amer >60 >60 mL/min   GFR calc Af Amer >60 >60 mL/min    Comment: (NOTE) The eGFR has been calculated using the CKD EPI equation. This calculation has not been validated in all clinical situations. eGFR's persistently <60 mL/min signify possible Chronic Kidney Disease.    Anion gap 9 5 - 15  Protime-INR     Status: Abnormal   Collection Time: 01/11/16  3:40 AM   Result Value Ref Range   Prothrombin Time 19.6 (H) 11.6 - 15.2 seconds   INR 1.66 (H) 0.00 - 1.49    Current Facility-Administered Medications  Medication Dose Route Frequency Provider Last Rate Last Dose  . acetaminophen (TYLENOL) tablet 650 mg  650 mg Oral Q4H PRN Vianne Bulls, MD      . atenolol (TENORMIN) tablet 50 mg  50 mg Oral BID Oswald Hillock, MD      . digoxin (LANOXIN) tablet 0.125 mg  0.125 mg Oral Daily Ilene Qua Opyd, MD   0.125 mg at 01/11/16 1013  . enoxaparin (LOVENOX) injection 65 mg  1 mg/kg Subcutaneous Q12H Ilene Qua Opyd, MD   65 mg at 01/11/16 1013  . feeding supplement (BOOST / RESOURCE BREEZE) liquid 1 Container  1 Container Oral BID BM Vianne Bulls, MD   1 Container at 01/11/16 1015  . mirtazapine (Brian Martinez) tablet 7.5 mg  7.5 mg Oral QHS Ambrose Finland, MD   7.5 mg at 01/10/16 2124  . ondansetron (ZOFRAN) injection 4 mg  4 mg  Intravenous Q6H PRN Vianne Bulls, MD      . warfarin (Brian Martinez) tablet 7.5 mg  7.5 mg Oral ONCE-1800 Rebecka Apley, [facility]      . Warfarin - Pharmacist Dosing Inpatient   Does not apply q1800 Franky Macho, [facility]        Musculoskeletal: Strength & Muscle Tone: within normal limits Gait & Station: normal Patient leans: N/A  Psychiatric Specialty Exam: ROS   Blood pressure 97/58, pulse 142, temperature 97.6 F (36.4 C), temperature source Oral, resp. rate 18, height '6\' 1"'$  (1.854 m), weight 63.2 kg (139 lb 5.3 oz), SpO2 100 %.Body mass index is 18.39 kg/(m^2).  General Appearance: Guarded  Eye Contact::  Good  Speech:  Clear and Coherent  Volume:  Normal  Mood:  Depressed  Affect:  Congruent and Depressed  Thought Process:  Coherent and Goal Directed  Orientation:  Full (Time, Place, and Person)  Thought Content:  WDL  Suicidal Thoughts:  No  Homicidal Thoughts:  No  Memory:  Immediate;   Good Recent;   Good Remote;   Fair  Judgement:  Intact  Insight:  Fair  Psychomotor Activity:  Decreased  Concentration:  Good   Recall:  Good  Fund of Knowledge:Good  Language: Good  Akathisia:  Negative  Handed:  Right  AIMS (if indicated):     Assets:  Communication Skills Desire for Improvement Financial Resources/Insurance Housing Leisure Time Resilience Social Support Transportation  ADL's:  Intact  Cognition: WNL  Sleep:      Treatment Plan Summary: Patient has been psychiatrically cleared for outpatient medication management Patient has no safety concerns Continue Brian Martinez 7.5 mg daily at bedtime for depression, poor appetite and insomnia Appreciate psychiatric consultation and we sign off as of today Please contact [phone] or [phone] if needs further assistance   Disposition: Patient will be referred to the outpatient medication management Patient does not meet criteria for psychiatric inpatient admission. Supportive therapy provided about ongoing stressors.  Durward Parcel., MD 01/11/2016 11:50 AM

## 2016-01-12 LAB — PROTIME-INR
INR: 1.83 — ABNORMAL HIGH (ref 0.00–1.49)
Prothrombin Time: 21.1 seconds — ABNORMAL HIGH (ref 11.6–15.2)

## 2016-01-12 LAB — BASIC METABOLIC PANEL
ANION GAP: 7 (ref 5–15)
BUN: 21 mg/dL — AB (ref 6–20)
CALCIUM: 8 mg/dL — AB (ref 8.9–10.3)
CO2: 27 mmol/L (ref 22–32)
CREATININE: 0.78 mg/dL (ref 0.61–1.24)
Chloride: 105 mmol/L (ref 101–111)
GFR calc Af Amer: >60 mL/min (ref >60)
GLUCOSE: 86 mg/dL (ref 65–99)
Potassium: 3.8 mmol/L (ref 3.5–5.1)
Sodium: 139 mmol/L (ref 135–145)

## 2016-01-12 LAB — CBC
HEMATOCRIT: 37.1 % — AB (ref 39.0–52.0)
Hemoglobin: 12.2 g/dL — ABNORMAL LOW (ref 13.0–17.0)
MCH: 30.5 pg (ref 26.0–34.0)
MCHC: 32.9 g/dL (ref 30.0–36.0)
MCV: 92.8 fL (ref 78.0–100.0)
PLATELETS: 111 10*3/uL — AB (ref 150–400)
RBC: 4 MIL/uL — ABNORMAL LOW (ref 4.22–5.81)
RDW: 15 % (ref 11.5–15.5)
WBC: 5.5 10*3/uL (ref 4.0–10.5)

## 2016-01-12 LAB — GLUCOSE, CAPILLARY: Glucose-Capillary: 73 mg/dL (ref 65–99)

## 2016-01-12 MED ORDER — WARFARIN SODIUM 7.5 MG PO TABS: 7.5000 mg | ORAL_TABLET | Freq: Once | ORAL | Status: DC

## 2016-01-12 MED ORDER — ATENOLOL 50 MG PO TABS: 50.0000 mg | ORAL_TABLET | Freq: Two times a day (BID) | ORAL | Status: DC

## 2016-01-12 MED ORDER — MIRTAZAPINE 7.5 MG PO TABS: 7.5000 mg | ORAL_TABLET | Freq: Every day | ORAL | Status: DC

## 2016-01-12 MED ORDER — WARFARIN SODIUM 7.5 MG PO TABS
7.5000 mg | ORAL_TABLET | ORAL | Status: AC
  Administered 2016-01-12: 7.5 mg via ORAL
  Filled 2016-01-12: qty 1

## 2016-01-12 MED ORDER — ENOXAPARIN SODIUM 80 MG/0.8ML ~~LOC~~ SOLN
65.0000 mg | Freq: Once | SUBCUTANEOUS | Status: AC
  Administered 2016-01-12: 65 mg via SUBCUTANEOUS
  Filled 2016-01-12: qty 0.8

## 2016-01-12 MED ORDER — ENOXAPARIN SODIUM 100 MG/ML ~~LOC~~ SOLN: 1.5000 mg/kg | Freq: Once | SUBCUTANEOUS | Status: DC

## 2016-01-12 MED ORDER — DIGOXIN 125 MCG PO TABS: 0.1250 mg | ORAL_TABLET | Freq: Every day | ORAL | Status: DC

## 2016-01-12 MED ORDER — WARFARIN SODIUM 5 MG PO TABS: 5.0000 mg | ORAL_TABLET | Freq: Every day | ORAL | Status: DC

## 2016-01-12 MED FILL — WARFARIN SODIUM 5 MG TABLET: 5 | 30 days supply | Qty: 30 | Fill #0

## 2016-01-12 MED FILL — MIRTAZAPINE 15 MG TABLET: 15 | 30 days supply | Qty: 15 | Fill #0

## 2016-01-12 MED FILL — ATENOLOL 50 MG TABLET: 50 | 30 days supply | Qty: 60 | Fill #0

## 2016-01-12 MED FILL — DIGOXIN 125 MCG TABLET: 125 | 30 days supply | Qty: 30 | Fill #0

## 2016-01-12 NOTE — Care Management Note (Signed)
Case Management Note Previous CM note initiated by Brandt Loosen M,--RN, CM  Patient Details  Name: Brian Martinez MRN: [id number] Date of Birth: August 04, 1951  Subjective/Objective:      [name] admitted 01/07/2016 with Generalized Weakness and dizziness. Recently diagnosed with  Atrial Fibrillation with RVR. 12/03/2015 thru 12/10/2015 during admission at [facility]. Poor po intake which patient attributes to new medications he was prescribed during that hospitalization. Severe Protein Malnutrition. Reports depression which he attributes to the death of his wife of 69 yrs early in 2016. CM consulted by [place] CSW for referral to [facility] as pt needs short term  assistance in his home to ensure compliance with medications.                Action/Plan: CM spoke with pt by phone to obtain his choice of [place] agencies for [facility] and he has chosen [place]. CM spoke with Tiffany, the liasion and arranged for this service. Will continue to follow for additional needs should they arise.    Expected Discharge Date:    01/12/16              Expected Discharge Plan:  [place]  In-House Referral:     Discharge planning Services  CM Consult, Medication Assistance  Post Acute Care Choice:  Home Health Choice offered to:  Patient  DME Arranged:    DME Agency:     HH Arranged:  RN [place] Agency:  [place]  Status of Service:  Completed, signed off  Medicare Important Message Given:  Yes Date Medicare IM Given:    Medicare IM give by:    Date Additional Medicare IM Given:    Additional Medicare Important Message give by:     If discussed at [place] of Stay Meetings, dates discussed:  01/12/16  Discharge Disposition: [place]   Additional Comments:  01/12/16- Marvetta Gibbons RN, BSN - order placed for [facility] with PT/INR check on 4/26- call made to [facility] with [facility] for [facility] needs and pending d/c for today.   Dawayne Patricia, RN 01/12/2016, 11:40 AM

## 2016-01-12 NOTE — Discharge Summary (Addendum)
Physician Discharge Summary  Brian Martinez [id number] DOB: Apr 18, 1951 DOA: 01/07/2016  PCP: REDMON,NOELLE, PA-C  Admit date: 01/07/2016 Discharge date: 01/12/2016  Time spent: 25* minutes  Recommendations for Outpatient Follow-up:  1. Follow PT/INR as outpatient 2. Follow-up cardiology in 1 week   Discharge Diagnoses:  Principal Problem:   Atrial fibrillation with RVR ([place]) Active Problems:   Severe mitral regurgitation   Essential hypertension   Dehydration   Depression   Thrombocytopenia (H[facility])   Protein-calorie malnutrition, severe   Discharge Condition: Stable  Diet recommendation: Low-salt diet  Filed Weights   01/10/16 0458 01/11/16 0405 01/12/16 0459  Weight: 62.6 kg (138 lb 0.1 oz) 63.2 kg (139 lb 5.3 oz) 64.2 kg (141 lb 8.6 oz)    History of present illness:  65 y.o. male with medical history significant for hypertension, severe mitral valve regurgitation, and recent diagnosis of atrial fibrillation on Coumadin who presents to the ED with generalized weakness and dizziness upon standing. The patient was admitted to [facility] approximately one month ago under similar circumstances and was diagnosed with new onset atrial fibrillation with RVR. Rate was difficult to control at that time and the patient was eventually discharged with Cardizem, Pacerone, and digoxin. He had been anticoagulated and discharged with Coumadin. Patient admits to being depressed after his wife's death. But denies any suicidal ideation. Patient says that he had by mouth intake over the past 2 weeks since he was started on new medications during his previous admission at [facility]. Those medications given nausea and poor appetite.  Hospital Course:  1. Atrial fibrillation with RVR-patient did not take Pacerone, Cardizem and digoxin at home, as these medications cause dizziness and poor appetite. patient was started on Cardizem drip. Heart rate was controlled and started  patient on atenolol 50 mg by mouth twice a day, weaned off Cardizem drip. Heart rate is well controlled and patient is off Cardizem drip. Patient also continued on Digoxin 0.125 mg by mouth daily. Will continue with the same dose of atenolol and digoxin. Continued anticoagulation but Lovenox and warfarin per pharmacy consultation. INR is 1.83. Will give 1 dose of warfarin 7.5 mg by mouth 1 before discharge and Lovenox 65 mg  1 dose. Home health RN will check a PT/INR on 01/13/2016. He will continue to use warfarin 5 mg by mouth daily. Home health RN has been set up to help with medication assistance at home. 2. Depression- patient admits to being depressed since death of his life. Denies suicidal ideation. Psychiatry was consulted and patient started on Remeron for depression. Continue with Remeron 7.5 mg by mouth daily 3. Hypokalemia- potassium replaced. BMP today shows potassium of 3.6. 4. Severe mitral regurgitation-he has been undergoing workup with Dr. Roxy Manns as outpatient follow-up mitral valve repair. 5. Thrombocytopenia- no clear etiology at this time. Today platelet count is 129  Procedures:  None  Consultations: None Discharge Exam: Filed Vitals:   01/12/16 0946 01/12/16 1100  BP: 98/59 125/90  Pulse: 90 91  Temp: 98.6 F (37 C)   Resp: 18 20    General: Appears in no acute distress Cardiovascular: S1-S2 normal, irregular rhythm Respiratory: Clear to auscultation bilaterally  Discharge Instructions   Discharge Instructions    Diet - low sodium heart healthy    Complete by:  As directed      Increase activity slowly    Complete by:  As directed           Current Discharge Medication List  START taking these medications   Details  atenolol (TENORMIN) 50 MG tablet Take 1 tablet (50 mg total) by mouth 2 (two) times daily. Qty: 60 tablet, Refills: 3    mirtazapine (REMERON) 7.5 MG tablet Take 1 tablet (7.5 mg total) by mouth at bedtime. Qty: 30 tablet, Refills: 2       CONTINUE these medications which have CHANGED   Details  digoxin (LANOXIN) 0.125 MG tablet Take 1 tablet (0.125 mg total) by mouth daily. Qty: 30 tablet, Refills: 1    warfarin (COUMADIN) 5 MG tablet Take 1 tablet (5 mg total) by mouth daily at 6 PM. Qty: 30 tablet, Refills: 1      CONTINUE these medications which have NOT CHANGED   Details  Cholecalciferol (VITAMIN D3) 5000 UNITS CAPS Take 1 capsule (5,000 Units total) by mouth daily. Qty: 30 capsule, Refills: 3    feeding supplement (BOOST / RESOURCE BREEZE) LIQD Take 1 Container by mouth 2 (two) times daily between meals. Qty: 60 Container, Refills: 5    vitamin B-12 (CYANOCOBALAMIN) 100 MCG tablet Take 1 tablet (100 mcg total) by mouth daily. Qty: 90 tablet, Refills: 3      STOP taking these medications     amiodarone (PACERONE) 200 MG tablet      amiodarone (PACERONE) 400 MG tablet      diltiazem (CARDIZEM CD) 360 MG 24 hr capsule      levofloxacin (LEVAQUIN) 750 MG tablet      metoprolol (LOPRESSOR) 100 MG tablet        No Known Allergies Follow-up Information    Follow up with [facility].   Why:  HH-RN arranged-  CHECK PT/INR on 01/13/16   Contact information:   [address] [place] [address] [phone]        The results of significant diagnostics from this hospitalization (including imaging, microbiology, ancillary and laboratory) are listed below for reference.    Significant Diagnostic Studies: Dg Chest Portable 1 View  01/07/2016  CLINICAL DATA:  Weakness, atrial fibrillation, dizziness with standing, abnormal behavior, hypertensive heart disease EXAM: PORTABLE CHEST 1 VIEW COMPARISON:  Portable exam 2028 hours compared to 12/03/2015 FINDINGS: Upper normal heart size. Atherosclerotic calcification aorta. Mediastinal contours and pulmonary vascularity normal. Emphysematous changes consistent with COPD. RIGHT apex scarring. No acute infiltrate, pleural effusion, or  pneumothorax. Bones diffusely demineralized. IMPRESSION: COPD changes. No acute abnormalities. Resolution of RIGHT lower lobe infiltrate seen on previous exam. Electronically Signed   By: Lavonia Dana M.D.   On: 01/07/2016 20:37    Microbiology: No results found for this or any previous visit (from the past 240 hour(s)).   Labs: Basic Metabolic Panel:  Recent Labs Lab 01/07/16 2004 01/08/16 0122 01/08/16 0210 01/09/16 0259 01/10/16 0458 01/11/16 1121 01/12/16 0505  NA 146*  --  145 143 141  --  139  K 3.9  --  3.1* 3.7 3.6  --  3.8  CL 108  --  108 111 106  --  105  CO2 27  --  27 23 26   --  27  GLUCOSE 120*  --  143* 105* 91  --  86  BUN 31*  --  29* 26* 23*  --  21*  CREATININE 0.93  --  0.96 0.81 0.78  --  0.78  CALCIUM 8.8*  --  8.5* 8.3* 8.2*  --  8.0*  MG  --  2.0  --   --   --  1.8  --  Liver Function Tests:  Recent Labs Lab 01/07/16 2004  AST 39  ALT 54  ALKPHOS 57  BILITOT 1.7*  PROT 6.1*  ALBUMIN 3.1*   CBC:  Recent Labs Lab 01/07/16 2004 01/09/16 0259 01/12/16 0505  WBC 8.1 6.5 5.5  NEUTROABS 6.2  --   --   HGB 15.3 12.3* 12.2*  HCT 46.0 36.6* 37.1*  MCV 93.5 93.4 92.8  PLT 119* 129* PENDING   Cardiac Enzymes:  Recent Labs Lab 01/07/16 2004  CKTOTAL 30*   BNP:  CBG:  Recent Labs Lab 01/11/16 2237  GLUCAP 87       Signed:  Leo Fray S MD.  [facility] 01/12/2016, 11:22 AM

## 2016-01-12 NOTE — Progress Notes (Addendum)
Pt in stable condition. This RN went over discharge instructions and education  with patient, patient verbalised understanding, patient belongings at bedside, iv taken out, cardiac monitor dc, ccmd notified.Patient taken off the unit on wheelchair by this RN

## 2016-01-12 NOTE — Progress Notes (Signed)
ANTICOAGULATION CONSULT NOTE  Pharmacy Consult for warfarin Indication: atrial fibrillation  No Known Allergies  Patient Measurements: Height: 6\' 1"  (185.4 cm) Weight: 141 lb 8.6 oz (64.2 kg) IBW/kg (Calculated) : 79.9  Vital Signs: Temp: 97.7 F (36.5 C) (04/25 0459) Temp Source: Oral (04/25 0459) BP: 111/75 mmHg (04/25 0459) Pulse Rate: 76 (04/25 0459)  Labs:  Recent Labs  01/10/16 0458 01/11/16 0340 01/12/16 0505  HGB  --   --  12.2*  HCT  --   --  37.1*  PLT  --   --  PENDING  LABPROT 17.2* 19.6* 21.1*  INR 1.40 1.66* 1.83*  CREATININE 0.78  --  0.78    Estimated Creatinine Clearance: 82.5 mL/min (by C-G formula based on Cr of 0.78).  Assessment: 65 yo man admitted 01/07/2016 for AFib w/ RVR with medication nonadherence to warfarin. INR 1.22 on admission. Pharmacy consulted to dose warfarin.  PMH severe MR, afib  INR 1.83 today, subtherapeutic however rising appropriately. Received boosted 7.5 mg dose last night. Last Hgb 12.2, plt 129. Day 5 bridge with enoxaparin. No noted bleeding   PTA warfarin 5 mg po qday  Goal of Therapy:  INR 2-3 Monitor platelets by anticoagulation protocol: Yes   Plan:  1. Warfarin 7.5 mg po x1 2. Lovenox 65 mg SQ q12h per MD; when INR therapeutic stop lovenox 3. Daily INR 4. Monitor CBC and s/sx bleeding    Vincenza Hews, PharmD, BCPS 01/12/2016, 8:51 AM Pager: [phone]

## 2016-01-13 DIAGNOSIS — Z7901 Long term (current) use of anticoagulants: Secondary | ICD-10-CM | POA: Diagnosis not present

## 2016-01-15 DIAGNOSIS — Z7901 Long term (current) use of anticoagulants: Secondary | ICD-10-CM | POA: Diagnosis not present

## 2016-01-19 DIAGNOSIS — Z7901 Long term (current) use of anticoagulants: Secondary | ICD-10-CM | POA: Diagnosis not present

## 2016-01-27 DIAGNOSIS — J90 Pleural effusion, not elsewhere classified: Secondary | ICD-10-CM | POA: Diagnosis not present

## 2016-01-27 DIAGNOSIS — R600 Localized edema: Secondary | ICD-10-CM | POA: Diagnosis not present

## 2016-01-27 DIAGNOSIS — I509 Heart failure, unspecified: Secondary | ICD-10-CM | POA: Diagnosis not present

## 2016-02-08 ENCOUNTER — Encounter: Payer: Self-pay | Admitting: Thoracic Surgery (Cardiothoracic Vascular Surgery)

## 2016-02-08 ENCOUNTER — Encounter: Payer: Medicare Other | Admitting: Thoracic Surgery (Cardiothoracic Vascular Surgery)

## 2016-02-08 NOTE — Progress Notes (Signed)
This encounter was created in error - please disregard.

## 2016-02-09 ENCOUNTER — Telehealth: Payer: Self-pay | Admitting: *Deleted

## 2016-04-23 ENCOUNTER — Encounter (HOSPITAL_COMMUNITY): Payer: Self-pay | Admitting: *Deleted

## 2016-04-23 ENCOUNTER — Inpatient Hospital Stay (HOSPITAL_COMMUNITY)
Admission: EM | Admit: 2016-04-23 | Discharge: 2016-05-02 | DRG: 308 | Disposition: A | Discharge status: Home / Self Care | Payer: Medicare Other | Attending: Internal Medicine | Admitting: Internal Medicine

## 2016-04-23 DIAGNOSIS — I482 Chronic atrial fibrillation, unspecified: Secondary | ICD-10-CM

## 2016-04-23 DIAGNOSIS — Z9114 Patient's other noncompliance with medication regimen: Secondary | ICD-10-CM | POA: Diagnosis not present

## 2016-04-23 DIAGNOSIS — I340 Nonrheumatic mitral (valve) insufficiency: Secondary | ICD-10-CM | POA: Diagnosis present

## 2016-04-23 DIAGNOSIS — F32A Depression, unspecified: Secondary | ICD-10-CM | POA: Diagnosis present

## 2016-04-23 DIAGNOSIS — F3289 Other specified depressive episodes: Secondary | ICD-10-CM | POA: Diagnosis present

## 2016-04-23 DIAGNOSIS — Z681 Body mass index (BMI) 19 or less, adult: Secondary | ICD-10-CM | POA: Diagnosis not present

## 2016-04-23 DIAGNOSIS — Z91199 Patient's noncompliance with other medical treatment and regimen due to unspecified reason: Secondary | ICD-10-CM

## 2016-04-23 DIAGNOSIS — E43 Unspecified severe protein-calorie malnutrition: Secondary | ICD-10-CM | POA: Diagnosis present

## 2016-04-23 DIAGNOSIS — E872 Acidosis: Secondary | ICD-10-CM | POA: Diagnosis not present

## 2016-04-23 DIAGNOSIS — Z8249 Family history of ischemic heart disease and other diseases of the circulatory system: Secondary | ICD-10-CM

## 2016-04-23 DIAGNOSIS — E876 Hypokalemia: Secondary | ICD-10-CM

## 2016-04-23 DIAGNOSIS — I4891 Unspecified atrial fibrillation: Secondary | ICD-10-CM | POA: Diagnosis not present

## 2016-04-23 DIAGNOSIS — F329 Major depressive disorder, single episode, unspecified: Secondary | ICD-10-CM | POA: Diagnosis present

## 2016-04-23 DIAGNOSIS — R000 Tachycardia, unspecified: Secondary | ICD-10-CM | POA: Diagnosis not present

## 2016-04-23 DIAGNOSIS — R634 Abnormal weight loss: Secondary | ICD-10-CM

## 2016-04-23 DIAGNOSIS — T45516A Underdosing of anticoagulants, initial encounter: Secondary | ICD-10-CM | POA: Diagnosis present

## 2016-04-23 DIAGNOSIS — E860 Dehydration: Secondary | ICD-10-CM | POA: Diagnosis present

## 2016-04-23 DIAGNOSIS — Y92009 Unspecified place in unspecified non-institutional (private) residence as the place of occurrence of the external cause: Secondary | ICD-10-CM

## 2016-04-23 DIAGNOSIS — I341 Nonrheumatic mitral (valve) prolapse: Secondary | ICD-10-CM | POA: Diagnosis present

## 2016-04-23 DIAGNOSIS — Z9119 Patient's noncompliance with other medical treatment and regimen: Secondary | ICD-10-CM

## 2016-04-23 DIAGNOSIS — I480 Paroxysmal atrial fibrillation: Secondary | ICD-10-CM | POA: Diagnosis not present

## 2016-04-23 HISTORY — DX: Chronic atrial fibrillation, unspecified: I48.20

## 2016-04-23 HISTORY — DX: Dizziness and giddiness: R42

## 2016-04-23 HISTORY — DX: Essential (primary) hypertension: I10

## 2016-04-23 HISTORY — DX: Basal cell carcinoma of skin, unspecified: C44.91

## 2016-04-23 LAB — COMPREHENSIVE METABOLIC PANEL
ALBUMIN: 3.6 g/dL (ref 3.5–5.0)
ALT: 12 U/L — ABNORMAL LOW (ref 17–63)
ANION GAP: 16 — AB (ref 5–15)
AST: 16 U/L (ref 15–41)
Alkaline Phosphatase: 33 U/L — ABNORMAL LOW (ref 38–126)
BUN: 47 mg/dL — ABNORMAL HIGH (ref 6–20)
CO2: 20 mmol/L — ABNORMAL LOW (ref 22–32)
Calcium: 9.3 mg/dL (ref 8.9–10.3)
Chloride: 103 mmol/L (ref 101–111)
Creatinine, Ser: 1.29 mg/dL — ABNORMAL HIGH (ref 0.61–1.24)
GFR calc non Af Amer: 56 mL/min — ABNORMAL LOW (ref >60)
GLUCOSE: 80 mg/dL (ref 65–99)
POTASSIUM: 4.3 mmol/L (ref 3.5–5.1)
SODIUM: 139 mmol/L (ref 135–145)
TOTAL PROTEIN: 6 g/dL — AB (ref 6.5–8.1)
Total Bilirubin: 1.4 mg/dL — ABNORMAL HIGH (ref 0.3–1.2)

## 2016-04-23 LAB — TROPONIN I: Troponin I: <0.03 ng/mL (ref <0.03)

## 2016-04-23 LAB — CBG MONITORING, ED: GLUCOSE-CAPILLARY: 70 mg/dL (ref 65–99)

## 2016-04-23 LAB — PROTIME-INR
INR: 1.14
PROTHROMBIN TIME: 14.7 s (ref 11.4–15.2)

## 2016-04-23 LAB — CBC WITH DIFFERENTIAL/PLATELET
BASOS PCT: 0 %
Basophils Absolute: 0 10*3/uL (ref 0.0–0.1)
EOS ABS: 0 10*3/uL (ref 0.0–0.7)
EOS PCT: 0 %
HCT: 51.5 % (ref 39.0–52.0)
Hemoglobin: 18.3 g/dL — ABNORMAL HIGH (ref 13.0–17.0)
LYMPHS ABS: 1.2 10*3/uL (ref 0.7–4.0)
Lymphocytes Relative: 22 %
MCH: 33.1 pg (ref 26.0–34.0)
MCHC: 35.5 g/dL (ref 30.0–36.0)
MCV: 93.1 fL (ref 78.0–100.0)
MONOS PCT: 4 %
Monocytes Absolute: 0.2 10*3/uL (ref 0.1–1.0)
Neutro Abs: 4.2 10*3/uL (ref 1.7–7.7)
Neutrophils Relative %: 74 %
PLATELETS: 135 10*3/uL — AB (ref 150–400)
RBC: 5.53 MIL/uL (ref 4.22–5.81)
RDW: 12.5 % (ref 11.5–15.5)
WBC: 5.6 10*3/uL (ref 4.0–10.5)

## 2016-04-23 LAB — DIGOXIN LEVEL

## 2016-04-23 LAB — I-STAT CG4 LACTIC ACID, ED: Lactic Acid, Venous: 2.03 mmol/L (ref 0.5–1.9)

## 2016-04-23 MED ORDER — HEPARIN (PORCINE) IN NACL 100-0.45 UNIT/ML-% IJ SOLN
900.0000 [IU]/h | INTRAMUSCULAR | Status: DC
  Administered 2016-04-24 – 2016-04-27 (×4): 800 [IU]/h via INTRAVENOUS
  Administered 2016-04-28 – 2016-04-29 (×2): 900 [IU]/h via INTRAVENOUS
  Filled 2016-04-23 (×8): qty 250

## 2016-04-23 MED ORDER — HEPARIN BOLUS VIA INFUSION
3000.0000 [IU] | Freq: Once | INTRAVENOUS | Status: AC
  Administered 2016-04-24: 3000 [IU] via INTRAVENOUS
  Filled 2016-04-23: qty 3000

## 2016-04-23 MED ORDER — DILTIAZEM HCL 25 MG/5ML IV SOLN
20.0000 mg | Freq: Once | INTRAVENOUS | Status: AC
  Administered 2016-04-23: 20 mg via INTRAVENOUS

## 2016-04-23 MED ORDER — DILTIAZEM HCL 100 MG IV SOLR
5.0000 mg/h | INTRAVENOUS | Status: AC
  Administered 2016-04-23: 5 mg/h via INTRAVENOUS
  Administered 2016-04-24 – 2016-04-25 (×3): 7.5 mg/h via INTRAVENOUS
  Administered 2016-04-25: 5 mg/h via INTRAVENOUS
  Filled 2016-04-23 (×5): qty 100

## 2016-04-23 MED ORDER — DILTIAZEM LOAD VIA INFUSION
15.0000 mg | Freq: Once | INTRAVENOUS | Status: AC
  Administered 2016-04-23: 15 mg via INTRAVENOUS
  Filled 2016-04-23: qty 15

## 2016-04-23 NOTE — ED Notes (Signed)
Admitting MD at bedside.

## 2016-04-23 NOTE — ED Provider Notes (Signed)
[facility] DEPT Provider Note   CSN: [id number] Arrival date & time: 04/23/16  2145  First Provider Contact:  First MD Initiated Contact with Patient 04/23/16 2152        History   Chief Complaint Chief Complaint  Patient presents with  . Atrial Fibrillation  . Dehydration    HPI Brian Martinez is a 65 y.o. male.  The history is provided by the patient.  Atrial Fibrillation   He has a history of atrial fibrillation, mitral regurgitation, hypertension and noted that his heart was racing tonight. He denies chest pain, heaviness, tightness, pressure. He had metastases that he has not been completely compliant with his medications but he is very vague about how much medication he is actually been taking. He is a very vague and elusive his started in.  Past Medical History:  Diagnosis Date  . Abnormal EKG 06/10/2015  . AKI (acute kidney injury) ([place]) 12/2015  . Dehydration 12/2015  . Depression   . Heart murmur   . MVP (mitral valve prolapse) 06/10/2015  . PVC (premature ventricular contraction) 06/10/2015  . Severe mitral regurgitation 07/30/2015  . Thrombocytopenia [facility])     Patient Active Problem List   Diagnosis Date Noted  . Protein-calorie malnutrition, severe 01/09/2016  . Depression 01/08/2016  . Thrombocytopenia ([place]) 01/08/2016  . Dehydration 01/07/2016  . Chest discomfort   . Community acquired pneumonia   . Essential hypertension   . Atrial fibrillation with RVR ([place]) 12/03/2015  . AKI (acute kidney injury) ([place]) 12/03/2015  . CAP (community acquired pneumonia) 12/03/2015  . Thrombocytosis ([place]) 12/03/2015  . Hyperlipidemia 09/04/2015  . Hypertensive heart disease 09/04/2015  . Severe mitral regurgitation 07/30/2015  . PAC (premature atrial contraction) 06/10/2015  . PVC (premature ventricular contraction) 06/10/2015  . MVP (mitral valve prolapse) 06/10/2015  . Mitral regurgitation 06/10/2015  . Abnormal EKG 06/10/2015    Past Surgical History:    Procedure Laterality Date  . INGUINAL HERNIA REPAIR         Home Medications    Prior to Admission medications   Medication Sig Start Date End Date Taking? Authorizing Provider  atenolol (TENORMIN) 50 MG tablet Take 1 tablet (50 mg total) by mouth 2 (two) times daily. 01/12/16   Oswald Hillock, MD  Cholecalciferol (VITAMIN D3) 5000 UNITS CAPS Take 1 capsule (5,000 Units total) by mouth daily. Patient not taking: Reported on 01/07/2016 06/11/15   Dorothy Spark, MD  digoxin (LANOXIN) 0.125 MG tablet Take 1 tablet (0.125 mg total) by mouth daily. 01/12/16   Oswald Hillock, MD  feeding supplement (BOOST / RESOURCE BREEZE) LIQD Take 1 Container by mouth 2 (two) times daily between meals. Patient not taking: Reported on 01/07/2016 12/10/15   Annita Brod, MD  mirtazapine (REMERON) 7.5 MG tablet Take 1 tablet (7.5 mg total) by mouth at bedtime. 01/12/16   Oswald Hillock, MD  vitamin B-12 (CYANOCOBALAMIN) 100 MCG tablet Take 1 tablet (100 mcg total) by mouth daily. Patient not taking: Reported on 01/07/2016 06/11/15   Dorothy Spark, MD  warfarin (COUMADIN) 5 MG tablet Take 1 tablet (5 mg total) by mouth daily at 6 PM. 01/13/16   Oswald Hillock, MD    Family History Family History  Problem Relation Age of Onset  . Hypertension Father   . Cancer Brother   . Hypertension Brother   . Hypertension Mother   . Parkinsonism Mother   . Hypertension Brother   . Hypertension Brother  Social History Social History  Substance Use Topics  . Smoking status: Never Smoker  . Smokeless tobacco: Never Used  . Alcohol use Yes     Comment: occasional     Allergies   Review of patient's allergies indicates no known allergies.   Review of Systems Review of Systems  All other systems reviewed and are negative.    Physical Exam Updated Vital Signs BP 125/78 (BP Location: Right Arm)   Pulse 66   Temp 97.7 F (36.5 C) (Oral)   Resp 20   Ht 6\' 1"  (1.854 m)   Wt 130 lb (59 kg)   SpO2 100%    BMI 17.15 kg/m   Physical Exam  Nursing note and vitals reviewed.  Somewhat cachectic appearing 65 year old male, resting comfortably and in no acute distress. Vital signs are significant for tachycardia. Oxygen saturation is 100%, which is normal. Head is normocephalic and atraumatic. PERRLA, EOMI. Oropharynx is clear. Neck is nontender and supple without adenopathy or JVD. Back is nontender and there is no CVA tenderness. Lungs are clear without rales, wheezes, or rhonchi. Chest is nontender. Heart is tachycardic and irregular with 3/6 systolic murmur. Abdomen is soft, flat, nontender without masses or hepatosplenomegaly and peristalsis is normoactive. Extremities have no cyanosis or edema, full range of motion is present. Skin is warm and dry without rash. Neurologic: He is awake and alert but slow to answer questions, cranial nerves are intact, there are no motor or sensory deficits.  ED Treatments / Results  Labs (all labs ordered are listed, but only abnormal results are displayed) Labs Reviewed  COMPREHENSIVE METABOLIC PANEL - Abnormal; Notable for the following:       Result Value   CO2 20 (*)    BUN 47 (*)    Creatinine, Ser 1.29 (*)    Total Protein 6.0 (*)    ALT 12 (*)    Alkaline Phosphatase 33 (*)    Total Bilirubin 1.4 (*)    GFR calc non Af Amer 56 (*)    Anion gap 16 (*)    All other components within normal limits  CBC WITH DIFFERENTIAL/PLATELET - Abnormal; Notable for the following:    Hemoglobin 18.3 (*)    Platelets 135 (*)    All other components within normal limits  DIGOXIN LEVEL - Abnormal; Notable for the following:    Digoxin Level <0.2 (*)    All other components within normal limits  I-STAT CG4 LACTIC ACID, ED - Abnormal; Notable for the following:    Lactic Acid, Venous 2.03 (*)    All other components within normal limits  PROTIME-INR  TROPONIN I  HEPARIN LEVEL (UNFRACTIONATED)  CBC  CBG MONITORING, ED    EKG  EKG  Interpretation  Date/Time:  Saturday April 23 2016 21:57:38 EDT Ventricular Rate:  127 PR Interval:    QRS Duration: 108 QT Interval:  327 QTC Calculation: 476 R Axis:   53 Text Interpretation:  Atrial fibrillation Probable anteroseptal infarct, old Repol abnrm suggests ischemia, anterolateral When compared with ECG of 01/07/2016, No significant change was found Confirmed by [facility]  MD, Luverne Farone ([id number]) on 04/23/2016 10:01:24 PM       Procedures Procedures (including critical care time) CRITICAL CARE Performed by: [id number] Total critical care time: 45 minutes Critical care time was exclusive of separately billable procedures and treating other patients. Critical care was necessary to treat or prevent imminent or life-threatening deterioration. Critical care was time spent personally by me on the  following activities: development of treatment plan with patient and/or surrogate as well as nursing, discussions with consultants, evaluation of patient's response to treatment, examination of patient, obtaining history from patient or surrogate, ordering and performing treatments and interventions, ordering and review of laboratory studies, ordering and review of radiographic studies, pulse oximetry and re-evaluation of patient's condition.  Medications Ordered in ED Medications  diltiazem (CARDIZEM) 1 mg/mL load via infusion 15 mg (not administered)    And  diltiazem (CARDIZEM) 100 mg in dextrose 5 % 100 mL (1 mg/mL) infusion (not administered)     Initial Impression / Assessment and Plan / ED Course  I have reviewed the triage vital signs and the nursing notes.  Pertinent labs that were available during my care of the patient were reviewed by me and considered in my medical decision making (see chart for details).  Clinical Course    Atrial fibrillation with rapid ventricular response. Old records are reviewed and he has 2 recent hospitalizations for same. He has a history of  medication noncompliance. He'll be started on diltiazem drip and laboratory evaluation obtained. We'll check INR and digoxin level since he is taking warfarin and digoxin. Also, of note, he missed an appointment with cardiothoracic surgery. He thinks he was in [place] for some family issues when that appointment occurred but cannot explain why he did not try to reschedule it.  After initial bolus of diltiazem, heart rate has not responded adequately. Is given a second bolus of diltiazem. Case is discussed with Dr. Eulas Post of [facility] who agrees to admit him under observation status. He will be admitted to the stepdown unit. Of note, digoxin level has come back undetectable and INR is come back normal (not therapeutic). I doubt he has been taking any of his medications for at least the last several days.  CHADDS-VASC score equals 2  Final Clinical Impressions(s) / ED Diagnoses   Final diagnoses:  Atrial fibrillation with rapid ventricular response ([place])  Medically noncompliant    New Prescriptions New Prescriptions   No medications on file     Delora Fuel, MD [id number]

## 2016-04-23 NOTE — Progress Notes (Addendum)
ANTICOAGULATION CONSULT NOTE - Initial Consult  Pharmacy Consult for Heparin/Warfarin Indication: atrial fibrillation  No Known Allergies  Patient Measurements: Height: 6\' 1"  (185.4 cm) Weight: 130 lb (59 kg) IBW/kg (Calculated) : 79.9 Vital Signs: Temp: 97.7 F (36.5 C) (08/05 2145) Temp Source: Oral (08/05 2145) BP: 116/94 (08/05 2315) Pulse Rate: 48 (08/05 2315)  Labs:  Recent Labs  04/23/16 2214  HGB 18.3*  HCT 51.5  PLT 135*  LABPROT 14.7  INR 1.14  CREATININE 1.29*  TROPONINI <0.03    Estimated Creatinine Clearance: 47 mL/min (by C-G formula based on SCr of 1.29 mg/dL).   Medical History: Past Medical History:  Diagnosis Date  . Abnormal EKG 06/10/2015  . AKI (acute kidney injury) ([place]) 12/2015  . Dehydration 12/2015  . Depression   . Heart murmur   . MVP (mitral valve prolapse) 06/10/2015  . PVC (premature ventricular contraction) 06/10/2015  . Severe mitral regurgitation 07/30/2015  . Thrombocytopenia [facility])     Assessment: 65 y/o M here with afib, has hx of afib and supposed to be on warfarin, from ED notes its appears pt is not very compliant with meds, consistent with INR of 1.14. Hgb ok, plts on the low side, mild bump in Scr.   Goal of Therapy:  Heparin level 0.3-0.7 units/ml Monitor platelets by anticoagulation protocol: Yes   Plan:  -Heparin 3000 units BOLUS -Start heparin drip at 800 units/hr -0800 HL -Daily CBC/HL -Monitor for bleeding  Narda Bonds 04/23/2016,11:25 PM  ======================= Re-starting warfarin -Give warfarin 5 mg PO x 1 now -Daily PT/INR -Continue heparin until INR is >2 Narda Bonds, Pharmd ========================

## 2016-04-23 NOTE — ED Triage Notes (Signed)
Pt to ED by EMS c/o afib RVR. Pt lives alone, called EMS tonight for fluttering in chest. Denies pain. EMS did orthostatics: lying bp 110/60 rate 130-160, standing pt's bp dropped to 65/30 with a heart rate of 230. Cyanosis noted to nail beds, pt denies shortness of breath. Has had significant weight loss since April. Pt unsure if he's taken medications as prescribed. EMS gave 324 mg ASA enroute

## 2016-04-24 ENCOUNTER — Encounter (HOSPITAL_COMMUNITY): Payer: Self-pay | Admitting: *Deleted

## 2016-04-24 DIAGNOSIS — R634 Abnormal weight loss: Secondary | ICD-10-CM

## 2016-04-24 DIAGNOSIS — Z9119 Patient's noncompliance with other medical treatment and regimen: Secondary | ICD-10-CM | POA: Diagnosis not present

## 2016-04-24 DIAGNOSIS — I4891 Unspecified atrial fibrillation: Secondary | ICD-10-CM | POA: Diagnosis not present

## 2016-04-24 DIAGNOSIS — I340 Nonrheumatic mitral (valve) insufficiency: Secondary | ICD-10-CM | POA: Diagnosis present

## 2016-04-24 DIAGNOSIS — F3289 Other specified depressive episodes: Secondary | ICD-10-CM | POA: Diagnosis present

## 2016-04-24 DIAGNOSIS — E876 Hypokalemia: Secondary | ICD-10-CM | POA: Diagnosis not present

## 2016-04-24 DIAGNOSIS — I341 Nonrheumatic mitral (valve) prolapse: Secondary | ICD-10-CM | POA: Diagnosis not present

## 2016-04-24 DIAGNOSIS — E43 Unspecified severe protein-calorie malnutrition: Secondary | ICD-10-CM | POA: Diagnosis present

## 2016-04-24 DIAGNOSIS — Z9114 Patient's other noncompliance with medication regimen: Secondary | ICD-10-CM | POA: Diagnosis not present

## 2016-04-24 DIAGNOSIS — Z681 Body mass index (BMI) 19 or less, adult: Secondary | ICD-10-CM | POA: Diagnosis not present

## 2016-04-24 DIAGNOSIS — T45516A Underdosing of anticoagulants, initial encounter: Secondary | ICD-10-CM | POA: Diagnosis present

## 2016-04-24 DIAGNOSIS — Z8249 Family history of ischemic heart disease and other diseases of the circulatory system: Secondary | ICD-10-CM | POA: Diagnosis not present

## 2016-04-24 DIAGNOSIS — I482 Chronic atrial fibrillation: Secondary | ICD-10-CM | POA: Diagnosis not present

## 2016-04-24 DIAGNOSIS — E860 Dehydration: Secondary | ICD-10-CM | POA: Diagnosis present

## 2016-04-24 DIAGNOSIS — F329 Major depressive disorder, single episode, unspecified: Secondary | ICD-10-CM | POA: Diagnosis not present

## 2016-04-24 DIAGNOSIS — Y92009 Unspecified place in unspecified non-institutional (private) residence as the place of occurrence of the external cause: Secondary | ICD-10-CM | POA: Diagnosis not present

## 2016-04-24 DIAGNOSIS — E872 Acidosis: Secondary | ICD-10-CM | POA: Diagnosis present

## 2016-04-24 LAB — BASIC METABOLIC PANEL
Anion gap: 9 (ref 5–15)
BUN: 42 mg/dL — ABNORMAL HIGH (ref 6–20)
CO2: 27 mmol/L (ref 22–32)
Calcium: 8.6 mg/dL — ABNORMAL LOW (ref 8.9–10.3)
Chloride: 102 mmol/L (ref 101–111)
Creatinine, Ser: 1.08 mg/dL (ref 0.61–1.24)
Glucose, Bld: 236 mg/dL — ABNORMAL HIGH (ref 65–99)
Potassium: 3.1 mmol/L — ABNORMAL LOW (ref 3.5–5.1)
Sodium: 138 mmol/L (ref 135–145)

## 2016-04-24 LAB — T4, FREE: Free T4: 0.9 ng/dL (ref 0.61–1.12)

## 2016-04-24 LAB — MRSA PCR SCREENING: MRSA by PCR: NEGATIVE

## 2016-04-24 LAB — CBC
HEMATOCRIT: 44.3 % (ref 39.0–52.0)
HEMOGLOBIN: 15.2 g/dL (ref 13.0–17.0)
MCH: 31.5 pg (ref 26.0–34.0)
MCHC: 34.3 g/dL (ref 30.0–36.0)
MCV: 91.7 fL (ref 78.0–100.0)
Platelets: 117 10*3/uL — ABNORMAL LOW (ref 150–400)
RBC: 4.83 MIL/uL (ref 4.22–5.81)
RDW: 12.6 % (ref 11.5–15.5)
WBC: 5.8 10*3/uL (ref 4.0–10.5)

## 2016-04-24 LAB — HEPARIN LEVEL (UNFRACTIONATED): HEPARIN UNFRACTIONATED: 0.5 [IU]/mL (ref 0.30–0.70)

## 2016-04-24 LAB — TROPONIN I
Troponin I: <0.03 ng/mL (ref <0.03)
Troponin I: <0.03 ng/mL (ref <0.03)

## 2016-04-24 LAB — MAGNESIUM: Magnesium: 2.1 mg/dL (ref 1.7–2.4)

## 2016-04-24 LAB — PROTIME-INR
INR: 1.28
PROTHROMBIN TIME: 16.1 s — AB (ref 11.4–15.2)

## 2016-04-24 LAB — TSH: TSH: 0.989 u[IU]/mL (ref 0.350–4.500)

## 2016-04-24 LAB — LACTIC ACID, PLASMA
LACTIC ACID, VENOUS: 2.5 mmol/L — AB (ref 0.5–1.9)
LACTIC ACID, VENOUS: 2.4 mmol/L — AB (ref 0.5–1.9)

## 2016-04-24 LAB — VITAMIN B12: Vitamin B-12: 388 pg/mL (ref 180–914)

## 2016-04-24 MED ORDER — ONDANSETRON HCL 4 MG/2ML IJ SOLN: 4.0000 mg | Freq: Four times a day (QID) | INTRAMUSCULAR | Status: DC | PRN

## 2016-04-24 MED ORDER — DIGOXIN 125 MCG PO TABS
0.1250 mg | ORAL_TABLET | Freq: Every day | ORAL | Status: DC
  Administered 2016-04-24 – 2016-05-02 (×9): 0.125 mg via ORAL
  Filled 2016-04-24 (×9): qty 1

## 2016-04-24 MED ORDER — ACETAMINOPHEN 325 MG PO TABS: 650.0000 mg | ORAL_TABLET | ORAL | Status: DC | PRN

## 2016-04-24 MED ORDER — MIRTAZAPINE 7.5 MG PO TABS
7.5000 mg | ORAL_TABLET | Freq: Every day | ORAL | Status: DC
  Administered 2016-04-24 – 2016-05-01 (×9): 7.5 mg via ORAL
  Filled 2016-04-24 (×10): qty 1

## 2016-04-24 MED ORDER — SODIUM CHLORIDE 0.9 % IV SOLN
INTRAVENOUS | Status: DC
  Administered 2016-04-24: 02:00:00 via INTRAVENOUS

## 2016-04-24 MED ORDER — ENSURE ENLIVE PO LIQD
237.0000 mL | Freq: Two times a day (BID) | ORAL | Status: DC
  Administered 2016-04-24 – 2016-05-02 (×13): 237 mL via ORAL

## 2016-04-24 MED ORDER — POTASSIUM CHLORIDE CRYS ER 20 MEQ PO TBCR
40.0000 meq | EXTENDED_RELEASE_TABLET | Freq: Two times a day (BID) | ORAL | Status: AC
  Administered 2016-04-24 – 2016-04-25 (×3): 40 meq via ORAL
  Filled 2016-04-24 (×3): qty 2

## 2016-04-24 MED ORDER — WARFARIN SODIUM 5 MG PO TABS
5.0000 mg | ORAL_TABLET | Freq: Once | ORAL | Status: AC
  Administered 2016-04-24: 5 mg via ORAL
  Filled 2016-04-24: qty 1

## 2016-04-24 MED ORDER — ASPIRIN EC 81 MG PO TBEC
81.0000 mg | DELAYED_RELEASE_TABLET | Freq: Every day | ORAL | Status: DC
  Administered 2016-04-24 – 2016-05-01 (×8): 81 mg via ORAL
  Filled 2016-04-24 (×9): qty 1

## 2016-04-24 MED ORDER — SODIUM CHLORIDE 0.9 % IV SOLN
INTRAVENOUS | Status: DC
  Administered 2016-04-24 – 2016-04-27 (×8): via INTRAVENOUS

## 2016-04-24 MED ORDER — WARFARIN - PHARMACIST DOSING INPATIENT
Freq: Every day | Status: DC
  Administered 2016-04-29: 18:00:00

## 2016-04-24 MED ORDER — WARFARIN SODIUM 5 MG PO TABS: 5.0000 mg | ORAL_TABLET | Freq: Every day | ORAL | Status: DC

## 2016-04-24 NOTE — Progress Notes (Signed)
[facility] [facility] TEAM 1 - Stepdown/ICU TEAM PROGRESS NOTE  Brian Martinez [id number] DOB: [birth year] DOA: 04/23/2016 PCP: REDMON,NOELLE, PA-C  Admit HPI / Brief Narrative: [name] with a history of Depression, Mitral valve regurgitation, and atrial fibrillation with RVR (he is supposed to be on warfarin anticoagulation, CHADS-VASc score of 2) who presented to the ED for evaluation of light-headedness/dizziness and palpitations.  He denied chest pain or shortness of breath.  He admitted to noncompliance with digoxin and warfarin.  He reported loss of appetite, weight loss, loss of interest in social interactions with friends, and forgetfulness. His wife died over one year ago and he retired last year.  June was rough because his wife's birthday and their wedding anniversary fall within that month.  He denied suicidal or homicidal ideation.  In the ED he was found to be in atrial fibrillation with a rapid ventricular response.  He received a total of 35mg  of IV cardizem via bolus and then a cardizem infusion at 7.5mg /hr.  Troponin negative.  INR normal.  Digoxin level undetectable.  Heparin infusion started per protocol.    HPI/Subjective: Pt seen for f/u visit.  Assessment/Plan:  Chronic atrial fibrillation with acute RVR Due to noncompliance with medications - TSH normal - magnesium normal - CHA2DS2-VASc score is 2 - warfarin utilized due to valvular disease  Mitral valve prolapse w/ severe regurgitation   Hypokalemia  Volume depletion - mild lactic acidosis  Depression  Code Status: FULL Family Communication: no family present at time of exam Disposition Plan: SDU  Consultants: None  Procedures: None  Antibiotics: None  DVT prophylaxis: IV heparin  Objective: Blood pressure (!) 84/66, pulse 77, temperature 97.4 F (36.3 C), temperature source Oral, resp. rate 16, height 6\' 1"  (1.854 m), weight 56.8 kg (125 lb 3.2 oz), SpO2 97 %.  Intake/Output Summary (Last 24 hours)  at 04/24/16 1103 Last data filed at 04/24/16 0911  Gross per 24 hour  Intake           1742.5 ml  Output                0 ml  Net           1742.5 ml    Exam: Pt seen for f/u visit.  Data Reviewed: Basic Metabolic Panel:  Recent Labs Lab 04/23/16 2214 04/24/16 0221 04/24/16 0630  NA 139  --  138  K 4.3  --  3.1*  CL 103  --  102  CO2 20*  --  27  GLUCOSE 80  --  236*  BUN 47*  --  42*  CREATININE 1.29*  --  1.08  CALCIUM 9.3  --  8.6*  MG  --  2.1  --     CBC:  Recent Labs Lab 04/23/16 2214 04/24/16 0630  WBC 5.6 5.8  NEUTROABS 4.2  --   HGB 18.3* 15.2  HCT 51.5 44.3  MCV 93.1 91.7  PLT 135* 117*    Liver Function Tests:  Recent Labs Lab 04/23/16 2214  AST 16  ALT 12*  ALKPHOS 33*  BILITOT 1.4*  PROT 6.0*  ALBUMIN 3.6   Coags:  Recent Labs Lab 04/23/16 2214 04/24/16 0630  INR 1.14 1.28   Cardiac Enzymes:  Recent Labs Lab 04/23/16 2214 04/24/16 0221 04/24/16 0630  TROPONINI <0.03 <0.03 <0.03   CBG:  Recent Labs Lab 04/23/16 2208  GLUCAP 70    Recent Results (from the past 240 hour(s))  MRSA PCR Screening  Status: None   Collection Time: 04/24/16  2:17 AM  Result Value Ref Range Status   MRSA by PCR NEGATIVE NEGATIVE Final    Comment:        The GeneXpert MRSA Assay (FDA approved for NASAL specimens only), is one component of a comprehensive MRSA colonization surveillance program. It is not intended to diagnose MRSA infection nor to guide or monitor treatment for MRSA infections.      Studies:   Recent x-ray studies have been reviewed in detail by the Attending Physician  Scheduled Meds:  Scheduled Meds: . aspirin EC  81 mg Oral Daily  . digoxin  0.125 mg Oral Daily  . feeding supplement (ENSURE ENLIVE)  237 mL Oral BID BM  . mirtazapine  7.5 mg Oral QHS  . warfarin  5 mg Oral ONCE-1800  . Warfarin - Pharmacist Dosing Inpatient   Does not apply q1800    Cherene Altes , MD   [facility] Office  [phone] Pager - Text Page per Amion as per below:  On-Call/Text Page:      [email or web address]      password [email or web address]  If 7PM-7AM, please contact night-coverage [email or web address] Password [email or web address] 04/24/2016, 11:03 AM   LOS: 0 days

## 2016-04-24 NOTE — H&P (Signed)
History and Physical    Brian Martinez [id number] DOB: [birth year] DOA: 04/23/2016  PCP: REDMON,NOELLE, PA-C   Patient coming from: Home  Chief Complaint: Light-headedness, palpitations  HPI: Brian Martinez is a 65 y.o. gentleman with a history of Depression, Mitral valve regurgitation, and atrial fibrillation with RVR (he is supposed to be on warfarin anticoagulation, CHADS-VASc score of 2) who presented to the ED for evaluation of light-headedness/dizziness and palpitations.  He denies chest pain or shortness of breath.  He admits to medical noncompliance with his digoxin and warfarin.  He reports signs and symptoms concerning for depression, including loss of appetite, weight loss, loss in interest in social interactions with friends, and forgetfulness. He reports the his wife died over one year ago and he retired last year.  June was rough because his wife's birthday and their wedding anniversary fall within that month.  He reports that he did well during the month of May when he stayed with family in [place] for several weeks.  He has been invited to come back but has been reluctant to go because he does not want to impose on anyone.  He denies suicidal or homicidal ideation.  ED Course: Patient has received a total of 35mg  of IV cardizem via bolus and he is now on a cardizem infusion at 7.5mg /hr.  Troponin negative.  INR normal.  Digoxin level undetectable.  Heparin infusion started per protocol.  Hospitalist asked to admit.  Review of Systems: As per HPI otherwise 10 point review of systems negative.    Past Medical History:  Diagnosis Date  . Abnormal EKG 06/10/2015  . AKI (acute kidney injury) ([place]) 12/2015  . Dehydration 12/2015  . Depression   . Heart murmur   . MVP (mitral valve prolapse) 06/10/2015  . PVC (premature ventricular contraction) 06/10/2015  . Severe mitral regurgitation 07/30/2015  . Thrombocytopenia ([place])     Past Surgical History:  Procedure Laterality  Date  . INGUINAL HERNIA REPAIR       reports that he has never smoked. He has never used smokeless tobacco. He reports that he drinks alcohol. He reports that he does not use drugs.  No children.  No Known Allergies  Family History  Problem Relation Age of Onset  . Hypertension Father   . Cancer Brother   . Hypertension Brother   . Hypertension Mother   . Parkinsonism Mother   . Hypertension Brother   . Hypertension Brother      Prior to Admission medications   Medication Sig Start Date End Date Taking? Authorizing Provider  aspirin EC 81 MG tablet Take 81 mg by mouth daily.    Historical Provider, MD  atenolol (TENORMIN) 50 MG tablet Take 1 tablet (50 mg total) by mouth 2 (two) times daily. 01/12/16   Oswald Hillock, MD  Cholecalciferol (VITAMIN D3) 2000 units TABS Take 2,000 Units by mouth daily.    Historical Provider, MD  digoxin (LANOXIN) 0.125 MG tablet Take 1 tablet (0.125 mg total) by mouth daily. 01/12/16   Oswald Hillock, MD  furosemide (LASIX) 20 MG tablet Take 20 mg by mouth daily as needed for edema. 01/27/16   Historical Provider, MD  mirtazapine (REMERON) 7.5 MG tablet Take 1 tablet (7.5 mg total) by mouth at bedtime. 01/12/16   Oswald Hillock, MD  Nutritional Supplements (CARNATION BREAKFAST ESSENTIALS) PACK Take 1 Package by mouth daily with breakfast.    Historical Provider, MD  vitamin B-12 (CYANOCOBALAMIN) 100 MCG tablet Take 1  tablet (100 mcg total) by mouth daily. 06/11/15   Dorothy Spark, MD  warfarin (COUMADIN) 5 MG tablet Take 1 tablet (5 mg total) by mouth daily at 6 PM. 01/13/16   Oswald Hillock, MD    Physical Exam: Vitals:   04/24/16 0015 04/24/16 0045 04/24/16 0115 04/24/16 0123  BP: 101/76 119/77 114/85   Pulse:   84   Resp: 20 21 16    Temp:   98.2 F (36.8 C)   TempSrc:   Oral   SpO2:   98% 98%  Weight:   55.8 kg (123 lb)   Height:   6\' 1"  (1.854 m)       Constitutional: NAD, calm, comfortable, temporal wasting, very thin, prominent  bones Vitals:   04/24/16 0015 04/24/16 0045 04/24/16 0115 04/24/16 0123  BP: 101/76 119/77 114/85   Pulse:   84   Resp: 20 21 16    Temp:   98.2 F (36.8 C)   TempSrc:   Oral   SpO2:   98% 98%  Weight:   55.8 kg (123 lb)   Height:   6\' 1"  (1.854 m)    Eyes: PERRL, lids and conjunctivae normal ENMT: Mucous membranes are moist. Posterior pharynx clear of any exudate or lesions. Normal dentition.  Neck: normal appearance, supple Respiratory: clear to auscultation bilaterally, no wheezing, no crackles. Normal respiratory effort. No accessory muscle use.  Cardiovascular: Tachycardic and irregular.  No extremity edema. 2+ pedal pulses.   GI: abdomen is soft and compressible.  No distention.  No tenderness.  No masses palpated.  Bowel sounds are present. Musculoskeletal:  No joint deformity in upper and lower extremities. Good ROM, no contractures. Normal muscle tone.  Skin: no rashes, warm and dry Neurologic: No focal deficits. Psychiatric: Alert and oriented x 3. Flat affect.    Labs on Admission: I have personally reviewed following labs and imaging studies  CBC:  Recent Labs Lab 04/23/16 2214  WBC 5.6  NEUTROABS 4.2  HGB 18.3*  HCT 51.5  MCV 93.1  PLT [id number]*   Basic Metabolic Panel:  Recent Labs Lab 04/23/16 2214  NA 139  K 4.3  CL 103  CO2 20*  GLUCOSE 80  BUN 47*  CREATININE 1.29*  CALCIUM 9.3   GFR: Estimated Creatinine Clearance: 44.5 mL/min (by C-G formula based on SCr of 1.29 mg/dL). Liver Function Tests:  Recent Labs Lab 04/23/16 2214  AST 16  ALT 12*  ALKPHOS 33*  BILITOT 1.4*  PROT 6.0*  ALBUMIN 3.6   Coagulation Profile:  Recent Labs Lab 04/23/16 2214  INR 1.14   Cardiac Enzymes:  Recent Labs Lab 04/23/16 2214  TROPONINI <0.03   CBG:  Recent Labs Lab 04/23/16 2208  GLUCAP 70    EKG: Independently reviewed. Atrial fibrillation with RVR, rate 127.   Assessment/Plan Principal Problem:   Atrial fibrillation with rapid  ventricular response ([facility]) Active Problems:   Depression   Weight loss      Atrial fibrillation with RVR due to medical noncompliance --Admit to stepdown unit due to cardizem infusion --Serial troponin --Echo in the AM --TFTs --Hope to transition to oral medication by the morning --Consider cardiology consult  Depression --Needs Behavioral Health consult prior to discharge --Support offered  Moderate to severe protein calorie malnutrition --Nutrition consult   DVT prophylaxis: FULL anticoagulation Code Status: FULL Family Communication: Patient alone at time of admission Disposition Plan: To be determined Consults called: NONE, needs BH consult placed in the AM Admission status: Observation,  stepdown   TIME SPENT: 70 minutes   Eber Jones MD [facility] Pager [phone]  If 7PM-7AM, please contact night-coverage [email or web address] Password [email or web address]  04/24/2016, 2:24 AM

## 2016-04-24 NOTE — Progress Notes (Signed)
Pts heart rate when at rest is in the 80's in AFIB.  When pt stands up it reaches a high of 140, still in AFIB.  HR settles down quickly to 80 once pt is resting again.  RN will continue to monitor.

## 2016-04-24 NOTE — Progress Notes (Signed)
Pt has had consistently elevated lactic acid around 2.5.  MD has been notified each time and no interventions were ordered.  RN added a lactic acid for am labs.

## 2016-04-24 NOTE — Progress Notes (Signed)
CRITICAL VALUE ALERT  Critical value received:  Lactic acid 2.5  Date of notification:  04/24/16  Time of notification:  0321  Critical value read back: Yes  Nurse who received alert:  Remo Lipps, RN  MD notified (1st page):  [facility] NP  Time of first page:  [phone]

## 2016-04-24 NOTE — Progress Notes (Signed)
ANTICOAGULATION CONSULT NOTE - Followup Consult  Pharmacy Consult for Heparin/Warfarin Indication: atrial fibrillation  No Known Allergies  Patient Measurements: Height: 6\' 1"  (185.4 cm) Weight: 125 lb 3.2 oz (56.8 kg) IBW/kg (Calculated) : 79.9 Vital Signs: Temp: 97.5 F (36.4 C) (08/06 0327) Temp Source: Oral (08/06 0327) BP: 81/66 (08/06 0700) Pulse Rate: 61 (08/06 0700)  Labs:  Recent Labs  04/23/16 2214 04/24/16 0221 04/24/16 0630  HGB 18.3*  --  15.2  HCT 51.5  --  44.3  PLT 135*  --  PENDING  LABPROT 14.7  --  16.1*  INR 1.14  --  1.28  HEPARINUNFRC  --   --  0.50  CREATININE 1.29*  --   --   TROPONINI <0.03 <0.03  --     Estimated Creatinine Clearance: 45.3 mL/min (by C-G formula based on SCr of 1.29 mg/dL).   Medical History: Past Medical History:  Diagnosis Date  . Abnormal EKG 06/10/2015  . AKI (acute kidney injury) ([place]) 12/2015  . Dehydration 12/2015  . Depression   . Heart murmur   . MVP (mitral valve prolapse) 06/10/2015  . PVC (premature ventricular contraction) 06/10/2015  . Severe mitral regurgitation 07/30/2015  . Thrombocytopenia [facility])     Assessment: 65 y/o M with hx of Afib and suppose to be on Coumadin PTA but not taking due to noncompliance. INR only 1.14 on admit. Currently in Afib so pharmacy to start heparin / coumadin bridge. Heparin level is therapeutic at 0.5. INR up to 1.28 today. Hgb stable and plts low at 135. No s/s of bleed.  Goal of Therapy:  Heparin level 0.3-0.7 units/ml  INR 2.0-3.0 Monitor platelets by anticoagulation protocol: Yes   Plan:  Continue heparin gtt at 800 units/hr Continue Coumadin 5mg  PO x 1 Monitor daily HL / INR, CBC, s/s of bleed  Elenor Quinones, PharmD, [facility] Clinical Pharmacist Pager [phone] 04/24/2016 7:35 AM

## 2016-04-24 NOTE — Progress Notes (Signed)
CRITICAL VALUE ALERT  Critical value received:  Lactic acid 2.4  Date of notification:  04/24/16  Time of notification:  0740  Critical value read back:yes  Nurse who received alert:  Gaspar Cola, RN  MD notified (1st page):  Thereasa Solo, J  Time of first page:  [phone]

## 2016-04-25 ENCOUNTER — Encounter (HOSPITAL_COMMUNITY): Payer: Self-pay | Admitting: Emergency Medicine

## 2016-04-25 LAB — CBC
HCT: 39.2 % (ref 39.0–52.0)
HEMOGLOBIN: 13.2 g/dL (ref 13.0–17.0)
MCH: 31.3 pg (ref 26.0–34.0)
MCHC: 33.7 g/dL (ref 30.0–36.0)
MCV: 92.9 fL (ref 78.0–100.0)
PLATELETS: 99 10*3/uL — AB (ref 150–400)
RBC: 4.22 MIL/uL (ref 4.22–5.81)
RDW: 12.7 % (ref 11.5–15.5)
WBC: 5.6 10*3/uL (ref 4.0–10.5)

## 2016-04-25 LAB — PHOSPHORUS: PHOSPHORUS: 1.3 mg/dL — AB (ref 2.5–4.6)

## 2016-04-25 LAB — COMPREHENSIVE METABOLIC PANEL
ALBUMIN: 2.7 g/dL — AB (ref 3.5–5.0)
ALT: 17 U/L (ref 17–63)
AST: 23 U/L (ref 15–41)
Alkaline Phosphatase: 42 U/L (ref 38–126)
Anion gap: 5 (ref 5–15)
BUN: 30 mg/dL — AB (ref 6–20)
CHLORIDE: 110 mmol/L (ref 101–111)
CO2: 27 mmol/L (ref 22–32)
Calcium: 8.4 mg/dL — ABNORMAL LOW (ref 8.9–10.3)
Creatinine, Ser: 0.86 mg/dL (ref 0.61–1.24)
GFR calc Af Amer: >60 mL/min (ref >60)
GFR calc non Af Amer: >60 mL/min (ref >60)
GLUCOSE: 84 mg/dL (ref 65–99)
POTASSIUM: 4.1 mmol/L (ref 3.5–5.1)
SODIUM: 142 mmol/L (ref 135–145)
Total Bilirubin: 0.8 mg/dL (ref 0.3–1.2)
Total Protein: 4.4 g/dL — ABNORMAL LOW (ref 6.5–8.1)

## 2016-04-25 LAB — PROTIME-INR
INR: 1.46
Prothrombin Time: 17.9 seconds — ABNORMAL HIGH (ref 11.4–15.2)

## 2016-04-25 LAB — FOLATE RBC
FOLATE, HEMOLYSATE: 460.2 ng/mL
FOLATE, RBC: 915 ng/mL (ref >498)
HEMATOCRIT: 50.3 % (ref 37.5–51.0)

## 2016-04-25 LAB — HEPARIN LEVEL (UNFRACTIONATED): HEPARIN UNFRACTIONATED: 0.38 [IU]/mL (ref 0.30–0.70)

## 2016-04-25 LAB — LACTIC ACID, PLASMA: Lactic Acid, Venous: 2.5 mmol/L (ref 0.5–1.9)

## 2016-04-25 MED ORDER — VITAMIN B-12 100 MCG PO TABS
100.0000 ug | ORAL_TABLET | Freq: Every day | ORAL | Status: DC
  Administered 2016-04-25 – 2016-05-02 (×8): 100 ug via ORAL
  Filled 2016-04-25 (×8): qty 1

## 2016-04-25 MED ORDER — SODIUM CHLORIDE 0.9 % IV BOLUS (SEPSIS)
500.0000 mL | Freq: Once | INTRAVENOUS | Status: AC
  Administered 2016-04-25: 500 mL via INTRAVENOUS

## 2016-04-25 MED ORDER — WARFARIN SODIUM 5 MG PO TABS
5.0000 mg | ORAL_TABLET | Freq: Once | ORAL | Status: AC
  Administered 2016-04-25: 5 mg via ORAL
  Filled 2016-04-25: qty 1

## 2016-04-25 MED ORDER — ATENOLOL 50 MG PO TABS: 50.0000 mg | ORAL_TABLET | Freq: Two times a day (BID) | ORAL | Status: DC

## 2016-04-25 MED ORDER — POTASSIUM PHOSPHATE MONOBASIC 500 MG PO TABS
500.0000 mg | ORAL_TABLET | Freq: Three times a day (TID) | ORAL | Status: DC
  Filled 2016-04-25: qty 1

## 2016-04-25 MED ORDER — SODIUM CHLORIDE 0.9 % IV BOLUS (SEPSIS): 500.0000 mL | Freq: Once | INTRAVENOUS | Status: DC

## 2016-04-25 MED ORDER — K PHOS MONO-SOD PHOS DI & MONO 155-852-130 MG PO TABS
500.0000 mg | ORAL_TABLET | Freq: Three times a day (TID) | ORAL | Status: DC
  Administered 2016-04-25 – 2016-05-01 (×19): 500 mg via ORAL
  Filled 2016-04-25 (×23): qty 2

## 2016-04-25 MED ORDER — ATENOLOL 25 MG PO TABS
50.0000 mg | ORAL_TABLET | Freq: Two times a day (BID) | ORAL | Status: DC
  Administered 2016-04-25 – 2016-05-02 (×13): 50 mg via ORAL
  Filled 2016-04-25: qty 1
  Filled 2016-04-25 (×4): qty 2
  Filled 2016-04-25: qty 1
  Filled 2016-04-25: qty 2
  Filled 2016-04-25: qty 1
  Filled 2016-04-25: qty 2
  Filled 2016-04-25 (×2): qty 1
  Filled 2016-04-25: qty 2
  Filled 2016-04-25: qty 1
  Filled 2016-04-25: qty 2
  Filled 2016-04-25: qty 1

## 2016-04-25 NOTE — Plan of Care (Signed)
Problem: Activity: Goal: Risk for activity intolerance will decrease Outcome: Progressing Pt can easily go from bed to [facility] independently but cannot ambulate further due to irregular heart rate.

## 2016-04-25 NOTE — Progress Notes (Signed)
[place] TEAM 1 - Stepdown/ICU TEAM PROGRESS NOTE  Brian Martinez [id number] DOB: 08/15/1951 DOA: 04/23/2016 PCP: REDMON,NOELLE, PA-C  Admit HPI / Brief Narrative: [name] with a history of Depression, Mitral valve regurgitation, and atrial fibrillation with RVR (he is supposed to be on warfarin anticoagulation, CHADS-VASc score of 2) who presented to the ED for evaluation of light-headedness/dizziness and palpitations.  He denied chest pain or shortness of breath.  He admitted to noncompliance with digoxin and warfarin.  He reported loss of appetite, weight loss, loss of interest in social interactions with friends, and forgetfulness. His wife died over one year ago and he retired last year.  June was rough because his wife's birthday and their wedding anniversary fall within that month.  He denied suicidal or homicidal ideation.  In the ED he was found to be in atrial fibrillation with a rapid ventricular response.  He received a total of 35mg  of IV cardizem via bolus and then a cardizem infusion at 7.5mg /hr.  Troponin negative.  INR normal.  Digoxin level undetectable.  Heparin infusion started per protocol.    HPI/Subjective: The patient is resting comfortably and states he has no problems as long as he is resting.  He continues to become significantly tachycardic when he attempts to exert himself, even when just trying to stand up to use a bedside commode.  He denies chest pain nausea vomiting or abdominal pain.  He admits to very poor appetite as of late and poor intake.  He relates that this is due to frequent recurrent thoughts of his deceased wife.  He admits that he has not been taking Remeron.  He does feel that it probably did help when he was taking it previously.  He states that he saw a psychiatrist during her recent visit but that they simply provided him with a list of people he could see as an outpatient.  He does not feel that he needs to see a psychiatrist as an inpatient but  does wish to establish ongoing care in the psychiatric office after his discharge.  Assessment/Plan:  Chronic atrial fibrillation with acute RVR Due to noncompliance with medications plus volume depletion - TSH normal - magnesium normal - CHA2DS2-VASc score is 2 - warfarin utilized due to valvular disease - continue to hydrate - attempt to wean to oral medications later today  Mitral valve prolapse w/ severe regurgitation  Prominent murmur on exam - followed by cardiology  Hypokalemia Corrected to normal range - recheck in a.m. to assure stable  Hypophosphatemia Moderate - replace with oral supplementation  Volume depletion - mild lactic acidosis Persists - continue volume resuscitation with increased IV rate today  Depression No evidence of suicidal ideation - clearly having intrusive frequent grief episodes with trigger being moments that remind him of his deceased wife - resume Remeron and encourage patient to continue this as an outpatient - discussed need to improve nutrition and maintain hydration - patient to see his PCP after discharge for referral to outpatient psychiatric office  Code Status: FULL Family Communication: no family present at time of exam Disposition Plan: SDU until rate consistently controlled off IV medication  Consultants: None  Procedures: None  Antibiotics: None  DVT prophylaxis: IV heparin > warfarin   Objective: Blood pressure 112/70, pulse 82, temperature 97.5 F (36.4 C), temperature source Oral, resp. rate 14, height 6\' 1"  (1.854 m), weight 55.9 kg (123 lb 3.2 oz), SpO2 98 %.  Intake/Output Summary (Last 24 hours) at 04/25/16  [place] filed at 04/25/16 1000  Gross per 24 hour  Intake           3831.5 ml  Output              350 ml  Net           3481.5 ml    Exam: General: No acute respiratory distress - cachectic Lungs: Clear to auscultation bilaterally without wheezes or crackles Cardiovascular: Irregularly irregular with  rate 80 bpm while at rest - prominent 3/6 holosystolic murmur Abdomen: Nontender, nondistended, soft, bowel sounds positive, no rebound, no ascites, no appreciable mass Extremities: No significant cyanosis, clubbing, or edema bilateral lower extremities   Data Reviewed:  Basic Metabolic Panel:  Recent Labs Lab 04/23/16 2214 04/24/16 0221 04/24/16 0630 04/25/16 0425  NA 139  --  138 142  K 4.3  --  3.1* 4.1  CL 103  --  102 110  CO2 20*  --  27 27  GLUCOSE 80  --  236* 84  BUN 47*  --  42* 30*  CREATININE 1.29*  --  1.08 0.86  CALCIUM 9.3  --  8.6* 8.4*  MG  --  2.1  --   --   PHOS  --   --   --  1.3*    CBC:  Recent Labs Lab 04/23/16 2214 04/24/16 0630 04/25/16 0425  WBC 5.6 5.8 5.6  NEUTROABS 4.2  --   --   HGB 18.3* 15.2 13.2  HCT 51.5 44.3 39.2  MCV 93.1 91.7 92.9  PLT 135* 117* 99*    Liver Function Tests:  Recent Labs Lab 04/23/16 2214 04/25/16 0425  AST 16 23  ALT 12* 17  ALKPHOS 33* 42  BILITOT 1.4* 0.8  PROT 6.0* 4.4*  ALBUMIN 3.6 2.7*   Coags:  Recent Labs Lab 04/23/16 2214 04/24/16 0630 04/25/16 0426  INR 1.14 1.28 1.46   Cardiac Enzymes:  Recent Labs Lab 04/23/16 2214 04/24/16 0221 04/24/16 0630 04/24/16 1420  TROPONINI <0.03 <0.03 <0.03 <0.03   CBG:  Recent Labs Lab 04/23/16 2208  GLUCAP 70    Recent Results (from the past 240 hour(s))  MRSA PCR Screening     Status: None   Collection Time: 04/24/16  2:17 AM  Result Value Ref Range Status   MRSA by PCR NEGATIVE NEGATIVE Final    Comment:        The GeneXpert MRSA Assay (FDA approved for NASAL specimens only), is one component of a comprehensive MRSA colonization surveillance program. It is not intended to diagnose MRSA infection nor to guide or monitor treatment for MRSA infections.      Studies:   Recent x-ray studies have been reviewed in detail by the Attending Physician  Scheduled Meds:  Scheduled Meds: . aspirin EC  81 mg Oral Daily  . digoxin   0.125 mg Oral Daily  . feeding supplement (ENSURE ENLIVE)  237 mL Oral BID BM  . mirtazapine  7.5 mg Oral QHS  . Warfarin - Pharmacist Dosing Inpatient   Does not apply q1800    Cherene Altes , MD   [facility] Office  [phone] Pager - Text Page per Amion as per below:  On-Call/Text Page:      [email or web address]      password [email or web address]  If 7PM-7AM, please contact night-coverage [email or web address] Password [email or web address] 04/25/2016, 11:09 AM   LOS: 1 day

## 2016-04-25 NOTE — Plan of Care (Signed)
Problem: Physical Regulation: Goal: Ability to maintain clinical measurements within normal limits will improve Outcome: Not Progressing Unable to ambulate without HR rising to 140's.

## 2016-04-25 NOTE — Progress Notes (Addendum)
Initial Nutrition Assessment  DOCUMENTATION CODES:   Severe malnutrition in context of chronic illness, Underweight  INTERVENTION:    Continue Ensure Enlive po BID, each supplement provides 350 kcal and 20 grams of protein  NUTRITION DIAGNOSIS:   Malnutrition related to chronic illness as evidenced by severe depletion of body fat, severe depletion of muscle mass  GOAL:   Patient will meet greater than or equal to 90% of their needs  MONITOR:   PO intake, Supplement acceptance, Labs, Weight trends, I & O's  REASON FOR ASSESSMENT:   Consult, Malnutrition Screening Tool Poor PO  ASSESSMENT:   [name] with a history of Depression, Mitral valve regurgitation, and atrial fibrillation with RVR (he is supposed to be on warfarin anticoagulation, CHADS-VASc score of 2) who presented to the ED for evaluation of light-headedness/dizziness and palpitations.  He denied chest pain or shortness of breath.  He admitted to noncompliance with digoxin and warfarin.  He reported loss of appetite, weight loss, loss of interest in social interactions with friends, and forgetfulness. His wife died over one year ago and he retired last year.  June was rough because his wife's birthday and their wedding anniversary fall within that month.  He denied suicidal or homicidal ideation.  Patient reports a decreased appetite PTA, however, he feels he's been eating better >> enjoys the food here. PO intake 100% per flowsheet records. Drinks [employer] powder drink mix with milk at home (220 kcals, 13 gm protein per serving). Per weight readings below, pt has had a 13% loss since April 2017 >> severe for time frame. Has Ensure Enlive ordered BID >> he is drinking. Nutrition-Focused physical exam completed. Findings are severe fat depletion, severe muscle depletion, and no edema.   Diet Order:  Diet regular Room service appropriate? Yes; Fluid consistency: Thin  Skin:  Reviewed, no  issues  Last BM:  8/3  Height:   Ht Readings from Last 1 Encounters:  04/24/16 6\' 1"  (1.854 m)    Weight:   Wt Readings from Last 1 Encounters:  04/25/16 123 lb 3.2 oz (55.9 kg)    Wt Readings from Last 20 Encounters:  04/25/16 123 lb 3.2 oz (55.9 kg)  01/12/16 141 lb 8.6 oz (64.2 kg)  12/09/15 153 lb 3.5 oz (69.5 kg)  09/04/15 157 lb (71.2 kg)  08/11/15 158 lb (71.7 kg)  06/10/15 156 lb 12.8 oz (71.1 kg)    Ideal Body Weight:  83.6 kg  BMI:  Body mass index is 16.25 kg/m.  Estimated Nutritional Needs:   Kcal:  1700-1900  Protein:  80-90 gm  Fluid:  1.7-1.9 L  EDUCATION NEEDS:   No education needs identified at this time  Arthur Holms, RD, LDN Pager #: [phone] After-Hours Pager #: [phone]

## 2016-04-25 NOTE — Progress Notes (Signed)
CRITICAL VALUE ALERT  Critical value received:  Lactic acid 2.5  Date of notification:  04/25/16  Time of notification:  0510  Critical value read back: Yes  Nurse who received alert:  Remo Lipps, RN  MD notified (1st page):  [place]  Time of first page:  [phone]

## 2016-04-25 NOTE — Progress Notes (Signed)
ANTICOAGULATION CONSULT NOTE - Followup Consult  Pharmacy Consult for Heparin/Warfarin Indication: atrial fibrillation  No Known Allergies  Patient Measurements: Height: 6\' 1"  (185.4 cm) Weight: 123 lb 3.2 oz (55.9 kg) IBW/kg (Calculated) : 79.9 Vital Signs: Temp: 97.5 F (36.4 C) (08/07 1149) Temp Source: Oral (08/07 1149) BP: 114/95 (08/07 1149) Pulse Rate: 98 (08/07 1149)  Labs:  Recent Labs  04/23/16 2214 04/24/16 0221 04/24/16 0630 04/24/16 1420 04/25/16 0425 04/25/16 0426  HGB 18.3*  --  15.2  --  13.2  --   HCT 51.5  --  44.3  --  39.2  --   PLT 135*  --  117*  --  99*  --   LABPROT 14.7  --  16.1*  --   --  17.9*  INR 1.14  --  1.28  --   --  1.46  HEPARINUNFRC  --   --  0.50  --   --  0.38  CREATININE 1.29*  --  1.08  --  0.86  --   TROPONINI <0.03 <0.03 <0.03 <0.03  --   --     Estimated Creatinine Clearance: 66.8 mL/min (by C-G formula based on SCr of 0.86 mg/dL).   Medical History: Past Medical History:  Diagnosis Date  . Abnormal EKG 06/10/2015  . AKI (acute kidney injury) ([place]) 12/2015  . Dehydration 12/2015  . Depression   . Heart murmur   . MVP (mitral valve prolapse) 06/10/2015  . PVC (premature ventricular contraction) 06/10/2015  . Severe mitral regurgitation 07/30/2015  . Thrombocytopenia [facility])     Assessment: 65 y/o M with hx of Afib and suppose to be on Coumadin PTA but not taking due to noncompliance. INR only 1.14 on admit. Currently in Afib so pharmacy to start heparin / coumadin bridge.   Heparin level remains therapeutic on 800 units/hr.  INR up to 1.46 today. CBC trending down (plts 99, chronic TTP)- will monitor. No s/s of bleed.  Goal of Therapy:  Heparin level 0.3-0.7 units/ml  INR 2.0-3.0 Monitor platelets by anticoagulation protocol: Yes   Plan:  Continue heparin gtt at 800 units/hr Continue Coumadin 5mg  PO x 1 Monitor daily HL / INR, CBC, s/s of bleed  Sloan Leiter, PharmD, BCPS Clinical  Pharmacist [phone] 04/25/2016 1:26 PM

## 2016-04-26 ENCOUNTER — Encounter (HOSPITAL_COMMUNITY): Payer: Self-pay | Admitting: General Practice

## 2016-04-26 DIAGNOSIS — E876 Hypokalemia: Secondary | ICD-10-CM

## 2016-04-26 DIAGNOSIS — Z9119 Patient's noncompliance with other medical treatment and regimen: Secondary | ICD-10-CM

## 2016-04-26 DIAGNOSIS — I341 Nonrheumatic mitral (valve) prolapse: Secondary | ICD-10-CM

## 2016-04-26 DIAGNOSIS — Z91199 Patient's noncompliance with other medical treatment and regimen due to unspecified reason: Secondary | ICD-10-CM

## 2016-04-26 DIAGNOSIS — I4891 Unspecified atrial fibrillation: Secondary | ICD-10-CM

## 2016-04-26 LAB — BASIC METABOLIC PANEL
Anion gap: 4 — ABNORMAL LOW (ref 5–15)
BUN: 23 mg/dL — AB (ref 6–20)
CO2: 25 mmol/L (ref 22–32)
Calcium: 8.1 mg/dL — ABNORMAL LOW (ref 8.9–10.3)
Chloride: 114 mmol/L — ABNORMAL HIGH (ref 101–111)
Creatinine, Ser: 0.59 mg/dL — ABNORMAL LOW (ref 0.61–1.24)
Glucose, Bld: 87 mg/dL (ref 65–99)
POTASSIUM: 3.7 mmol/L (ref 3.5–5.1)
SODIUM: 143 mmol/L (ref 135–145)

## 2016-04-26 LAB — CBC
HEMATOCRIT: 38.9 % — AB (ref 39.0–52.0)
HEMOGLOBIN: 12.9 g/dL — AB (ref 13.0–17.0)
MCH: 31.3 pg (ref 26.0–34.0)
MCHC: 33.2 g/dL (ref 30.0–36.0)
MCV: 94.4 fL (ref 78.0–100.0)
PLATELETS: 93 10*3/uL — AB (ref 150–400)
RBC: 4.12 MIL/uL — AB (ref 4.22–5.81)
RDW: 13.1 % (ref 11.5–15.5)
WBC: 4.6 10*3/uL (ref 4.0–10.5)

## 2016-04-26 LAB — HEPARIN LEVEL (UNFRACTIONATED): HEPARIN UNFRACTIONATED: 0.4 [IU]/mL (ref 0.30–0.70)

## 2016-04-26 LAB — PHOSPHORUS: PHOSPHORUS: 2.6 mg/dL (ref 2.5–4.6)

## 2016-04-26 LAB — PROTIME-INR
INR: 2.19
Prothrombin Time: 24.8 seconds — ABNORMAL HIGH (ref 11.4–15.2)

## 2016-04-26 LAB — LACTIC ACID, PLASMA: LACTIC ACID, VENOUS: 1.5 mmol/L (ref 0.5–1.9)

## 2016-04-26 LAB — MAGNESIUM: Magnesium: 1.6 mg/dL — ABNORMAL LOW (ref 1.7–2.4)

## 2016-04-26 MED ORDER — ATENOLOL 25 MG PO TABS
25.0000 mg | ORAL_TABLET | Freq: Once | ORAL | Status: AC
  Administered 2016-04-26: 25 mg via ORAL
  Filled 2016-04-26: qty 1

## 2016-04-26 MED ORDER — MAGNESIUM SULFATE 50 % IJ SOLN
3.0000 g | Freq: Once | INTRAVENOUS | Status: AC
  Administered 2016-04-26: 3 g via INTRAVENOUS
  Filled 2016-04-26: qty 6

## 2016-04-26 MED ORDER — WARFARIN SODIUM 1 MG PO TABS
1.0000 mg | ORAL_TABLET | Freq: Once | ORAL | Status: AC
  Administered 2016-04-26: 1 mg via ORAL
  Filled 2016-04-26: qty 1

## 2016-04-26 NOTE — Progress Notes (Signed)
ANTICOAGULATION CONSULT NOTE - Followup Consult  Pharmacy Consult for Heparin/Warfarin Indication: atrial fibrillation  No Known Allergies  Patient Measurements: Height: 6\' 1"  (185.4 cm) Weight: 143 lb 8 oz (65.1 kg) IBW/kg (Calculated) : 79.9 Vital Signs: Temp: 97 F (36.1 C) (08/08 0416) Temp Source: Oral (08/08 0416) BP: 135/87 (08/08 0609) Pulse Rate: 109 (08/08 0609)  Labs:  Recent Labs  04/24/16 0221 04/24/16 0630 04/24/16 1420 04/25/16 0425 04/25/16 0426 04/26/16 0512 04/26/16 0513  HGB  --  15.2  --  13.2  --  12.9*  --   HCT 50.3 44.3  --  39.2  --  38.9*  --   PLT  --  117*  --  99*  --  93*  --   LABPROT  --  16.1*  --   --  17.9* 24.8*  --   INR  --  1.28  --   --  1.46 2.19  --   HEPARINUNFRC  --  0.50  --   --  0.38  --  0.40  CREATININE  --  1.08  --  0.86  --  0.59*  --   TROPONINI <0.03 <0.03 <0.03  --   --   --   --     Estimated Creatinine Clearance: 83.6 mL/min (by C-G formula based on SCr of 0.8 mg/dL).   Medical History: Past Medical History:  Diagnosis Date  . Abnormal EKG 06/10/2015  . AKI (acute kidney injury) ([place]) 12/2015  . Dehydration 12/2015  . Depression   . Heart murmur   . MVP (mitral valve prolapse) 06/10/2015  . PVC (premature ventricular contraction) 06/10/2015  . Severe mitral regurgitation 07/30/2015  . Thrombocytopenia [facility])     Assessment: 65 y/o M with hx of Afib and suppose to be on Coumadin PTA but not taking due to noncompliance. INR only 1.14 on admit. Currently in Afib so pharmacy to start heparin / coumadin bridge.   Heparin level remains therapeutic on 800 units/hr.  INR up to 2.19 - large jump after 3 days of 5 mg (increase of 0.73 overnight).  CBC trending down (plts 93, known chronic TTP)- will monitor. No s/s of bleed.  Goal of Therapy:  Heparin level 0.3-0.7 units/ml  INR 2.0-3.0 Monitor platelets by anticoagulation protocol: Yes   Plan:  Continue heparin gtt at 800 units/hr Decrease Coumadin to 1 mg  po x1 tonight.  Monitor daily HL / INR, CBC, s/s of bleed  Sloan Leiter, PharmD, BCPS Clinical Pharmacist [phone] 04/26/2016 7:17 AM

## 2016-04-26 NOTE — Progress Notes (Signed)
PROGRESS NOTE    Brian Martinez  [id number] DOB: 01-09-1951 DOA: 04/23/2016 PCP: REDMON,NOELLE, PA-C   Brief Narrative:  65 y.o. WM PMHx Depression, Mitral valve regurgitation, Atrial Fibrillation with RVR (he is supposed to be on warfarin anticoagulation, CHADS-VASc score of 2), Severe mitral regurgitation, Thrombocytopenia   who presented to the ED for evaluation of light-headedness/dizziness and palpitations. He denied chest pain or shortness of breath. He admitted to noncompliance with digoxin and warfarin. He reported loss of appetite, weight loss, loss of interest in social interactions with friends, and forgetfulness. His wife died over one year ago and he retired last year. June was rough because his wife's birthday and their wedding anniversary fall within that month. He denied suicidal or homicidal ideation.  In the ED he was found to be in atrial fibrillation with a rapid ventricular response.  He received a total of 35mg  of IV cardizem via bolus and then a cardizem infusion at 7.5mg /hr. Troponin negative. INR normal. Digoxin level undetectable. Heparin infusion started per protocol.     Subjective: 8/8 A/O 4, states can tell when he is in A. fib with RVR because he becomes lightheaded. States lives alone and does not have family in the area. States warfarin monitored at [place]:   Principal Problem:   Atrial fibrillation with rapid ventricular response (HCC) Active Problems:   Depression   Weight loss   Chronic atrial fibrillation with acute RVR ( CHA2DS2-VASc score is 2) -Due to noncompliance with medications plus volume depletion -Currently not rate controlled after stopping Cardizem drip. - TSH normal - magnesium normal  - warfarin utilized due to valvular disease  Recent Labs Lab 04/23/16 2214 04/24/16 0630 04/25/16 0426 04/26/16 0512  INR 1.14 1.28 1.46 2.19  -increase atenolol 75 mg daily -Digoxin 0.125 mg  daily -Warfarin per pharmacy, currently therapeutic.  Mitral valve prolapse w/ severe regurgitation  -Prominent murmur on exam - followed by cardiology  Hypokalemia -Potassium goal > 4   Hypomagnesemia -Magnesium goal> 2 -Magnesium IV 3 gm  Hypophosphatemia Moderate - replace with oral supplementation  Volume depletion - mild lactic acidosis Persists - continue volume resuscitation with increased IV rate today  Depression No evidence of suicidal ideation - clearly having intrusive frequent grief episodes with trigger being moments that remind him of his deceased wife - resume Remeron and encourage patient to continue this as an outpatient - discussed need to improve nutrition and maintain hydration - patient to see his PCP after discharge for referral to outpatient psychiatric office   DVT prophylaxis: Heparin -->Warfarin Code Status: Full Family Communication: None Disposition Plan: Resolution A. fib RVR    Consultants:  None  Procedures/Significant Events:  None  Cultures None  Antimicrobials: None   Devices    LINES / TUBES:     Continuous Infusions: . sodium chloride 125 mL/hr at 04/26/16 0609  . heparin 800 Units/hr (04/26/16 0558)     Objective: Vitals:   04/26/16 0600 04/26/16 0609 04/26/16 0800 04/26/16 0840  BP:  135/87 140/77 140/77  Pulse:  (!) 109 72 77  Resp:  14 12   Temp:   97.8 F (36.6 C)   TempSrc:   Oral   SpO2:  98% 98%   Weight: 65.1 kg (143 lb 8 oz)     Height:        Intake/Output Summary (Last 24 hours) at 04/26/16 0849 Last data filed at 04/25/16 1813  Gross per 24 hour  Intake  1182 ml  Output              950 ml  Net              232 ml   Filed Weights   04/24/16 0553 04/25/16 0133 04/26/16 0600  Weight: 56.8 kg (125 lb 3.2 oz) 55.9 kg (123 lb 3.2 oz) 65.1 kg (143 lb 8 oz)    Examination:  General: A/O 4, NAD, No acute respiratory distress Eyes: negative scleral hemorrhage, negative  anisocoria, negative icterus ENT: Negative Runny nose, negative gingival bleeding, Neck:  Negative scars, masses, torticollis, lymphadenopathy, JVD Lungs: Clear to auscultation bilaterally without wheezes or crackles Cardiovascular: Irregular irregular rhythm and rate, positive grade 3/6 systolic murmur, negative gallop,  normal S1 and S2 Abdomen: negative abdominal pain, nondistended, positive soft, bowel sounds, no rebound, no ascites, no appreciable mass Extremities: No significant cyanosis, clubbing, or edema bilateral lower extremities Skin: Negative rashes, lesions, ulcers Psychiatric:  Negative depression, negative anxiety, negative fatigue, negative mania  Central nervous system:  Cranial nerves II through XII intact, tongue/uvula midline, all extremities muscle strength 5/5, sensation intact throughout, negative dysarthria, negative expressive aphasia, negative receptive aphasia.  .     Data Reviewed: Care during the described time interval was provided by me .  I have reviewed this patient's available data, including medical history, events of note, physical examination, and all test results as part of my evaluation. I have personally reviewed and interpreted all radiology studies.  CBC:  Recent Labs Lab 04/23/16 2214 04/24/16 0221 04/24/16 0630 04/25/16 0425 04/26/16 0512  WBC 5.6  --  5.8 5.6 4.6  NEUTROABS 4.2  --   --   --   --   HGB 18.3*  --  15.2 13.2 12.9*  HCT 51.5 50.3 44.3 39.2 38.9*  MCV 93.1  --  91.7 92.9 94.4  PLT 135*  --  117* 99* 93*   Basic Metabolic Panel:  Recent Labs Lab 04/23/16 2214 04/24/16 0221 04/24/16 0630 04/25/16 0425 04/26/16 0512  NA 139  --  138 142 143  K 4.3  --  3.1* 4.1 3.7  CL 103  --  102 110 114*  CO2 20*  --  27 27 25   GLUCOSE 80  --  236* 84 87  BUN 47*  --  42* 30* 23*  CREATININE 1.29*  --  1.08 0.86 0.59*  CALCIUM 9.3  --  8.6* 8.4* 8.1*  MG  --  2.1  --   --   --   PHOS  --   --   --  1.3* 2.6    GFR: Estimated Creatinine Clearance: 83.6 mL/min (by C-G formula based on SCr of 0.8 mg/dL). Liver Function Tests:  Recent Labs Lab 04/23/16 2214 04/25/16 0425  AST 16 23  ALT 12* 17  ALKPHOS 33* 42  BILITOT 1.4* 0.8  PROT 6.0* 4.4*  ALBUMIN 3.6 2.7*   No results for input(s): LIPASE, AMYLASE in the last 168 hours. No results for input(s): AMMONIA in the last 168 hours. Coagulation Profile:  Recent Labs Lab 04/23/16 2214 04/24/16 0630 04/25/16 0426 04/26/16 0512  INR 1.14 1.28 1.46 2.19   Cardiac Enzymes:  Recent Labs Lab 04/23/16 2214 04/24/16 0221 04/24/16 0630 04/24/16 1420  TROPONINI <0.03 <0.03 <0.03 <0.03   BNP (last 3 results) No results for input(s): PROBNP in the last 8760 hours. HbA1C: No results for input(s): HGBA1C in the last 72 hours. CBG:  Recent Labs Lab 04/23/16 2208  GLUCAP 70   Lipid Profile: No results for input(s): CHOL, HDL, LDLCALC, TRIG, CHOLHDL, LDLDIRECT in the last 72 hours. Thyroid Function Tests:  Recent Labs  04/24/16 0221  TSH 0.989  FREET4 0.90   Anemia Panel:  Recent Labs  04/24/16 0221  VITAMINB12 388   Urine analysis:    Component Value Date/Time   COLORURINE ORANGE (A) 01/07/2016 2232   APPEARANCEUR TURBID (A) 01/07/2016 2232   LABSPEC 1.026 01/07/2016 2232   PHURINE 5.5 01/07/2016 2232   GLUCOSEU NEGATIVE 01/07/2016 2232   HGBUR SMALL (A) 01/07/2016 2232   BILIRUBINUR SMALL (A) 01/07/2016 2232   KETONESUR 15 (A) 01/07/2016 2232   PROTEINUR 100 (A) 01/07/2016 2232   NITRITE NEGATIVE 01/07/2016 2232   LEUKOCYTESUR SMALL (A) 01/07/2016 2232   Sepsis Labs: @LABRCNTIP (procalcitonin:4,lacticidven:4)  ) Recent Results (from the past 240 hour(s))  MRSA PCR Screening     Status: None   Collection Time: 04/24/16  2:17 AM  Result Value Ref Range Status   MRSA by PCR NEGATIVE NEGATIVE Final    Comment:        The GeneXpert MRSA Assay (FDA approved for NASAL specimens only), is one component of  a comprehensive MRSA colonization surveillance program. It is not intended to diagnose MRSA infection nor to guide or monitor treatment for MRSA infections.          Radiology Studies: No results found.      Scheduled Meds: . aspirin EC  81 mg Oral Daily  . atenolol  50 mg Oral BID  . digoxin  0.125 mg Oral Daily  . feeding supplement (ENSURE ENLIVE)  237 mL Oral BID BM  . mirtazapine  7.5 mg Oral QHS  . phosphorus  500 mg Oral TID  . vitamin B-12  100 mcg Oral Daily  . warfarin  1 mg Oral ONCE-1800  . Warfarin - Pharmacist Dosing Inpatient   Does not apply q1800   Continuous Infusions: . sodium chloride 125 mL/hr at 04/26/16 0609  . heparin 800 Units/hr (04/26/16 0558)     LOS: 2 days    Time spent: 40 minutes    WOODS, Geraldo Docker, MD [facility] Pager [phone]   If 7PM-7AM, please contact night-coverage [email or web address] Password [facility] 04/26/2016, 8:49 AM

## 2016-04-26 NOTE — Evaluation (Signed)
Physical Therapy Evaluation Patient Details Name: Brian Martinez MRN: [id number] DOB: [birth year] Today's Date: 04/26/2016   History of Present Illness  In the ED he was found to be in atrial fibrillation with a rapid ventricular response.  Clinical Impression  Pt admitted with above diagnosis. Pt currently with functional limitations due to the deficits listed below (see PT Problem List). Brian Martinez presents with min instability while ambulating, requiring min guard assist for safety.  PTA pt admits he did not leave his home as he says the heat makes him nauseous.  Recently realized he was out of groceries and was going to call a friend to help him with this.  Recommending OPPT as long as pt has transportation available to get to appointments, to address balance impairments.  He presents with Bil UE and LE muscle atrophy likely due to poor intake with loss of appetite. Pt will benefit from skilled PT to increase their independence and safety with mobility to allow discharge to the venue listed below.      Follow Up Recommendations Supervision - Intermittent;Outpatient PT    Equipment Recommendations  None recommended by PT    Recommendations for Other Services OT consult     Precautions / Restrictions Precautions Precautions: Fall;Other (comment) Precaution Comments: monitor BP Restrictions Weight Bearing Restrictions: No      Mobility  Bed Mobility Overal bed mobility: Modified Independent             General bed mobility comments: Increased time  Transfers Overall transfer level: Needs assistance Equipment used: None Transfers: Sit to/from Stand Sit to Stand: Min guard         General transfer comment: Min guard for safety  Ambulation/Gait Ambulation/Gait assistance: Min guard Ambulation Distance (Feet): 300 Feet Assistive device: None Gait Pattern/deviations: Step-through pattern;Decreased stride length;Narrow base of support;Drifts right/left Gait velocity:  decreased   General Gait Details: Pt drifts left/right with head turns but does not need physical assist to steady.  HR 80-110.    Stairs            Wheelchair Mobility    Modified Rankin (Stroke Patients Only)       Balance Overall balance assessment: Needs assistance Sitting-balance support: No upper extremity supported;Feet supported Sitting balance-Leahy Scale: Good     Standing balance support: No upper extremity supported;During functional activity Standing balance-Leahy Scale: Fair Standing balance comment: Pt able to void in standing without UE support but requires min guard assist with dynamic activity                             Pertinent Vitals/Pain Pain Assessment: No/denies pain    Home Living Family/patient expects to be discharged to:: Private residence Living Arrangements: Alone Available Help at Discharge: Friend(s);Neighbor;Available PRN/intermittently Type of Home: House Home Access: Stairs to enter Entrance Stairs-Rails: Left;Right;Can reach both Entrance Stairs-Number of Steps: 4 Home Layout: One level Home Equipment: Walker - 4 wheels;Bedside commode;Shower seat - built in;Wheelchair - manual;Cane - single point      Prior Function Level of Independence: Independent         Comments: Pt reports he has been staying in his home as he feels nauseous when out in the heat and humidity in the summer.  Admits that about a week ago he realized he needed to go to the grocery and mentioned he was going to call his neighbor/friend to assist him with this.  Hand Dominance        Extremity/Trunk Assessment   Upper Extremity Assessment: Defer to OT evaluation           Lower Extremity Assessment: RLE deficits/detail;LLE deficits/detail RLE Deficits / Details: Strength grossly 4/5 with extensive atrophy LLE Deficits / Details: Strength grossly 4/5 with extensive atrophy     Communication   Communication: No difficulties   Cognition Arousal/Alertness: Awake/alert Behavior During Therapy: Flat affect Overall Cognitive Status: No family/caregiver present to determine baseline cognitive functioning Area of Impairment: Problem solving             Problem Solving: Slow processing      General Comments General comments (skin integrity, edema, etc.): BP cuff on Rt calf except when standing.  BP supine 142/85, in standing with BP cuff on arm 96/69 (question if true reading, pt not symptomatic), sitting prior to ambulation 148/107, sitting after ambulating 162/125.    Exercises Other Exercises Other Exercises: Encouraged pt to ambulate with nursing staff at least 2x/day      Assessment/Plan    PT Assessment Patient needs continued PT services  PT Diagnosis Difficulty walking   PT Problem List Decreased strength;Decreased activity tolerance;Decreased balance;Decreased knowledge of use of DME;Decreased cognition;Decreased safety awareness;Cardiopulmonary status limiting activity  PT Treatment Interventions DME instruction;Gait training;Stair training;Functional mobility training;Therapeutic activities;Therapeutic exercise;Balance training;Cognitive remediation;Patient/family education   PT Goals (Current goals can be found in the Care Plan section) Acute Rehab PT Goals Patient Stated Goal: to get back to golfing PT Goal Formulation: With patient Time For Goal Achievement: 05/10/16 Potential to Achieve Goals: Good    Frequency Min 3X/week   Barriers to discharge        Co-evaluation               End of Session Equipment Utilized During Treatment: Gait belt Activity Tolerance: Patient tolerated treatment well Patient left: in chair;with call bell/phone within reach Nurse Communication: Mobility status;Other (comment) (BP; Pt voided)         Time: [id number] PT Time Calculation (min) (ACUTE ONLY): 31 min   Charges:   PT Evaluation $PT Eval Low Complexity: 1 Procedure PT  Treatments $Gait Training: 8-22 mins   PT G Codes:        Collie Siad PT, DPT  Pager: [phone] Phone: [phone] 04/26/2016, 12:01 PM

## 2016-04-26 NOTE — Progress Notes (Signed)
OT Cancellation Note  Patient Details Name: Brian Martinez MRN: [id number] DOB: 1950/10/10   Cancelled Treatment:    Reason Eval/Treat Not Completed: Pt eating lunch.  Will try back later this pm.   Lucille Passy, OTR/L [id number]   Lucille Passy M 04/26/2016, 1:29 PM

## 2016-04-26 NOTE — Evaluation (Signed)
Occupational Therapy Evaluation Patient Details Name: Brian Martinez MRN: [id number] DOB: 05-Dec-1950 Today's Date: 04/26/2016    History of Present Illness This 65 y.o. male admitted with light headedness/dizziness and palpitations.  He admitted to not taking Warfarin and his Digoxin.  He was found to be in A-Fib with RVR and dehydrated.  PMH includes:  Depression (wife died one year ago, and pt retired),MVP, thrombocytopenia   Clinical Impression   Pt admitted with above. He demonstrates the below listed deficits and will benefit from continued OT to maximize safety and independence with BADLs. Pt presents to OT with generalized weakness and impaired balance.  It appears that he has been struggling with IADLs and structuring his day since retirement and loss of his wife and role as caregiver.   He also feels that the taks of cleaning out his townhouse to prepare to sell has been adding to his stress and depression. Discussed use of pill box and possible community resources for grocery shopping and meal services (his wife did majority of this when she was alive).  Feel he would benefit from counseling services, and possible services through [facility] or [facility] to help him set up medication management system to ensure he follows through with recommendations.  Will follow acutely.          Follow Up Recommendations  Other (comment) (may benefit from [facility] services )    Equipment Recommendations  None recommended by OT    Recommendations for Other Services       Precautions / Restrictions Precautions Precautions: Fall Precaution Comments: monitor BP      Mobility Bed Mobility Overal bed mobility: Independent                Transfers Overall transfer level: Needs assistance   Transfers: Sit to/from Stand;Squat Pivot Transfers Sit to Stand: Supervision   Squat pivot transfers: Min guard          Balance     Sitting balance-Leahy Scale: Good                                      ADL Overall ADL's : Needs assistance/impaired Eating/Feeding: Independent   Grooming: Wash/dry hands;Wash/dry face;Oral care;Brushing hair;Set up;Sitting   Upper Body Bathing: Set up;Sitting   Lower Body Bathing: Min guard;Sit to/from stand   Upper Body Dressing : Set up;Sitting   Lower Body Dressing: Min guard;Sit to/from stand   Toilet Transfer: Min guard;Ambulation;Comfort height toilet   Toileting- Clothing Manipulation and Hygiene: Min guard;Sit to/from stand       Functional mobility during ADLs: Min guard       Vision     Perception     Praxis      Pertinent Vitals/Pain Pain Assessment: No/denies pain     Hand Dominance Right   Extremity/Trunk Assessment Upper Extremity Assessment Upper Extremity Assessment: Generalized weakness   Lower Extremity Assessment Lower Extremity Assessment: Defer to PT evaluation       Communication Communication Communication: No difficulties   Cognition Arousal/Alertness: Awake/alert Behavior During Therapy: Flat affect Overall Cognitive Status: No family/caregiver present to determine baseline cognitive functioning Area of Impairment: Awareness;Memory               General Comments: Pt at times appeared to mix up details and contradict himself.  He denies not taking his Digoxin, although chart indicates this.     General Comments  Exercises       Shoulder Instructions      Home Living Family/patient expects to be discharged to:: Private residence Living Arrangements: Alone Available Help at Discharge: Friend(s);Neighbor;Available PRN/intermittently Type of Home: House Home Access: Stairs to enter [employer] of Steps: 4 Entrance Stairs-Rails: Left;Right;Can reach both Home Layout: One level     Bathroom Shower/Tub: Walk-in shower;Curtain         Home Equipment: Environmental consultant - 4 wheels;Bedside commode;Shower seat - built in;Wheelchair - manual;Cane - single point    Additional Comments: Pt reports he is unsure of plans at discharge.  He may move to [place] to be closer to family or his brother from [place] may move here.       Prior Functioning/Environment Level of Independence: Independent        Comments: Pt reports he has been struggling with IADLs for ~ 2 weeks PTA.  He had stopped driving due to dizziness, and had stopped going to grocery store.  He also reports he has stopped socializing, and has had more difficulty as he has been attempting to clean out his townhouse to sell     OT Diagnosis: Generalized weakness   OT Problem List: Decreased strength;Decreased activity tolerance;Impaired balance (sitting and/or standing);Decreased safety awareness   OT Treatment/Interventions: Self-care/ADL training;Therapeutic exercise;DME and/or AE instruction;Therapeutic activities;Patient/family education;Balance training    OT Goals(Current goals can be found in the care plan section) Acute Rehab OT Goals Patient Stated Goal: to get back into activities  OT Goal Formulation: With patient Time For Goal Achievement: 05/10/16 Potential to Achieve Goals: Good ADL Goals Pt Will Perform Grooming: with modified independence;standing Pt Will Perform Upper Body Bathing: with modified independence;sitting Pt Will Perform Lower Body Bathing: with modified independence;sit to/from stand Pt Will Perform Upper Body Dressing: with modified independence;sitting Pt Will Perform Lower Body Dressing: with modified independence;sit to/from stand Pt Will Transfer to Toilet: with modified independence;ambulating;regular height toilet Pt Will Perform Toileting - Clothing Manipulation and hygiene: with modified independence;sit to/from stand Pt Will Perform Tub/Shower Transfer: Shower transfer;ambulating;shower seat Additional ADL Goal #1: Pt will be verbalize understanding of community resources to assist with IADLs   OT Frequency: Min 2X/week   Barriers to D/C: Decreased  caregiver support          Co-evaluation              End of Session Nurse Communication: Mobility status  Activity Tolerance: Patient tolerated treatment well Patient left: in bed;with call bell/phone within reach   Time: 1749-1832 OT Time Calculation (min): 43 min Charges:  OT General Charges $OT Visit: 1 Procedure OT Evaluation $OT Eval Low Complexity: 1 Procedure OT Treatments $Therapeutic Activity: 23-37 mins G-Codes:    Yuriel Lopezmartinez M 05/12/16, 6:51 PM

## 2016-04-27 LAB — CBC
HCT: 38.2 % — ABNORMAL LOW (ref 39.0–52.0)
HEMOGLOBIN: 12.6 g/dL — AB (ref 13.0–17.0)
MCH: 31.3 pg (ref 26.0–34.0)
MCHC: 33 g/dL (ref 30.0–36.0)
MCV: 95 fL (ref 78.0–100.0)
Platelets: 99 10*3/uL — ABNORMAL LOW (ref 150–400)
RBC: 4.02 MIL/uL — ABNORMAL LOW (ref 4.22–5.81)
RDW: 13.3 % (ref 11.5–15.5)
WBC: 5.2 10*3/uL (ref 4.0–10.5)

## 2016-04-27 LAB — HEPARIN LEVEL (UNFRACTIONATED): HEPARIN UNFRACTIONATED: 0.39 [IU]/mL (ref 0.30–0.70)

## 2016-04-27 LAB — PROTIME-INR
INR: 1.95
PROTHROMBIN TIME: 22.5 s — AB (ref 11.4–15.2)

## 2016-04-27 MED ORDER — MAGNESIUM SULFATE 2 GM/50ML IV SOLN
2.0000 g | Freq: Once | INTRAVENOUS | Status: AC
Start: 2016-04-27 — End: 2016-04-27
  Administered 2016-04-27: 2 g via INTRAVENOUS
  Filled 2016-04-27: qty 50

## 2016-04-27 MED ORDER — SODIUM CHLORIDE 0.9 % IV BOLUS (SEPSIS)
500.0000 mL | Freq: Once | INTRAVENOUS | Status: AC
  Administered 2016-04-27: 500 mL via INTRAVENOUS

## 2016-04-27 MED ORDER — WARFARIN SODIUM 5 MG PO TABS
5.0000 mg | ORAL_TABLET | Freq: Once | ORAL | Status: AC
  Administered 2016-04-27: 5 mg via ORAL
  Filled 2016-04-27: qty 1

## 2016-04-27 NOTE — Progress Notes (Signed)
[facility] TEAM 1 - Stepdown/ICU TEAM PROGRESS NOTE  Brian Martinez [id number] DOB: May 21, 1951 DOA: 04/23/2016 PCP: REDMON,NOELLE, PA-C  Admit HPI / Brief Narrative: [name] with a history of Depression, Mitral valve regurgitation, and atrial fibrillation with RVR (he is supposed to be on warfarin anticoagulation, CHADS-VASc score of 2) who presented to the ED for evaluation of light-headedness/dizziness and palpitations.  He denied chest pain or shortness of breath.  He admitted to noncompliance with digoxin and warfarin.  He reported loss of appetite, weight loss, loss of interest in social interactions with friends, and forgetfulness. His wife died over one year ago and he retired last year.  June was rough because his wife's birthday and their wedding anniversary fall within that month.  He denied suicidal or homicidal ideation.  In the ED he was found to be in atrial fibrillation with a rapid ventricular response.  He received a total of 35mg  of IV cardizem via bolus and then a cardizem infusion at 7.5mg /hr.  Troponin negative.  INR normal.  Digoxin level undetectable.  Heparin infusion started per protocol.    HPI/Subjective: The patient has noticed swelling in his feet and lower extremities this afternoon.  He denies erythema or pain in the legs.  He denies chest pain shortness breath fevers chills nausea or vomiting.  He is being transitioned from heparin to Coumadin but his Coumadin has not yet been consistently therapeutic for 36-48 hours and therefore we will continue IV heparin for now.  I suspect his INR will be back in the therapeutic range tomorrow and I will then feel comfortable stopping his heparin.  Assessment/Plan:  Chronic atrial fibrillation with acute RVR Due to noncompliance with medications plus volume depletion - TSH normal - magnesium normal - CHA2DS2-VASc score is 2 - warfarin utilized due to valvular disease - now well controlled with volume resuscitation - plan  to transition off heparin tomorrow morning if INR 2.0 or greater  Mitral valve prolapse w/ severe regurgitation  Prominent murmur on exam - followed by Cardiology as an outpatient  Hypokalemia Corrected to normal range - due to a very poor nutritional state  Hypophosphatemia Moderate - replaced with oral supplementation - due to very poor nutritional state  Volume depletion - mild lactic acidosis Corrected with volume resuscitation  Depression No evidence of suicidal ideation - clearly having intrusive frequent grief episodes with trigger being moments that remind him of his deceased wife - resumed Remeron and encouraged patient to continue this as an outpatient on a regular daily schedule - discussed need to improve nutrition and maintain hydration - patient to see his PCP after discharge for referral to outpatient psychiatric office  Code Status: FULL Family Communication: no family present at time of exam Disposition Plan: Probable discharge home in a.m. if INR at goal and heparin able to be discontinued - arranging for outpatient physical therapy per CM  Consultants: None  Procedures: None  Antibiotics: None  DVT prophylaxis: IV heparin > warfarin   Objective: Blood pressure 107/67, pulse 85, temperature 97.6 F (36.4 C), temperature source Oral, resp. rate 15, height 6\' 1"  (1.854 m), weight 68.4 kg (150 lb 12.8 oz), SpO2 98 %.  Intake/Output Summary (Last 24 hours) at 04/27/16 1435 Last data filed at 04/27/16 1335  Gross per 24 hour  Intake             6428 ml  Output             1850 ml  Net             4578 ml    Exam: General: No acute respiratory distress - cachectic - alert and oriented Lungs: Clear to auscultation bilaterally Cardiovascular: Irregularly irregular with controlled rate - prominent 3/6 holosystolic murmur Abdomen: Nontender, nondistended, soft, bowel sounds positive, no rebound Extremities: No significant cyanosis, or clubbing - 2+ bilateral  lower extremity edema to the knees   Data Reviewed:  Basic Metabolic Panel:  Recent Labs Lab 04/23/16 2214 04/24/16 0221 04/24/16 0630 04/25/16 0425 04/26/16 0512  NA 139  --  138 142 143  K 4.3  --  3.1* 4.1 3.7  CL 103  --  102 110 114*  CO2 20*  --  27 27 25   GLUCOSE 80  --  236* 84 87  BUN 47*  --  42* 30* 23*  CREATININE 1.29*  --  1.08 0.86 0.59*  CALCIUM 9.3  --  8.6* 8.4* 8.1*  MG  --  2.1  --   --  1.6*  PHOS  --   --   --  1.3* 2.6    CBC:  Recent Labs Lab 04/23/16 2214 04/24/16 0221 04/24/16 0630 04/25/16 0425 04/26/16 0512 04/27/16 0358  WBC 5.6  --  5.8 5.6 4.6 5.2  NEUTROABS 4.2  --   --   --   --   --   HGB 18.3*  --  15.2 13.2 12.9* 12.6*  HCT 51.5 50.3 44.3 39.2 38.9* 38.2*  MCV 93.1  --  91.7 92.9 94.4 95.0  PLT 135*  --  117* 99* 93* 99*    Liver Function Tests:  Recent Labs Lab 04/23/16 2214 04/25/16 0425  AST 16 23  ALT 12* 17  ALKPHOS 33* 42  BILITOT 1.4* 0.8  PROT 6.0* 4.4*  ALBUMIN 3.6 2.7*   Coags:  Recent Labs Lab 04/23/16 2214 04/24/16 0630 04/25/16 0426 04/26/16 0512 04/27/16 0358  INR 1.14 1.28 1.46 2.19 1.95   Cardiac Enzymes:  Recent Labs Lab 04/23/16 2214 04/24/16 0221 04/24/16 0630 04/24/16 1420  TROPONINI <0.03 <0.03 <0.03 <0.03   CBG:  Recent Labs Lab 04/23/16 2208  GLUCAP 70    Recent Results (from the past 240 hour(s))  MRSA PCR Screening     Status: None   Collection Time: 04/24/16  2:17 AM  Result Value Ref Range Status   MRSA by PCR NEGATIVE NEGATIVE Final    Comment:        The GeneXpert MRSA Assay (FDA approved for NASAL specimens only), is one component of a comprehensive MRSA colonization surveillance program. It is not intended to diagnose MRSA infection nor to guide or monitor treatment for MRSA infections.      Studies:   Recent x-ray studies have been reviewed in detail by the Attending Physician  Scheduled Meds:  Scheduled Meds: . aspirin EC  81 mg Oral Daily   . atenolol  50 mg Oral BID  . digoxin  0.125 mg Oral Daily  . feeding supplement (ENSURE ENLIVE)  237 mL Oral BID BM  . mirtazapine  7.5 mg Oral QHS  . phosphorus  500 mg Oral TID  . vitamin B-12  100 mcg Oral Daily  . warfarin  5 mg Oral ONCE-1800  . Warfarin - Pharmacist Dosing Inpatient   Does not apply q1800    Cherene Altes , MD   [facility] Office  [phone] Pager - Text Page per Amion as per below:  On-Call/Text Page:  [email or web address]      password [email or web address]  If 7PM-7AM, please contact night-coverage [email or web address] Password [email or web address] 04/27/2016, 2:35 PM   LOS: 3 days

## 2016-04-27 NOTE — Care Management Important Message (Signed)
Important Message  Patient Details  Name: Brian Martinez MRN: [id number] Date of Birth: 08-Dec-1950   Medicare Important Message Given:  Yes    Loann Quill 04/27/2016, 2:48 PM

## 2016-04-27 NOTE — Care Management Important Message (Signed)
Important Message  Patient Details  Name: Brian Martinez MRN: [id number] Date of Birth: 15-Oct-1950   Medicare Important Message Given:  Yes    Aquinnah Devin, Kym Groom, RN 04/27/2016, 11:38 AM

## 2016-04-27 NOTE — Progress Notes (Signed)
Occupational Therapy Treatment Patient Details Name: Brian Martinez MRN: [id number] DOB: Jun 22, 1951 Today's Date: 04/27/2016    History of present illness This 65 y.o. male admitted with light headedness/dizziness and palpitations.  He admitted to not taking Warfarin and his Digoxin.  He was found to be in A-Fib with RVR and dehydrated.  PMH includes:  Depression (wife died one year ago, and pt retired),MVP, thrombocytopenia   OT comments  Pt improved today. He is able to perform ADLs at supervision - modified independent level.  He is eager to discharge and reports he will have good family and friend support at discharge.   Follow Up Recommendations  No OT follow up    Equipment Recommendations  None recommended by OT    Recommendations for Other Services      Precautions / Restrictions Precautions Precautions: Fall Precaution Comments: monitor BP       Mobility Bed Mobility Overal bed mobility: Independent                Transfers Overall transfer level: Modified independent                    Balance             Standing balance-Leahy Scale: Good                     ADL                                         General ADL Comments: He is able to perform ADLs at supervision level.  He plans to use medication boxes at discharge, and discussed option for meal prep and meals      Vision                     Perception     Praxis      Cognition   Behavior During Therapy: Flat affect Overall Cognitive Status: No family/caregiver present to determine baseline cognitive functioning                  General Comments: Pt more focused today.  Is able to verbalize his plan for discharge     Extremity/Trunk Assessment               Exercises     Shoulder Instructions       General Comments      Pertinent Vitals/ Pain       Pain Assessment: No/denies pain  Home Living                                           Prior Functioning/Environment              Frequency Min 2X/week     Progress Toward Goals  OT Goals(current goals can now be found in the care plan section)  Progress towards OT goals: Progressing toward goals  ADL Goals Pt Will Perform Grooming: with modified independence;standing Pt Will Perform Upper Body Bathing: with modified independence;sitting Pt Will Perform Lower Body Bathing: with modified independence;sit to/from stand Pt Will Perform Upper Body Dressing: with modified independence;sitting Pt Will Perform Lower Body Dressing: with modified independence;sit to/from stand Pt Will Transfer to Toilet: with modified independence;ambulating;regular height  toilet Pt Will Perform Toileting - Clothing Manipulation and hygiene: with modified independence;sit to/from stand Pt Will Perform Tub/Shower Transfer: Shower transfer;ambulating;shower seat Additional ADL Goal #1: Pt will be verbalize understanding of community resources to assist with IADLs   Plan Discharge plan remains appropriate    Co-evaluation                 End of Session     Activity Tolerance Patient tolerated treatment well   Patient Left in bed;with call bell/phone within reach   Nurse Communication Mobility status        Time: [id number] OT Time Calculation (min): 43 min  Charges: OT General Charges $OT Visit: 1 Procedure OT Treatments $Therapeutic Activity: 38-52 mins  Primus Gritton M 04/27/2016, 5:49 PM

## 2016-04-27 NOTE — Care Management Note (Signed)
Case Management Note  Patient Details  Name: Brian Martinez MRN: [id number] Date of Birth: 02-05-1951  Subjective/Objective:  PT recommends outpatient PT and pt agrees, chooses [facility] because of proximity to his home.  Referral faxed, they will call pt to schedule appointment.                             Expected Discharge Plan:  Home/Self Care  Discharge planning Services  CM Consult  Status of Service:  Completed, signed off  Girard Cooter, [place] 04/27/2016, 2:46 PM

## 2016-04-27 NOTE — Progress Notes (Signed)
CSW received consult regarding placement. Patient does not have skillable needs and does not meet criteria for placement.   CSW signing off.  Percell Locus Emillia Weatherly LCSWA [phone]

## 2016-04-27 NOTE — Progress Notes (Signed)
ANTICOAGULATION CONSULT NOTE - Follow up Consult  Pharmacy Consult:  Heparin/Warfarin Indication: atrial fibrillation  No Known Allergies  Patient Measurements: Height: 6\' 1"  (185.4 cm) Weight: 150 lb 12.8 oz (68.4 kg) IBW/kg (Calculated) : 79.9  Heparin dosing weight = 68 kg  Vital Signs: Temp: 97.6 F (36.4 C) (08/09 1122) Temp Source: Oral (08/09 1122) BP: 107/67 (08/09 1122) Pulse Rate: 85 (08/09 1122)  Labs:  Recent Labs  04/24/16 1420  04/25/16 0425 04/25/16 0426 04/26/16 0512 04/26/16 0513 04/27/16 0356 04/27/16 0358  HGB  --   < > 13.2  --  12.9*  --   --  12.6*  HCT  --   --  39.2  --  38.9*  --   --  38.2*  PLT  --   --  99*  --  93*  --   --  99*  LABPROT  --   --   --  17.9* 24.8*  --   --  22.5*  INR  --   --   --  1.46 2.19  --   --  1.95  HEPARINUNFRC  --   --   --  0.38  --  0.40 0.39  --   CREATININE  --   --  0.86  --  0.59*  --   --   --   TROPONINI <0.03  --   --   --   --   --   --   --   < > = values in this interval not displayed.  Estimated Creatinine Clearance: 87.9 mL/min (by C-G formula based on SCr of 0.8 mg/dL).    Assessment: 86 YOM with history of Afib and noncompliance to continue on IV heparin bridge while INR is sub-therapeutic on Coumadin.  Heparin level is therapeutic; no bleeding reported.   Goal of Therapy:  Heparin level 0.3-0.7 units/ml  INR 2.0-3.0 Monitor platelets by anticoagulation protocol: Yes    Plan:   - Coumadin 5mg  PO today - Continue heparin gtt at 800 units/hr - Daily HL / CBC / PT / INR   Neena Beecham D. Mina Marble, PharmD, BCPS Pager:  [phone] 04/27/2016, 12:49 PM

## 2016-04-28 DIAGNOSIS — F329 Major depressive disorder, single episode, unspecified: Secondary | ICD-10-CM

## 2016-04-28 DIAGNOSIS — I482 Chronic atrial fibrillation, unspecified: Secondary | ICD-10-CM

## 2016-04-28 LAB — BASIC METABOLIC PANEL
ANION GAP: 4 — AB (ref 5–15)
BUN: 14 mg/dL (ref 6–20)
CHLORIDE: 106 mmol/L (ref 101–111)
CO2: 30 mmol/L (ref 22–32)
Calcium: 8.1 mg/dL — ABNORMAL LOW (ref 8.9–10.3)
Creatinine, Ser: 0.69 mg/dL (ref 0.61–1.24)
GFR calc Af Amer: >60 mL/min (ref >60)
GLUCOSE: 92 mg/dL (ref 65–99)
POTASSIUM: 3.8 mmol/L (ref 3.5–5.1)
Sodium: 140 mmol/L (ref 135–145)

## 2016-04-28 LAB — CBC
HEMATOCRIT: 38.4 % — AB (ref 39.0–52.0)
HEMOGLOBIN: 12.8 g/dL — AB (ref 13.0–17.0)
MCH: 31.4 pg (ref 26.0–34.0)
MCHC: 33.3 g/dL (ref 30.0–36.0)
MCV: 94.3 fL (ref 78.0–100.0)
Platelets: 109 10*3/uL — ABNORMAL LOW (ref 150–400)
RBC: 4.07 MIL/uL — AB (ref 4.22–5.81)
RDW: 13.2 % (ref 11.5–15.5)
WBC: 4.1 10*3/uL (ref 4.0–10.5)

## 2016-04-28 LAB — PROTIME-INR
INR: 1.65
PROTHROMBIN TIME: 19.7 s — AB (ref 11.4–15.2)

## 2016-04-28 LAB — MAGNESIUM: Magnesium: 2 mg/dL (ref 1.7–2.4)

## 2016-04-28 LAB — HEPARIN LEVEL (UNFRACTIONATED)
HEPARIN UNFRACTIONATED: 0.29 [IU]/mL — AB (ref 0.30–0.70)
Heparin Unfractionated: 0.44 IU/mL (ref 0.30–0.70)

## 2016-04-28 LAB — PHOSPHORUS: PHOSPHORUS: 3.5 mg/dL (ref 2.5–4.6)

## 2016-04-28 LAB — DIGOXIN LEVEL: Digoxin Level: 0.5 ng/mL — ABNORMAL LOW (ref 0.8–2.0)

## 2016-04-28 MED ORDER — OFF THE BEAT BOOK
Freq: Once | Status: AC
  Administered 2016-04-28: 1
  Filled 2016-04-28: qty 1

## 2016-04-28 MED ORDER — WARFARIN SODIUM 5 MG PO TABS
5.0000 mg | ORAL_TABLET | Freq: Once | ORAL | Status: AC
  Administered 2016-04-28: 5 mg via ORAL
  Filled 2016-04-28: qty 1

## 2016-04-28 NOTE — Progress Notes (Signed)
PROGRESS NOTE    Brian Martinez  [id number] DOB: [birth year] DOA: 04/23/2016 PCP: REDMON,NOELLE, PA-C   Brief Narrative:  65 y.o. WM PMHx Depression, Mitral valve regurgitation, Atrial Fibrillation with RVR (he is supposed to be on warfarin anticoagulation, CHADS-VASc score of 2), Severe mitral regurgitation, Thrombocytopenia   who presented to the ED for evaluation of light-headedness/dizziness and palpitations. He denied chest pain or shortness of breath. He admitted to noncompliance with digoxin and warfarin. He reported loss of appetite, weight loss, loss of interest in social interactions with friends, and forgetfulness. His wife died over one year ago and he retired last year. June was rough because his wife's birthday and their wedding anniversary fall within that month. He denied suicidal or homicidal ideation.  In the ED he was found to be in atrial fibrillation with a rapid ventricular response.  He received a total of 35mg  of IV cardizem via bolus and then a cardizem infusion at 7.5mg /hr. Troponin negative. INR normal. Digoxin level undetectable. Heparin infusion started per protocol.     Subjective: 8/10 A/O 4, states can tell when he is in A. fib with RVR because he becomes lightheaded. States lives alone and does not have family in the area. States warfarin monitored at [facility]:   Principal Problem:   Atrial fibrillation with rapid ventricular response (HCC) Active Problems:   Depression   Weight loss   Medically noncompliant   Hypomagnesemia   Hypokalemia   Chronic atrial fibrillation with acute RVR ( CHA2DS2-VASc score is 2) -Due to noncompliance with medications plus volume depletion -Currently not rate controlled after stopping Cardizem drip. - TSH normal  - warfarin utilized due to valvular disease  Recent Labs Lab 04/24/16 0630 04/25/16 0426 04/26/16 0512 04/27/16 0358 04/28/16 0400  INR 1.28 1.46 2.19 1.95 1.65    -Atenolol 50 mg BID -Digoxin 0.125 mg daily -Warfarin per pharmacy, currently not therapeutic.  Mitral valve prolapse w/ severe regurgitation  -Prominent murmur on exam - followed by cardiology  Hypokalemia -Potassium goal > 4   Hypomagnesemia -Magnesium goal> 2  Hypophosphatemia -Resolved   Volume depletion - mild lactic acidosis -Resolved  Depression No evidence of suicidal ideation - clearly having intrusive frequent grief episodes with trigger being moments that remind him of his deceased wife - resume Remeron and encourage patient to continue this as an outpatient - discussed need to improve nutrition and maintain hydration - patient to see his PCP after discharge for referral to outpatient psychiatric office   DVT prophylaxis: Heparin -->Warfarin Code Status: Full Family Communication: None Disposition Plan: Resolution A. fib RVR    Consultants:  None  Procedures/Significant Events:  None  Cultures None  Antimicrobials: None   Devices    LINES / TUBES:     Continuous Infusions: . sodium chloride 10 mL/hr at 04/27/16 1543  . heparin 900 Units/hr (04/28/16 0445)     Objective: Vitals:   04/28/16 0500 04/28/16 0600 04/28/16 0647 04/28/16 0811  BP: 105/78 109/78  (!) 128/92  Pulse: 76 77  (!) 59  Resp: 17 15  17   Temp:    97.9 F (36.6 C)  TempSrc:    Oral  SpO2: 98% 97%  97%  Weight:   68 kg (149 lb 14.6 oz)   Height:        Intake/Output Summary (Last 24 hours) at 04/28/16 0839 Last data filed at 04/28/16 0651  Gross per 24 hour  Intake  [id number] ml  Output             1825 ml  Net           839.82 ml   Filed Weights   04/26/16 0600 04/27/16 0558 04/28/16 0647  Weight: 65.1 kg (143 lb 8 oz) 68.4 kg (150 lb 12.8 oz) 68 kg (149 lb 14.6 oz)    Examination:  General: A/O 4, NAD, No acute respiratory distress Eyes: negative scleral hemorrhage, negative anisocoria, negative icterus ENT: Negative Runny nose, negative  gingival bleeding, Neck:  Negative scars, masses, torticollis, lymphadenopathy, JVD Lungs: Clear to auscultation bilaterally without wheezes or crackles Cardiovascular: Irregular irregular rhythm and rate, positive grade 3/6 systolic murmur, negative gallop,  normal S1 and S2 Abdomen: negative abdominal pain, nondistended, positive soft, bowel sounds, no rebound, no ascites, no appreciable mass Extremities: No significant cyanosis, clubbing, or edema bilateral lower extremities Skin: Negative rashes, lesions, ulcers Psychiatric:  Negative depression, negative anxiety, negative fatigue, negative mania  Central nervous system:  Cranial nerves II through XII intact, tongue/uvula midline, all extremities muscle strength 5/5, sensation intact throughout, negative dysarthria, negative expressive aphasia, negative receptive aphasia.  .     Data Reviewed: Care during the described time interval was provided by me .  I have reviewed this patient's available data, including medical history, events of note, physical examination, and all test results as part of my evaluation. I have personally reviewed and interpreted all radiology studies.  CBC:  Recent Labs Lab 04/23/16 2214  04/24/16 0630 04/25/16 0425 04/26/16 0512 04/27/16 0358 04/28/16 0345  WBC 5.6  --  5.8 5.6 4.6 5.2 4.1  NEUTROABS 4.2  --   --   --   --   --   --   HGB 18.3*  --  15.2 13.2 12.9* 12.6* 12.8*  HCT 51.5  < > 44.3 39.2 38.9* 38.2* 38.4*  MCV 93.1  --  91.7 92.9 94.4 95.0 94.3  PLT 135*  --  117* 99* 93* 99* 109*  < > = values in this interval not displayed. Basic Metabolic Panel:  Recent Labs Lab 04/23/16 2214 04/24/16 0221 04/24/16 0630 04/25/16 0425 04/26/16 0512 04/28/16 0345  NA 139  --  138 142 143 140  K 4.3  --  3.1* 4.1 3.7 3.8  CL 103  --  102 110 114* 106  CO2 20*  --  27 27 25 30   GLUCOSE 80  --  236* 84 87 92  BUN 47*  --  42* 30* 23* 14  CREATININE 1.29*  --  1.08 0.86 0.59* 0.69  CALCIUM 9.3   --  8.6* 8.4* 8.1* 8.1*  MG  --  2.1  --   --  1.6* 2.0  PHOS  --   --   --  1.3* 2.6 3.5   GFR: Estimated Creatinine Clearance: 87.4 mL/min (by C-G formula based on SCr of 0.8 mg/dL). Liver Function Tests:  Recent Labs Lab 04/23/16 2214 04/25/16 0425  AST 16 23  ALT 12* 17  ALKPHOS 33* 42  BILITOT 1.4* 0.8  PROT 6.0* 4.4*  ALBUMIN 3.6 2.7*   No results for input(s): LIPASE, AMYLASE in the last 168 hours. No results for input(s): AMMONIA in the last 168 hours. Coagulation Profile:  Recent Labs Lab 04/24/16 0630 04/25/16 0426 04/26/16 0512 04/27/16 0358 04/28/16 0400  INR 1.28 1.46 2.19 1.95 1.65   Cardiac Enzymes:  Recent Labs Lab 04/23/16 2214 04/24/16 0221 04/24/16 0630 04/24/16 1420  TROPONINI <  0.03 <0.03 <0.03 <0.03   BNP (last 3 results) No results for input(s): PROBNP in the last 8760 hours. HbA1C: No results for input(s): HGBA1C in the last 72 hours. CBG:  Recent Labs Lab 04/23/16 2208  GLUCAP 70   Lipid Profile: No results for input(s): CHOL, HDL, LDLCALC, TRIG, CHOLHDL, LDLDIRECT in the last 72 hours. Thyroid Function Tests: No results for input(s): TSH, T4TOTAL, FREET4, T3FREE, THYROIDAB in the last 72 hours. Anemia Panel: No results for input(s): VITAMINB12, FOLATE, FERRITIN, TIBC, IRON, RETICCTPCT in the last 72 hours. Urine analysis:    Component Value Date/Time   COLORURINE ORANGE (A) 01/07/2016 2232   APPEARANCEUR TURBID (A) 01/07/2016 2232   LABSPEC 1.026 01/07/2016 2232   PHURINE 5.5 01/07/2016 2232   GLUCOSEU NEGATIVE 01/07/2016 2232   HGBUR SMALL (A) 01/07/2016 2232   BILIRUBINUR SMALL (A) 01/07/2016 2232   KETONESUR 15 (A) 01/07/2016 2232   PROTEINUR 100 (A) 01/07/2016 2232   NITRITE NEGATIVE 01/07/2016 2232   LEUKOCYTESUR SMALL (A) 01/07/2016 2232   Sepsis Labs: @LABRCNTIP (procalcitonin:4,lacticidven:4)  ) Recent Results (from the past 240 hour(s))  MRSA PCR Screening     Status: None   Collection Time: 04/24/16   2:17 AM  Result Value Ref Range Status   MRSA by PCR NEGATIVE NEGATIVE Final    Comment:        The GeneXpert MRSA Assay (FDA approved for NASAL specimens only), is one component of a comprehensive MRSA colonization surveillance program. It is not intended to diagnose MRSA infection nor to guide or monitor treatment for MRSA infections.          Radiology Studies: No results found.      Scheduled Meds: . aspirin EC  81 mg Oral Daily  . atenolol  50 mg Oral BID  . digoxin  0.125 mg Oral Daily  . feeding supplement (ENSURE ENLIVE)  237 mL Oral BID BM  . mirtazapine  7.5 mg Oral QHS  . phosphorus  500 mg Oral TID  . vitamin B-12  100 mcg Oral Daily  . warfarin  5 mg Oral ONCE-1800  . Warfarin - Pharmacist Dosing Inpatient   Does not apply q1800   Continuous Infusions: . sodium chloride 10 mL/hr at 04/27/16 1543  . heparin 900 Units/hr (04/28/16 0445)     LOS: 4 days    Time spent: 40 minutes    WOODS, Geraldo Docker, MD [facility] Pager [phone]   If 7PM-7AM, please contact night-coverage [email or web address] Password [facility] 04/28/2016, 8:39 AM

## 2016-04-28 NOTE — Progress Notes (Signed)
Physical Therapy Treatment Patient  Name: Brian Martinez MRN: [id number] DOB: 02/23/1951 Today's Date: 04/28/2016    History of Present Illness This 65 y.o. male admitted with light headedness/dizziness and palpitations.  He admitted to not taking Warfarin and his Digoxin.  He was found to be in A-Fib with RVR and dehydrated.  PMH includes:  Depression (wife died one year ago, and pt retired),MVP, thrombocytopenia    PT Comments    Patient generally feeling better. Tolerated session of gait training and balance exercises without difficulty. On room air throughout with SaO2 >91%.   Follow Up Recommendations  Supervision - Intermittent;Outpatient PT     Equipment Recommendations  None recommended by PT    Recommendations for Other Services       Precautions / Restrictions Precautions Precautions: Fall    Mobility  Bed Mobility Overal bed mobility: Independent                Transfers Overall transfer level: Modified independent                  Ambulation/Gait Ambulation/Gait assistance: Min guard Ambulation Distance (Feet): 250 Feet Assistive device: None Gait Pattern/deviations: Step-through pattern;Decreased stride length;Drifts right/left Gait velocity: decr, able to incr to [facility] with cues Gait velocity interpretation: Below normal speed for age/gender General Gait Details: mild drift that pt independently corrects; reporting "tight calves" and this contributes to shorter steps;    Stairs            Wheelchair Mobility    Modified Rankin (Stroke Patients Only)       Balance Overall balance assessment: Needs assistance         Standing balance support: No upper extremity supported Standing balance-Leahy Scale: Good Standing balance comment: Able to reach 10-12 inches forward and return to upright; turns 360 in <4 seconds rt and lt     Tandem Stance - Right Leg: 30   Rhomberg - Eyes Opened: 30 Rhomberg - Eyes Closed: 30 High level  balance activites: Head turns;Turns;Direction changes;Sudden stops      Cognition Arousal/Alertness: Awake/alert Behavior During Therapy: Flat affect Overall Cognitive Status: Within Functional Limits for tasks assessed                      Exercises General Exercises - Lower Extremity Ankle Circles/Pumps: AROM;Both;10 reps;Seated Toe Raises: AAROM;Both;5 reps;Standing Heel Raises: AAROM;Both;5 reps;Standing Other Exercises Other Exercises: Pt reports he has walked in halls 3 times since last PT visit Other Exercises: sit to stand x 5 in 25.1 sec (normal for men 83-69 yo is 8.4 sec); able to stand without use of arms with incr effort    General Comments        Pertinent Vitals/Pain Pain Assessment: No/denies pain    Home Living                      Prior Function            PT Goals (current goals can now be found in the care plan section) Acute Rehab PT Goals Patient Stated Goal: to get back into activities  Time For Goal Achievement: 05/10/16 Progress towards PT goals: Progressing toward goals    Frequency  Min 3X/week    PT Plan Current plan remains appropriate    Co-evaluation             End of Session Equipment Utilized During Treatment: Gait belt Activity Tolerance: Patient tolerated treatment well Patient  left: in chair;with call bell/phone within reach     Time: 1129-1156 PT Time Calculation (min) (ACUTE ONLY): 27 min  Charges:  $Therapeutic Exercise: 8-22 mins $Therapeutic Activity: 8-22 mins                    G Codes:      Brian Martinez May 23, 2016, 12:47 PM Pager [phone]

## 2016-04-28 NOTE — Progress Notes (Signed)
ANTICOAGULATION CONSULT NOTE - Follow up Consult  Pharmacy Consult:  Heparin/Warfarin Indication: atrial fibrillation  No Known Allergies  Patient Measurements: Height: 6\' 1"  (185.4 cm) Weight: 150 lb 12.8 oz (68.4 kg) IBW/kg (Calculated) : 79.9  Heparin dosing weight = 68 kg  Vital Signs: Temp: 98.2 F (36.8 C) (08/10 0349) Temp Source: Oral (08/10 0349) BP: 116/83 (08/10 0349) Pulse Rate: 72 (08/10 0349)  Labs:  Recent Labs  04/26/16 0512 04/26/16 0513 04/27/16 0356 04/27/16 0358 04/28/16 0400  HGB 12.9*  --   --  12.6*  --   HCT 38.9*  --   --  38.2*  --   PLT 93*  --   --  99*  --   LABPROT 24.8*  --   --  22.5* 19.7*  INR 2.19  --   --  1.95 1.65  HEPARINUNFRC  --  0.40 0.39  --  0.29*  CREATININE 0.59*  --   --   --   --     Estimated Creatinine Clearance: 87.9 mL/min (by C-G formula based on SCr of 0.8 mg/dL).    Assessment: 62 YOM with history of Afib and noncompliance to continue on IV heparin bridge while INR is subtherapeutic on Coumadin. INR trending down to 1.65 - likely due to 1mg  given on 8/8 after large increase in INR. Heparin level slightly subtherapeutic. No issues with line reported per RN. Small amount of bleeding from IV site - RN will let us know if it gets worse.  Goal of Therapy:  Heparin level 0.3-0.7 units/ml  INR 2-3 Monitor platelets by anticoagulation protocol: Yes    Plan:   - Coumadin 5mg  PO again today - Increase heparin gtt to 900 units/hr - Heparin level in 6 hours - F/u IV site bleeding  Sherlon Handing, PharmD, BCPS Clinical pharmacist, pager [phone] 04/28/2016, 4:37 AM

## 2016-04-28 NOTE — Progress Notes (Signed)
ANTICOAGULATION CONSULT NOTE - Follow up Consult  Pharmacy Consult:  Heparin/Warfarin Indication: atrial fibrillation  No Known Allergies  Patient Measurements: Height: 6\' 1"  (185.4 cm) Weight: 149 lb 14.6 oz (68 kg) IBW/kg (Calculated) : 79.9  Heparin dosing weight = 68 kg  Vital Signs: Temp: 97.7 F (36.5 C) (08/10 1100) Temp Source: Oral (08/10 1100) BP: 113/72 (08/10 1100) Pulse Rate: 75 (08/10 1100)  Labs:  Recent Labs  04/26/16 0512 04/26/16 0513 04/27/16 0356 04/27/16 0358 04/28/16 0345 04/28/16 0400  HGB 12.9*  --   --  12.6* 12.8*  --   HCT 38.9*  --   --  38.2* 38.4*  --   PLT 93*  --   --  99* 109*  --   LABPROT 24.8*  --   --  22.5*  --  19.7*  INR 2.19  --   --  1.95  --  1.65  HEPARINUNFRC  --  0.40 0.39  --   --  0.29*  CREATININE 0.59*  --   --   --  0.69  --     Estimated Creatinine Clearance: 87.4 mL/min (by C-G formula based on SCr of 0.8 mg/dL).    Assessment: 42 YOM with history of Afib and noncompliance to continue on IV heparin bridge while INR is sub-therapeutic on Coumadin.  Expect INR to trend up again as dosage has been increased.  Bleeding from IV site has resolved per RN.  Heparin level is therapeutic.    Goal of Therapy:  Heparin level 0.3-0.7 units/ml  INR 2.0-3.0 Monitor platelets by anticoagulation protocol: Yes    Plan:   - Repeat Coumadin 5mg  PO today to establish trend - Continue heparin gtt at 900 units/hr - Daily HL / CBC / PT / INR   Madyx Delfin D. Mina Marble, PharmD, BCPS Pager:  [phone] 04/28/2016, 1:19 PM

## 2016-04-29 LAB — HEPARIN LEVEL (UNFRACTIONATED): Heparin Unfractionated: 0.47 IU/mL (ref 0.30–0.70)

## 2016-04-29 LAB — CBC
HCT: 37.7 % — ABNORMAL LOW (ref 39.0–52.0)
HEMOGLOBIN: 12.4 g/dL — AB (ref 13.0–17.0)
MCH: 31.2 pg (ref 26.0–34.0)
MCHC: 32.9 g/dL (ref 30.0–36.0)
MCV: 94.7 fL (ref 78.0–100.0)
PLATELETS: 105 10*3/uL — AB (ref 150–400)
RBC: 3.98 MIL/uL — ABNORMAL LOW (ref 4.22–5.81)
RDW: 13.2 % (ref 11.5–15.5)
WBC: 5.2 10*3/uL (ref 4.0–10.5)

## 2016-04-29 LAB — PROTIME-INR
INR: 1.64
PROTHROMBIN TIME: 19.6 s — AB (ref 11.4–15.2)

## 2016-04-29 MED ORDER — WARFARIN SODIUM 7.5 MG PO TABS
7.5000 mg | ORAL_TABLET | Freq: Once | ORAL | Status: AC
  Administered 2016-04-29: 7.5 mg via ORAL
  Filled 2016-04-29: qty 1

## 2016-04-29 NOTE — Care Management Important Message (Signed)
Important Message  Patient Details  Name: Brian Martinez MRN: [id number] Date of Birth: December 28, 1950   Medicare Important Message Given:  Yes    Loann Quill 04/29/2016, 1:25 PM

## 2016-04-29 NOTE — Progress Notes (Signed)
PROGRESS NOTE    Brian Martinez  [id number] DOB: Mar 05, 1951 DOA: 04/23/2016 PCP: REDMON,NOELLE, PA-C   Brief Narrative:  65 y.o. WM PMHx Depression, Mitral valve regurgitation, Atrial Fibrillation with RVR (he is supposed to be on warfarin anticoagulation, CHADS-VASc score of 2), Severe mitral regurgitation, Thrombocytopenia   who presented to the ED for evaluation of light-headedness/dizziness and palpitations. He denied chest pain or shortness of breath. He admitted to noncompliance with digoxin and warfarin. He reported loss of appetite, weight loss, loss of interest in social interactions with friends, and forgetfulness. His wife died over one year ago and he retired last year. June was rough because his wife's birthday and their wedding anniversary fall within that month. He denied suicidal or homicidal ideation.  In the ED he was found to be in atrial fibrillation with a rapid ventricular response.  He received a total of 35mg  of IV cardizem via bolus and then a cardizem infusion at 7.5mg /hr. Troponin negative. INR normal. Digoxin level undetectable. Heparin infusion started per protocol.     Subjective: 8/11 A/O 4, states can tell when he is in A. fib with RVR because he becomes lightheaded. States lives alone and does not have family in the area. States warfarin monitored at [place]:   Principal Problem:   Atrial fibrillation with rapid ventricular response (HCC) Active Problems:   Depression   Weight loss   Medically noncompliant   Hypomagnesemia   Hypokalemia   Chronic atrial fibrillation (HCC)   Chronic atrial fibrillation with acute RVR ( CHA2DS2-VASc score is 2) -Due to noncompliance with medications plus volume depletion -Currently not rate controlled after stopping Cardizem drip. - TSH normal  - warfarin utilized due to valvular disease  Recent Labs Lab 04/25/16 0426 04/26/16 0512 04/27/16 0358 04/28/16 0400 04/29/16 0233    INR 1.46 2.19 1.95 1.65 1.64  -Atenolol 50 mg BID -Digoxin 0.125 mg daily -Warfarin per pharmacy, currently not therapeutic. -If not therapeutic by 8/12 will get patient choice of self injecting Lovenox for several days and checking as outpatient his INR level.  Mitral valve prolapse w/ severe regurgitation  -Prominent murmur on exam - followed by cardiology  Hypokalemia -Potassium goal > 4   Hypomagnesemia -Magnesium goal> 2  Hypophosphatemia -Resolved   Volume depletion - mild lactic acidosis -Resolved  Depression No evidence of suicidal ideation - clearly having intrusive frequent grief episodes with trigger being moments that remind him of his deceased wife - resume Remeron and encourage patient to continue this as an outpatient - discussed need to improve nutrition and maintain hydration - patient to see his PCP after discharge for referral to outpatient psychiatric office   DVT prophylaxis: Heparin -->Warfarin Code Status: Full Family Communication: None Disposition Plan: Resolution A. fib RVR    Consultants:  None  Procedures/Significant Events:  None  Cultures None  Antimicrobials: None   Devices    LINES / TUBES:     Continuous Infusions: . sodium chloride 10 mL/hr at 04/27/16 1543  . heparin 900 Units/hr (04/28/16 1645)     Objective: Vitals:   04/28/16 1620 04/28/16 2004 04/29/16 0100 04/29/16 0540  BP: 127/85 110/63  (!) 130/97  Pulse: 83 87  79  Resp:  17  16  Temp: 97.8 F (36.6 C) 97.4 F (36.3 C)  97.5 F (36.4 C)  TempSrc: Oral Oral  Oral  SpO2: 100% 98%  100%  Weight:   67.8 kg (149 lb 6.4 oz)  Height:        Intake/Output Summary (Last 24 hours) at 04/29/16 0902 Last data filed at 04/28/16 2200  Gross per 24 hour  Intake          1010.53 ml  Output              750 ml  Net           260.53 ml   Filed Weights   04/27/16 0558 04/28/16 0647 04/29/16 0100  Weight: 68.4 kg (150 lb 12.8 oz) 68 kg (149 lb 14.6 oz)  67.8 kg (149 lb 6.4 oz)    Examination:  General: A/O 4, NAD, No acute respiratory distress Eyes: negative scleral hemorrhage, negative anisocoria, negative icterus ENT: Negative Runny nose, negative gingival bleeding, Neck:  Negative scars, masses, torticollis, lymphadenopathy, JVD Lungs: Clear to auscultation bilaterally without wheezes or crackles Cardiovascular: Irregular irregular rhythm and rate, positive grade 3/6 systolic murmur, negative gallop,  normal S1 and S2 Abdomen: negative abdominal pain, nondistended, positive soft, bowel sounds, no rebound, no ascites, no appreciable mass Extremities: No significant cyanosis, clubbing, or edema bilateral lower extremities Skin: Negative rashes, lesions, ulcers Psychiatric:  Negative depression, negative anxiety, negative fatigue, negative mania  Central nervous system:  Cranial nerves II through XII intact, tongue/uvula midline, all extremities muscle strength 5/5, sensation intact throughout, negative dysarthria, negative expressive aphasia, negative receptive aphasia.  .     Data Reviewed: Care during the described time interval was provided by me .  I have reviewed this patient's available data, including medical history, events of note, physical examination, and all test results as part of my evaluation. I have personally reviewed and interpreted all radiology studies.  CBC:  Recent Labs Lab 04/23/16 2214  04/25/16 0425 04/26/16 0512 04/27/16 0358 04/28/16 0345 04/29/16 0233  WBC 5.6  < > 5.6 4.6 5.2 4.1 5.2  NEUTROABS 4.2  --   --   --   --   --   --   HGB 18.3*  < > 13.2 12.9* 12.6* 12.8* 12.4*  HCT 51.5  < > 39.2 38.9* 38.2* 38.4* 37.7*  MCV 93.1  < > 92.9 94.4 95.0 94.3 94.7  PLT 135*  < > 99* 93* 99* 109* 105*  < > = values in this interval not displayed. Basic Metabolic Panel:  Recent Labs Lab 04/23/16 2214 04/24/16 0221 04/24/16 0630 04/25/16 0425 04/26/16 0512 04/28/16 0345  NA 139  --  138 142 143 140   K 4.3  --  3.1* 4.1 3.7 3.8  CL 103  --  102 110 114* 106  CO2 20*  --  27 27 25 30   GLUCOSE 80  --  236* 84 87 92  BUN 47*  --  42* 30* 23* 14  CREATININE 1.29*  --  1.08 0.86 0.59* 0.69  CALCIUM 9.3  --  8.6* 8.4* 8.1* 8.1*  MG  --  2.1  --   --  1.6* 2.0  PHOS  --   --   --  1.3* 2.6 3.5   GFR: Estimated Creatinine Clearance: 87.1 mL/min (by C-G formula based on SCr of 0.8 mg/dL). Liver Function Tests:  Recent Labs Lab 04/23/16 2214 04/25/16 0425  AST 16 23  ALT 12* 17  ALKPHOS 33* 42  BILITOT 1.4* 0.8  PROT 6.0* 4.4*  ALBUMIN 3.6 2.7*   No results for input(s): LIPASE, AMYLASE in the last 168 hours. No results for input(s): AMMONIA in the last 168 hours. Coagulation Profile:  Recent Labs Lab 04/25/16 0426 04/26/16 0512 04/27/16 0358 04/28/16 0400 04/29/16 0233  INR 1.46 2.19 1.95 1.65 1.64   Cardiac Enzymes:  Recent Labs Lab 04/23/16 2214 04/24/16 0221 04/24/16 0630 04/24/16 1420  TROPONINI <0.03 <0.03 <0.03 <0.03   BNP (last 3 results) No results for input(s): PROBNP in the last 8760 hours. HbA1C: No results for input(s): HGBA1C in the last 72 hours. CBG:  Recent Labs Lab 04/23/16 2208  GLUCAP 70   Lipid Profile: No results for input(s): CHOL, HDL, LDLCALC, TRIG, CHOLHDL, LDLDIRECT in the last 72 hours. Thyroid Function Tests: No results for input(s): TSH, T4TOTAL, FREET4, T3FREE, THYROIDAB in the last 72 hours. Anemia Panel: No results for input(s): VITAMINB12, FOLATE, FERRITIN, TIBC, IRON, RETICCTPCT in the last 72 hours. Urine analysis:    Component Value Date/Time   COLORURINE ORANGE (A) 01/07/2016 2232   APPEARANCEUR TURBID (A) 01/07/2016 2232   LABSPEC 1.026 01/07/2016 2232   PHURINE 5.5 01/07/2016 2232   GLUCOSEU NEGATIVE 01/07/2016 2232   HGBUR SMALL (A) 01/07/2016 2232   BILIRUBINUR SMALL (A) 01/07/2016 2232   KETONESUR 15 (A) 01/07/2016 2232   PROTEINUR 100 (A) 01/07/2016 2232   NITRITE NEGATIVE 01/07/2016 2232    LEUKOCYTESUR SMALL (A) 01/07/2016 2232   Sepsis Labs: @LABRCNTIP (procalcitonin:4,lacticidven:4)  ) Recent Results (from the past 240 hour(s))  MRSA PCR Screening     Status: None   Collection Time: 04/24/16  2:17 AM  Result Value Ref Range Status   MRSA by PCR NEGATIVE NEGATIVE Final    Comment:        The GeneXpert MRSA Assay (FDA approved for NASAL specimens only), is one component of a comprehensive MRSA colonization surveillance program. It is not intended to diagnose MRSA infection nor to guide or monitor treatment for MRSA infections.          Radiology Studies: No results found.      Scheduled Meds: . aspirin EC  81 mg Oral Daily  . atenolol  50 mg Oral BID  . digoxin  0.125 mg Oral Daily  . feeding supplement (ENSURE ENLIVE)  237 mL Oral BID BM  . mirtazapine  7.5 mg Oral QHS  . phosphorus  500 mg Oral TID  . vitamin B-12  100 mcg Oral Daily  . Warfarin - Pharmacist Dosing Inpatient   Does not apply q1800   Continuous Infusions: . sodium chloride 10 mL/hr at 04/27/16 1543  . heparin 900 Units/hr (04/28/16 1645)     LOS: 5 days    Time spent: 40 minutes    Gurjit Loconte, Geraldo Docker, MD [facility] Pager [phone]   If 7PM-7AM, please contact night-coverage [email or web address] Password [email or web address] 04/29/2016, 9:02 AM

## 2016-04-29 NOTE — Care Management Important Message (Signed)
Important Message  Patient Details  Name: Brian Martinez MRN: [id number] Date of Birth: 1951/08/06   Medicare Important Message Given:  Yes    [name] May 04/29/2016, 10:39 AM

## 2016-04-29 NOTE — Progress Notes (Signed)
ANTICOAGULATION CONSULT NOTE - Follow up Consult  Pharmacy Consult: Heparin/Warfarin Indication: atrial fibrillation  No Known Allergies  Patient Measurements: Height: 6\' 1"  (185.4 cm) Weight: 149 lb 6.4 oz (67.8 kg) IBW/kg (Calculated) : 79.9  Heparin dosing weight = 68 kg  Vital Signs: Temp: 97.5 F (36.4 C) (08/11 0540) Temp Source: Oral (08/11 0540) BP: 130/97 (08/11 0540) Pulse Rate: 79 (08/11 0540)  Labs:  Recent Labs  04/27/16 0358 04/28/16 0345 04/28/16 0400 04/28/16 1238 04/29/16 0233 04/29/16 0615  HGB 12.6* 12.8*  --   --  12.4*  --   HCT 38.2* 38.4*  --   --  37.7*  --   PLT 99* 109*  --   --  105*  --   LABPROT 22.5*  --  19.7*  --  19.6*  --   INR 1.95  --  1.65  --  1.64  --   HEPARINUNFRC  --   --  0.29* 0.44  --  0.47  CREATININE  --  0.69  --   --   --   --     Estimated Creatinine Clearance: 87.1 mL/min (by C-G formula based on SCr of 0.8 mg/dL).   Assessment: 65 yo m with history of Afib and noncompliance to continue on IV heparin bridge while INR is sub-therapeutic on Coumadin.  INR 1.64 today (subtherapeutic), HL 0.47 (therapeutic). CBC stable, no issues with bleeding per RN.   Goal of Therapy:  Heparin level 0.3-0.7 units/ml  INR 2-3 Monitor platelets by anticoagulation protocol: Yes    Plan:   - Coumadin 7.5 mg PO x 1 tonight - Continue heparin infusion at 900 unit/hr - Daily HL / CBC / PT / INR  Cassie L. Nicole Kindred, PharmD Clinical Pharmacist Pager: [phone] 04/29/2016 10:36 AM

## 2016-04-30 LAB — CBC
HCT: 39.9 % (ref 39.0–52.0)
Hemoglobin: 13.1 g/dL (ref 13.0–17.0)
MCH: 31 pg (ref 26.0–34.0)
MCHC: 32.8 g/dL (ref 30.0–36.0)
MCV: 94.3 fL (ref 78.0–100.0)
PLATELETS: 96 10*3/uL — AB (ref 150–400)
RBC: 4.23 MIL/uL (ref 4.22–5.81)
RDW: 13.4 % (ref 11.5–15.5)
WBC: 5 10*3/uL (ref 4.0–10.5)

## 2016-04-30 LAB — HEPARIN LEVEL (UNFRACTIONATED): HEPARIN UNFRACTIONATED: 0.39 [IU]/mL (ref 0.30–0.70)

## 2016-04-30 LAB — PROTIME-INR
INR: 1.66
PROTHROMBIN TIME: 19.8 s — AB (ref 11.4–15.2)

## 2016-04-30 MED ORDER — WARFARIN SODIUM 5 MG PO TABS
5.0000 mg | ORAL_TABLET | Freq: Once | ORAL | Status: AC
  Administered 2016-04-30: 5 mg via ORAL
  Filled 2016-04-30: qty 1

## 2016-04-30 MED ORDER — ENOXAPARIN SODIUM 80 MG/0.8ML ~~LOC~~ SOLN
1.0000 mg/kg | Freq: Two times a day (BID) | SUBCUTANEOUS | Status: DC
  Administered 2016-04-30 – 2016-05-01 (×3): 65 mg via SUBCUTANEOUS
  Filled 2016-04-30 (×3): qty 0.8

## 2016-04-30 NOTE — Progress Notes (Signed)
ANTICOAGULATION CONSULT NOTE - Follow up Consult  Pharmacy Consult: Heparin/Warfarin Indication: atrial fibrillation  No Known Allergies  Patient Measurements: Height: 6\' 1"  (185.4 cm) Weight: 146 lb 1.6 oz (66.3 kg) IBW/kg (Calculated) : 79.9  Heparin dosing weight = 68 kg  Vital Signs: Temp: 98.2 F (36.8 C) (08/12 1339) Temp Source: Oral (08/12 1339) BP: 96/66 (08/12 1339) Pulse Rate: 77 (08/12 1339)  Labs:  Recent Labs  04/28/16 0345  04/28/16 0400 04/28/16 1238 04/29/16 0233 04/29/16 0615 04/30/16 0252  HGB 12.8*  --   --   --  12.4*  --  13.1  HCT 38.4*  --   --   --  37.7*  --  39.9  PLT 109*  --   --   --  105*  --  96*  LABPROT  --   --  19.7*  --  19.6*  --  19.8*  INR  --   --  1.65  --  1.64  --  1.66  HEPARINUNFRC  --   < > 0.29* 0.44  --  0.47 0.39  CREATININE 0.69  --   --   --   --   --   --   < > = values in this interval not displayed.  Estimated Creatinine Clearance: 85.2 mL/min (by C-G formula based on SCr of 0.8 mg/dL).   Assessment: 65 yo m with history of Afib and noncompliance to continue on IV heparin bridge while INR is sub-therapeutic on Coumadin.  INR 1.66 today (subtherapeutic), HL 0.39 (therapeutic). CBC stable, no issues with bleeding.  Patient's HL trending down now- will re-assess tomorrow.   Goal of Therapy:  Heparin level 0.3-0.7 units/ml  INR 2-3 Monitor platelets by anticoagulation protocol: Yes    Plan:   - Coumadin 5 mg PO x 1 tonight per PTA  - Continue heparin infusion at 900 unit/hr - Daily HL / CBC / PT / INR  Uvaldo Bristle, PharmD Pharmacy Resident  Pager: [phone] 04/30/2016 5:03 PM  ADDN: Pharmacy is consulted to transition pt from heparin to lovenox to bridge patient until therapeutic on warfarin. Will start lovenox 1 hour after heparin stopped.  Plan: Lovenox 1mg /kg subcutaneously q12h Nurse will educate pt on self administration  Andrey Cota. Diona Foley, PharmD, [place] Clinical Pharmacist Pager [phone]

## 2016-04-30 NOTE — Progress Notes (Signed)
PROGRESS NOTE    Brian Martinez  [id number] DOB: [birth year] DOA: 04/23/2016 PCP: REDMON,NOELLE, PA-C   Brief Narrative:  65 y.o. WM PMHx Depression, Mitral valve regurgitation, Atrial Fibrillation with RVR (he is supposed to be on warfarin anticoagulation, CHADS-VASc score of 2), Severe mitral regurgitation, Thrombocytopenia   who presented to the ED for evaluation of light-headedness/dizziness and palpitations. He denied chest pain or shortness of breath. He admitted to noncompliance with digoxin and warfarin. He reported loss of appetite, weight loss, loss of interest in social interactions with friends, and forgetfulness. His wife died over one year ago and he retired last year. June was rough because his wife's birthday and their wedding anniversary fall within that month. He denied suicidal or homicidal ideation.  In the ED he was found to be in atrial fibrillation with a rapid ventricular response.  He received a total of 35mg  of IV cardizem via bolus and then a cardizem infusion at 7.5mg /hr. Troponin negative. INR normal. Digoxin level undetectable. Heparin infusion started per protocol.     Subjective: 8/12 A/O 4, states can tell when he is in A. fib with RVR because he becomes lightheaded. States lives alone and does not have family in the area. States warfarin monitored at [facility] [place]:   Principal Problem:   Atrial fibrillation with rapid ventricular response (HCC) Active Problems:   Depression   Weight loss   Medically noncompliant   Hypomagnesemia   Hypokalemia   Chronic atrial fibrillation (HCC)   Chronic atrial fibrillation with acute RVR ( CHA2DS2-VASc score is 2) -Due to noncompliance with medications plus volume depletion -Currently not rate controlled after stopping Cardizem drip. - TSH normal  - warfarin utilized due to valvular disease  Recent Labs Lab 04/26/16 0512 04/27/16 0358 04/28/16 0400 04/29/16 0233 04/30/16 0252    INR 2.19 1.95 1.65 1.64 1.66  -Atenolol 50 mg BID -Digoxin 0.125 mg daily -Warfarin per pharmacy, currently not therapeutic. -Start Lovenox until therapeutic on Coumadin: Teach patient to self inject   Mitral valve prolapse w/ severe regurgitation  -Prominent murmur on exam - followed by cardiology  Hypokalemia -Potassium goal > 4   Hypomagnesemia -Magnesium goal> 2  Hypophosphatemia -Resolved   Volume depletion - mild lactic acidosis -Resolved  Depression No evidence of suicidal ideation - clearly having intrusive frequent grief episodes with trigger being moments that remind him of his deceased wife - resume Remeron and encourage patient to continue this as an outpatient - discussed need to improve nutrition and maintain hydration - patient to see his PCP after discharge for referral to outpatient psychiatric office   DVT prophylaxis: Heparin -->Warfarin Code Status: Full Family Communication: None Disposition Plan: Resolution A. fib RVR    Consultants:  None  Procedures/Significant Events:  None  Cultures None  Antimicrobials: None   Devices    LINES / TUBES:     Continuous Infusions: . sodium chloride 10 mL/hr at 04/27/16 1543  . heparin 900 Units/hr (04/29/16 1943)     Objective: Vitals:   04/29/16 1354 04/29/16 2015 04/30/16 0503 04/30/16 1339  BP: 110/85 119/80 119/80 96/66  Pulse: (!) 115 78 74 77  Resp: 20 20 20 18   Temp: 97.8 F (36.6 C) 98.5 F (36.9 C) 97.3 F (36.3 C) 98.2 F (36.8 C)  TempSrc: Oral Oral Oral Oral  SpO2: 99% (!) 77% 100% 98%  Weight:   66.3 kg (146 lb 1.6 oz)   Height:  Intake/Output Summary (Last 24 hours) at 04/30/16 1652 Last data filed at 04/30/16 1230  Gross per 24 hour  Intake              624 ml  Output             1701 ml  Net            -1077 ml   Filed Weights   04/28/16 0647 04/29/16 0100 04/30/16 0503  Weight: 68 kg (149 lb 14.6 oz) 67.8 kg (149 lb 6.4 oz) 66.3 kg (146 lb 1.6 oz)     Examination:  General: A/O 4, NAD, No acute respiratory distress Eyes: negative scleral hemorrhage, negative anisocoria, negative icterus ENT: Negative Runny nose, negative gingival bleeding, Neck:  Negative scars, masses, torticollis, lymphadenopathy, JVD Lungs: Clear to auscultation bilaterally without wheezes or crackles Cardiovascular: Irregular irregular rhythm and rate, positive grade 3/6 systolic murmur, negative gallop,  normal S1 and S2 Abdomen: negative abdominal pain, nondistended, positive soft, bowel sounds, no rebound, no ascites, no appreciable mass Extremities: No significant cyanosis, clubbing, or edema bilateral lower extremities Skin: Negative rashes, lesions, ulcers Psychiatric:  Negative depression, negative anxiety, negative fatigue, negative mania  Central nervous system:  Cranial nerves II through XII intact, tongue/uvula midline, all extremities muscle strength 5/5, sensation intact throughout, negative dysarthria, negative expressive aphasia, negative receptive aphasia.  .     Data Reviewed: Care during the described time interval was provided by me .  I have reviewed this patient's available data, including medical history, events of note, physical examination, and all test results as part of my evaluation. I have personally reviewed and interpreted all radiology studies.  CBC:  Recent Labs Lab 04/23/16 2214  04/26/16 0512 04/27/16 0358 04/28/16 0345 04/29/16 0233 04/30/16 0252  WBC 5.6  < > 4.6 5.2 4.1 5.2 5.0  NEUTROABS 4.2  --   --   --   --   --   --   HGB 18.3*  < > 12.9* 12.6* 12.8* 12.4* 13.1  HCT 51.5  < > 38.9* 38.2* 38.4* 37.7* 39.9  MCV 93.1  < > 94.4 95.0 94.3 94.7 94.3  PLT 135*  < > 93* 99* 109* 105* 96*  < > = values in this interval not displayed. Basic Metabolic Panel:  Recent Labs Lab 04/23/16 2214 04/24/16 0221 04/24/16 0630 04/25/16 0425 04/26/16 0512 04/28/16 0345  NA 139  --  138 142 143 140  K 4.3  --  3.1* 4.1  3.7 3.8  CL 103  --  102 110 114* 106  CO2 20*  --  27 27 25 30   GLUCOSE 80  --  236* 84 87 92  BUN 47*  --  42* 30* 23* 14  CREATININE 1.29*  --  1.08 0.86 0.59* 0.69  CALCIUM 9.3  --  8.6* 8.4* 8.1* 8.1*  MG  --  2.1  --   --  1.6* 2.0  PHOS  --   --   --  1.3* 2.6 3.5   GFR: Estimated Creatinine Clearance: 85.2 mL/min (by C-G formula based on SCr of 0.8 mg/dL). Liver Function Tests:  Recent Labs Lab 04/23/16 2214 04/25/16 0425  AST 16 23  ALT 12* 17  ALKPHOS 33* 42  BILITOT 1.4* 0.8  PROT 6.0* 4.4*  ALBUMIN 3.6 2.7*   No results for input(s): LIPASE, AMYLASE in the last 168 hours. No results for input(s): AMMONIA in the last 168 hours. Coagulation Profile:  Recent Labs Lab  04/26/16 0512 04/27/16 0358 04/28/16 0400 04/29/16 0233 04/30/16 0252  INR 2.19 1.95 1.65 1.64 1.66   Cardiac Enzymes:  Recent Labs Lab 04/23/16 2214 04/24/16 0221 04/24/16 0630 04/24/16 1420  TROPONINI <0.03 <0.03 <0.03 <0.03   BNP (last 3 results) No results for input(s): PROBNP in the last 8760 hours. HbA1C: No results for input(s): HGBA1C in the last 72 hours. CBG:  Recent Labs Lab 04/23/16 2208  GLUCAP 70   Lipid Profile: No results for input(s): CHOL, HDL, LDLCALC, TRIG, CHOLHDL, LDLDIRECT in the last 72 hours. Thyroid Function Tests: No results for input(s): TSH, T4TOTAL, FREET4, T3FREE, THYROIDAB in the last 72 hours. Anemia Panel: No results for input(s): VITAMINB12, FOLATE, FERRITIN, TIBC, IRON, RETICCTPCT in the last 72 hours. Urine analysis:    Component Value Date/Time   COLORURINE ORANGE (A) 01/07/2016 2232   APPEARANCEUR TURBID (A) 01/07/2016 2232   LABSPEC 1.026 01/07/2016 2232   PHURINE 5.5 01/07/2016 2232   GLUCOSEU NEGATIVE 01/07/2016 2232   HGBUR SMALL (A) 01/07/2016 2232   BILIRUBINUR SMALL (A) 01/07/2016 2232   KETONESUR 15 (A) 01/07/2016 2232   PROTEINUR 100 (A) 01/07/2016 2232   NITRITE NEGATIVE 01/07/2016 2232   LEUKOCYTESUR SMALL (A)  01/07/2016 2232   Sepsis Labs: @LABRCNTIP (procalcitonin:4,lacticidven:4)  ) Recent Results (from the past 240 hour(s))  MRSA PCR Screening     Status: None   Collection Time: 04/24/16  2:17 AM  Result Value Ref Range Status   MRSA by PCR NEGATIVE NEGATIVE Final    Comment:        The GeneXpert MRSA Assay (FDA approved for NASAL specimens only), is one component of a comprehensive MRSA colonization surveillance program. It is not intended to diagnose MRSA infection nor to guide or monitor treatment for MRSA infections.          Radiology Studies: No results found.      Scheduled Meds: . aspirin EC  81 mg Oral Daily  . atenolol  50 mg Oral BID  . digoxin  0.125 mg Oral Daily  . feeding supplement (ENSURE ENLIVE)  237 mL Oral BID BM  . mirtazapine  7.5 mg Oral QHS  . phosphorus  500 mg Oral TID  . vitamin B-12  100 mcg Oral Daily  . warfarin  5 mg Oral ONCE-1800  . Warfarin - Pharmacist Dosing Inpatient   Does not apply q1800   Continuous Infusions: . sodium chloride 10 mL/hr at 04/27/16 1543  . heparin 900 Units/hr (04/29/16 1943)     LOS: 6 days    Time spent: 40 minutes    Maeryn Mcgath, Geraldo Docker, MD [facility] Pager [phone]   If 7PM-7AM, please contact night-coverage [email or web address] Password [facility] 04/30/2016, 4:52 PM

## 2016-04-30 NOTE — Progress Notes (Signed)
ANTICOAGULATION CONSULT NOTE - Follow up Consult  Pharmacy Consult: Heparin/Warfarin Indication: atrial fibrillation  No Known Allergies  Patient Measurements: Height: 6\' 1"  (185.4 cm) Weight: 146 lb 1.6 oz (66.3 kg) IBW/kg (Calculated) : 79.9  Heparin dosing weight = 68 kg  Vital Signs: Temp: 97.3 F (36.3 C) (08/12 0503) Temp Source: Oral (08/12 0503) BP: 119/80 (08/12 0503) Pulse Rate: 74 (08/12 0503)  Labs:  Recent Labs  04/28/16 0345  04/28/16 0400 04/28/16 1238 04/29/16 0233 04/29/16 0615 04/30/16 0252  HGB 12.8*  --   --   --  12.4*  --  13.1  HCT 38.4*  --   --   --  37.7*  --  39.9  PLT 109*  --   --   --  105*  --  96*  LABPROT  --   --  19.7*  --  19.6*  --  19.8*  INR  --   --  1.65  --  1.64  --  1.66  HEPARINUNFRC  --   < > 0.29* 0.44  --  0.47 0.39  CREATININE 0.69  --   --   --   --   --   --   < > = values in this interval not displayed.  Estimated Creatinine Clearance: 85.2 mL/min (by C-G formula based on SCr of 0.8 mg/dL).   Assessment: 65 yo m with history of Afib and noncompliance to continue on IV heparin bridge while INR is sub-therapeutic on Coumadin.  INR 1.66 today (subtherapeutic), HL 0.39 (therapeutic). CBC stable, no issues with bleeding.  Patient's HL trending down now- will re-assess tomorrow.   Goal of Therapy:  Heparin level 0.3-0.7 units/ml  INR 2-3 Monitor platelets by anticoagulation protocol: Yes    Plan:   - Coumadin 5 mg PO x 1 tonight per PTA  - Continue heparin infusion at 900 unit/hr - Daily HL / CBC / PT / INR  Uvaldo Bristle, PharmD Pharmacy Resident  Pager: [phone] 04/30/2016 12:48 PM

## 2016-05-01 LAB — CBC
HCT: 37.4 % — ABNORMAL LOW (ref 39.0–52.0)
Hemoglobin: 12.2 g/dL — ABNORMAL LOW (ref 13.0–17.0)
MCH: 31.1 pg (ref 26.0–34.0)
MCHC: 32.6 g/dL (ref 30.0–36.0)
MCV: 95.4 fL (ref 78.0–100.0)
Platelets: 105 10*3/uL — ABNORMAL LOW (ref 150–400)
RBC: 3.92 MIL/uL — ABNORMAL LOW (ref 4.22–5.81)
RDW: 13.7 % (ref 11.5–15.5)
WBC: 4.1 10*3/uL (ref 4.0–10.5)

## 2016-05-01 LAB — PROTIME-INR
INR: 1.88
PROTHROMBIN TIME: 21.9 s — AB (ref 11.4–15.2)

## 2016-05-01 MED ORDER — DIGOXIN 125 MCG PO TABS: 0.1250 mg | ORAL_TABLET | Freq: Every day | ORAL | 0 refills | Status: AC

## 2016-05-01 MED ORDER — MIRTAZAPINE 7.5 MG PO TABS: 7.5000 mg | ORAL_TABLET | Freq: Every day | ORAL | 0 refills | Status: AC

## 2016-05-01 MED ORDER — ENOXAPARIN SODIUM 150 MG/ML ~~LOC~~ SOLN: 1.0000 mg/kg | Freq: Two times a day (BID) | SUBCUTANEOUS | 0 refills | Status: AC

## 2016-05-01 MED ORDER — WARFARIN SODIUM 5 MG PO TABS: 5.0000 mg | ORAL_TABLET | Freq: Once | ORAL | 0 refills | Status: AC

## 2016-05-01 MED ORDER — ATENOLOL 50 MG PO TABS: 50.0000 mg | ORAL_TABLET | Freq: Two times a day (BID) | ORAL | 0 refills | Status: AC

## 2016-05-01 MED ORDER — WARFARIN SODIUM 5 MG PO TABS
5.0000 mg | ORAL_TABLET | Freq: Once | ORAL | Status: AC
  Administered 2016-05-01: 5 mg via ORAL
  Filled 2016-05-01: qty 1

## 2016-05-01 NOTE — Progress Notes (Signed)
Pt given d/c instructions. Pt stated he does not have clothes or a ride to get home. Pt given paper scrubs and a taxi voucher. Pt then stated after the fact that his pharmacy is closed and will not be able to get his meds. MD paged. Pt given option to receive evening meds and go home, or wait til the morning. Pt states he "mine as well wait til morning at this point." D/C instructions and prescriptions at pts bedside. Taxi voucher on chart. Will continue to monitor.

## 2016-05-01 NOTE — Progress Notes (Signed)
ANTICOAGULATION CONSULT NOTE - Follow up Consult  Pharmacy Consult: Lovenox/Warfarin Indication: atrial fibrillation  No Known Allergies  Patient Measurements: Height: 6\' 1"  (185.4 cm) Weight: 144 lb 12.8 oz (65.7 kg) IBW/kg (Calculated) : 79.9  Heparin dosing weight = 68 kg  Vital Signs: Temp: 97.6 F (36.4 C) (08/13 0538) Temp Source: Oral (08/13 0538) BP: 98/57 (08/13 1025) Pulse Rate: 72 (08/13 1025)  Labs:  Recent Labs  04/28/16 1238  04/29/16 0233 04/29/16 0615 04/30/16 0252 05/01/16 0151  HGB  --   < > 12.4*  --  13.1 12.2*  HCT  --   --  37.7*  --  39.9 37.4*  PLT  --   --  105*  --  96* 105*  LABPROT  --   --  19.6*  --  19.8* 21.9*  INR  --   --  1.64  --  1.66 1.88  HEPARINUNFRC 0.44  --   --  0.47 0.39  --   < > = values in this interval not displayed.  Estimated Creatinine Clearance: 84.4 mL/min (by C-G formula based on SCr of 0.8 mg/dL).   Assessment: 65 yo m with history of Afib and noncompliance to continue on lovenox bridge while INR is sub-therapeutic on Coumadin.  INR 1.88 today (subtherapeutic) CBC stable, no issues with bleeding noted.     Goal of Therapy:  INR 2-3 Monitor platelets by anticoagulation protocol: Yes    Plan:   - Coumadin 5 mg PO x 1 tonight per PTA  - Continue Lovenox 1 mg/kg Q 12H - Daily CBC / INR  Uvaldo Bristle, PharmD Pharmacy Resident  Pager: [phone] 05/01/2016 12:02 PM

## 2016-05-01 NOTE — Discharge Summary (Signed)
Physician Discharge Summary  JUDD OLIVE [id number] DOB: [birth year] DOA: 04/23/2016  PCP: REDMON,NOELLE, PA-C  Admit date: 04/23/2016 Discharge date: 05/01/2016  Time spent: 35 minutes  Recommendations for Outpatient Follow-up:  Chronic atrial fibrillation with acute RVR ( CHA2DS2-VASc score is 2) -Due to noncompliance with medications plus volume depletion -Currently not rate controlled after stopping Cardizem drip. - TSH normal  - warfarin utilized due to valvular disease  Recent Labs Lab 04/27/16 0358 04/28/16 0400 04/29/16 0233 04/30/16 0252 05/01/16 0151  INR 1.95 1.65 1.64 1.66 1.88  -Atenolol 50 mg BID -Digoxin 0.125 mg daily -Warfarin per pharmacy, currently not therapeutic. -Lovenox 65 mg  BID -Follow-up at [facility] with PA Noelle Redmon to check INR on Tuesday for therapeutic level.   Mitral valve prolapse w/ severe regurgitation  -Prominent murmur on exam - followed by cardiology  Hypokalemia -Potassium goal > 4   Hypomagnesemia -Magnesium goal> 2  Hypophosphatemia -Resolved   Volume depletion - mild lactic acidosis -Resolved  Depression No evidence of suicidal ideation - clearly having intrusive frequent grief episodes with trigger being moments that remind him of his deceased wife - resume Remeron 7.5 mg daily. Encouraged to continue this as an outpatient  - patient to see his PCP after discharge for referral to outpatient psychiatric office      Discharge Diagnoses:  Principal Problem:   Atrial fibrillation with rapid ventricular response (HCC) Active Problems:   Depression   Weight loss   Medically noncompliant   Hypomagnesemia   Hypokalemia   Chronic atrial fibrillation [facility])   Discharge Condition: Stable  Diet recommendation: Heart healthy  Filed Weights   04/29/16 0100 04/30/16 0503 05/01/16 0538  Weight: 67.8 kg (149 lb 6.4 oz) 66.3 kg (146 lb 1.6 oz) 65.7 kg (144 lb 12.8 oz)    History of present  illness:  65 y.o. WM PMHx Depression, Mitral valve regurgitation, Atrial Fibrillation with RVR (he is supposed to be on warfarin anticoagulation, CHADS-VASc score of 2), Severe mitral regurgitation, Thrombocytopenia   who presented to the ED for evaluation of light-headedness/dizziness and palpitations. He denied chest pain or shortness of breath. He admitted to noncompliance with digoxin and warfarin. He reported loss of appetite, weight loss, loss of interest in social interactions with friends, and forgetfulness. His wife died over one year ago and he retired last year. June was rough because his wife's birthday and their wedding anniversary fall within that month. He denied suicidal or homicidal ideation.  In the ED he was found to be in atrial fibrillation with a rapid ventricular response. He received a total of 35mg  of IV cardizem via bolus and then a cardizem infusion at 7.5mg /hr. Troponin negative. INR normal. Digoxin level undetectable. Heparin infusion started per protocol.  During his hospitalization patient was found to have A. fib with RVR, which he had in the past. Had been on Coumadin monitored at [facility]. Had stopped due to noncompliance. Patient was started on Atenolol, Digoxin which resulted in great control. Patient was also started on Coumadin and instructed to return to [facility] to monitor his INR. Patient will be discharged on appropriate therapeutic dose of Lovenox BID until INR therapeutic which should be this week.      Discharge Exam: Vitals:   04/30/16 0503 04/30/16 1339 04/30/16 2139 05/01/16 0538  BP: 119/80 96/66 110/67 139/86  Pulse: 74 77 83 79  Resp: 20 18 18 18   Temp: 97.3 F (36.3 C) 98.2 F (36.8 C) 98.4  F (36.9 C) 97.6 F (36.4 C)  TempSrc: Oral Oral Oral Oral  SpO2: 100% 98% 98% 98%  Weight: 66.3 kg (146 lb 1.6 oz)   65.7 kg (144 lb 12.8 oz)  Height:        General: A/O 4, NAD, No acute respiratory  distress Eyes: negative scleral hemorrhage, negative anisocoria, negative icterus ENT: Negative Runny nose, negative gingival bleeding, Neck:  Negative scars, masses, torticollis, lymphadenopathy, JVD Lungs: Clear to auscultation bilaterally without wheezes or crackles Cardiovascular: Irregular irregular rhythm and rate, positive grade 3/6 systolic murmur, negative gallop,  normal S1 and S2   Discharge Instructions    No Known Allergies    The results of significant diagnostics from this hospitalization (including imaging, microbiology, ancillary and laboratory) are listed below for reference.    Significant Diagnostic Studies: No results found.  Microbiology: Recent Results (from the past 240 hour(s))  MRSA PCR Screening     Status: None   Collection Time: 04/24/16  2:17 AM  Result Value Ref Range Status   MRSA by PCR NEGATIVE NEGATIVE Final    Comment:        The GeneXpert MRSA Assay (FDA approved for NASAL specimens only), is one component of a comprehensive MRSA colonization surveillance program. It is not intended to diagnose MRSA infection nor to guide or monitor treatment for MRSA infections.      Labs: Basic Metabolic Panel:  Recent Labs Lab 04/25/16 0425 04/26/16 0512 04/28/16 0345  NA 142 143 140  K 4.1 3.7 3.8  CL 110 114* 106  CO2 27 25 30   GLUCOSE 84 87 92  BUN 30* 23* 14  CREATININE 0.86 0.59* 0.69  CALCIUM 8.4* 8.1* 8.1*  MG  --  1.6* 2.0  PHOS 1.3* 2.6 3.5   Liver Function Tests:  Recent Labs Lab 04/25/16 0425  AST 23  ALT 17  ALKPHOS 42  BILITOT 0.8  PROT 4.4*  ALBUMIN 2.7*   No results for input(s): LIPASE, AMYLASE in the last 168 hours. No results for input(s): AMMONIA in the last 168 hours. CBC:  Recent Labs Lab 04/27/16 0358 04/28/16 0345 04/29/16 0233 04/30/16 0252 05/01/16 0151  WBC 5.2 4.1 5.2 5.0 4.1  HGB 12.6* 12.8* 12.4* 13.1 12.2*  HCT 38.2* 38.4* 37.7* 39.9 37.4*  MCV 95.0 94.3 94.7 94.3 95.4  PLT 99*  109* 105* 96* 105*   Cardiac Enzymes:  Recent Labs Lab 04/24/16 1420  TROPONINI <0.03   BNP: BNP (last 3 results) No results for input(s): BNP in the last 8760 hours.  ProBNP (last 3 results) No results for input(s): PROBNP in the last 8760 hours.  CBG: No results for input(s): GLUCAP in the last 168 hours.     Signed:  Dia Crawford, MD [facility] [phone] pager

## 2016-05-02 LAB — CBC
HCT: 38.7 % — ABNORMAL LOW (ref 39.0–52.0)
HEMOGLOBIN: 12.8 g/dL — AB (ref 13.0–17.0)
MCH: 31.6 pg (ref 26.0–34.0)
MCHC: 33.1 g/dL (ref 30.0–36.0)
MCV: 95.6 fL (ref 78.0–100.0)
PLATELETS: 136 10*3/uL — AB (ref 150–400)
RBC: 4.05 MIL/uL — AB (ref 4.22–5.81)
RDW: 13.5 % (ref 11.5–15.5)
WBC: 4.3 10*3/uL (ref 4.0–10.5)

## 2016-05-02 LAB — PROTIME-INR
INR: 1.83
PROTHROMBIN TIME: 21.4 s — AB (ref 11.4–15.2)

## 2016-05-02 MED ORDER — WARFARIN SODIUM 7.5 MG PO TABS: 7.5000 mg | ORAL_TABLET | Freq: Once | ORAL | Status: DC

## 2016-05-02 MED FILL — ENOXAPARIN 80 MG/0.8 ML SYR: 80 | 1 days supply | Qty: 2 | Fill #0

## 2016-05-02 MED FILL — MIRTAZAPINE 15 MG TABLET: 15 | 30 days supply | Qty: 15 | Fill #0

## 2016-05-02 MED FILL — DIGOXIN 125 MCG TABLET: 125 | 30 days supply | Qty: 30 | Fill #0

## 2016-05-02 MED FILL — ATENOLOL 25 MG TABLET: 25 | 30 days supply | Qty: 120 | Fill #0

## 2016-05-02 MED FILL — WARFARIN SODIUM 5 MG TABLET: 5 | 30 days supply | Qty: 30 | Fill #0

## 2016-05-02 NOTE — Progress Notes (Signed)
05/02/2016 11:22 AM Discharge AVS meds taken today and those due this evening reviewed.  Follow-up appointments and when to call md reviewed.  Questions and concerns addressed.   D/C home per orders.  Pt. Discharged to [facility] where they will take pt to home residence. [place]

## 2016-05-02 NOTE — Progress Notes (Signed)
Physical Therapy Treatment Patient Details Name: Brian Martinez MRN: [id number] DOB: July 29, 1951 Today's Date: 05/02/2016    History of Present Illness This 65 y.o. male admitted with light headedness/dizziness and palpitations.  He admitted to not taking Warfarin and his Digoxin.  He was found to be in A-Fib with RVR and dehydrated.  PMH includes:  Depression (wife died one year ago, and pt retired),MVP, thrombocytopenia    PT Comments    Pt admitted with above diagnosis. Pt currently with functional limitations due to balance and endurance deficits. Pt improving daily.  Feels he is close to baseline and ready to go home.  Probable d/c today. Pt will benefit from skilled PT to increase their independence and safety with mobility to allow discharge to the venue listed below.    Follow Up Recommendations  Supervision - Intermittent;Outpatient PT     Equipment Recommendations  None recommended by PT    Recommendations for Other Services OT consult     Precautions / Restrictions Precautions Precautions: Fall Restrictions Weight Bearing Restrictions: No    Mobility  Bed Mobility Overal bed mobility: Independent                Transfers Overall transfer level: Independent Equipment used: None Transfers: Sit to/from Stand Sit to Stand: Independent         General transfer comment: No physical assist needed. Pt practiced sit to stand 5 x.  No LOB and did not use his UEs to stand.   Ambulation/Gait Ambulation/Gait assistance: Supervision Ambulation Distance (Feet): 25 Feet Assistive device: None Gait Pattern/deviations: Step-through pattern;Decreased stride length Gait velocity: decr, able to incr to [facility] with cues Gait velocity interpretation: Below normal speed for age/gender General Gait Details: Pt had already walked several times today. No LOB noted short distance in room.  Pt states he is at baseline.    Stairs            Wheelchair Mobility     Modified Rankin (Stroke Patients Only)       Balance Overall balance assessment: Needs assistance Sitting-balance support: No upper extremity supported;Feet supported Sitting balance-Leahy Scale: Good     Standing balance support: No upper extremity supported;During functional activity Standing balance-Leahy Scale: Good Standing balance comment: No difficulties with challenges to balance.                     Cognition Arousal/Alertness: Awake/alert Behavior During Therapy: Flat affect Overall Cognitive Status: Within Functional Limits for tasks assessed Area of Impairment: Awareness;Memory             Problem Solving: Slow processing General Comments: Pt more focused today.  Is able to verbalize his plan for discharge     Exercises General Exercises - Lower Extremity Ankle Circles/Pumps: AROM;Both;10 reps;Seated Long Arc Quad: AROM;Both;5 reps;Seated    General Comments        Pertinent Vitals/Pain Pain Assessment: No/denies pain  VSS    Home Living                      Prior Function            PT Goals (current goals can now be found in the care plan section) Progress towards PT goals: Progressing toward goals    Frequency  Min 3X/week    PT Plan Current plan remains appropriate    Co-evaluation             End of Session Equipment Utilized During  Treatment: Gait belt Activity Tolerance: Patient tolerated treatment well Patient left: in chair;with call bell/phone within reach     Time: 1047-1057 PT Time Calculation (min) (ACUTE ONLY): 10 min  Charges:  $Therapeutic Activity: 8-22 mins                    G CodesDenice Paradise 05/16/16, 1:27 PM Bartley Vuolo,PT Acute Rehabilitation [phone] (pager)

## 2016-05-02 NOTE — Care Management Important Message (Signed)
Important Message  Patient Details  Name: Brian Martinez MRN: [id number] Date of Birth: 01-13-1951   Medicare Important Message Given:  Yes    [name] May 05/02/2016, 10:44 AM

## 2016-05-03 DIAGNOSIS — Z7901 Long term (current) use of anticoagulants: Secondary | ICD-10-CM | POA: Diagnosis not present

## 2016-05-03 MED FILL — ENOXAPARIN 80 MG/0.8 ML SYR: 80 | 2 days supply | Qty: 3 | Fill #0

## 2016-05-05 ENCOUNTER — Telehealth: Payer: Self-pay

## 2016-05-05 NOTE — Telephone Encounter (Signed)
05/04/16 left message to call to schedule PT eval

## 2016-05-06 DIAGNOSIS — Z7901 Long term (current) use of anticoagulants: Secondary | ICD-10-CM | POA: Diagnosis not present

## 2016-05-10 DIAGNOSIS — Z7901 Long term (current) use of anticoagulants: Secondary | ICD-10-CM | POA: Diagnosis not present

## 2016-05-19 DIAGNOSIS — Z7901 Long term (current) use of anticoagulants: Secondary | ICD-10-CM | POA: Diagnosis not present

## 2016-05-26 MED FILL — WARFARIN SODIUM 5 MG TABLET: 5 | 30 days supply | Qty: 30 | Fill #0

## 2016-05-26 MED FILL — DIGOXIN 125 MCG TABLET: 125 | 30 days supply | Qty: 30 | Fill #0

## 2016-05-27 DIAGNOSIS — Z7901 Long term (current) use of anticoagulants: Secondary | ICD-10-CM | POA: Diagnosis not present

## 2016-05-27 MED FILL — ATENOLOL 25 MG TABLET: 25 | 30 days supply | Qty: 120 | Fill #0

## 2016-06-13 DIAGNOSIS — Z7901 Long term (current) use of anticoagulants: Secondary | ICD-10-CM | POA: Diagnosis not present

## 2016-06-23 DIAGNOSIS — Z7901 Long term (current) use of anticoagulants: Secondary | ICD-10-CM | POA: Diagnosis not present

## 2016-06-23 MED FILL — ATENOLOL 50 MG TABLET: 50 | 30 days supply | Qty: 60 | Fill #0

## 2016-06-23 MED FILL — WARFARIN SODIUM 5 MG TABLET: 5 | 39 days supply | Qty: 45 | Fill #0

## 2016-06-23 MED FILL — DIGOXIN 125 MCG TABLET: 125 | 30 days supply | Qty: 30 | Fill #0

## 2016-07-09 ENCOUNTER — Emergency Department (HOSPITAL_COMMUNITY)
Admission: EM | Admit: 2016-07-09 | Discharge: 2016-07-10 | Disposition: A | Discharge status: Home / Self Care | Payer: Medicare Other | Attending: Emergency Medicine | Admitting: Emergency Medicine

## 2016-07-09 ENCOUNTER — Encounter (HOSPITAL_COMMUNITY): Payer: Self-pay | Admitting: Nurse Practitioner

## 2016-07-09 DIAGNOSIS — F329 Major depressive disorder, single episode, unspecified: Secondary | ICD-10-CM | POA: Diagnosis not present

## 2016-07-09 DIAGNOSIS — Z7901 Long term (current) use of anticoagulants: Secondary | ICD-10-CM | POA: Insufficient documentation

## 2016-07-09 DIAGNOSIS — Z5181 Encounter for therapeutic drug level monitoring: Secondary | ICD-10-CM | POA: Diagnosis not present

## 2016-07-09 DIAGNOSIS — I10 Essential (primary) hypertension: Secondary | ICD-10-CM | POA: Insufficient documentation

## 2016-07-09 DIAGNOSIS — Z79899 Other long term (current) drug therapy: Secondary | ICD-10-CM | POA: Diagnosis not present

## 2016-07-09 DIAGNOSIS — F32A Depression, unspecified: Secondary | ICD-10-CM

## 2016-07-09 LAB — CBC WITH DIFFERENTIAL/PLATELET
BASOS ABS: 0 10*3/uL (ref 0.0–0.1)
Basophils Relative: 0 %
EOS ABS: 0 10*3/uL (ref 0.0–0.7)
EOS PCT: 0 %
HCT: 45.5 % (ref 39.0–52.0)
Hemoglobin: 15.6 g/dL (ref 13.0–17.0)
LYMPHS PCT: 32 %
Lymphs Abs: 2.9 10*3/uL (ref 0.7–4.0)
MCH: 33.4 pg (ref 26.0–34.0)
MCHC: 34.3 g/dL (ref 30.0–36.0)
MCV: 97.4 fL (ref 78.0–100.0)
MONO ABS: 0.5 10*3/uL (ref 0.1–1.0)
Monocytes Relative: 5 %
Neutro Abs: 5.8 10*3/uL (ref 1.7–7.7)
Neutrophils Relative %: 63 %
PLATELETS: 221 10*3/uL (ref 150–400)
RBC: 4.67 MIL/uL (ref 4.22–5.81)
RDW: 13.7 % (ref 11.5–15.5)
WBC: 9.3 10*3/uL (ref 4.0–10.5)

## 2016-07-09 LAB — URINALYSIS, ROUTINE W REFLEX MICROSCOPIC
Bilirubin Urine: NEGATIVE
Glucose, UA: NEGATIVE mg/dL
KETONES UR: NEGATIVE mg/dL
NITRITE: NEGATIVE
PROTEIN: 100 mg/dL — AB
Specific Gravity, Urine: 1.027 (ref 1.005–1.030)
pH: 6 (ref 5.0–8.0)

## 2016-07-09 LAB — RAPID URINE DRUG SCREEN, HOSP PERFORMED
Amphetamines: NOT DETECTED
BENZODIAZEPINES: NOT DETECTED
Barbiturates: NOT DETECTED
Cocaine: NOT DETECTED
OPIATES: NOT DETECTED
Tetrahydrocannabinol: NOT DETECTED

## 2016-07-09 LAB — URINE MICROSCOPIC-ADD ON: Squamous Epithelial / LPF: NONE SEEN

## 2016-07-09 LAB — COMPREHENSIVE METABOLIC PANEL
ALK PHOS: 53 U/L (ref 38–126)
ALT: 21 U/L (ref 17–63)
AST: 24 U/L (ref 15–41)
Albumin: 5.2 g/dL — ABNORMAL HIGH (ref 3.5–5.0)
Anion gap: 10 (ref 5–15)
BILIRUBIN TOTAL: 1 mg/dL (ref 0.3–1.2)
BUN: 51 mg/dL — AB (ref 6–20)
CALCIUM: 10.1 mg/dL (ref 8.9–10.3)
CHLORIDE: 105 mmol/L (ref 101–111)
CO2: 27 mmol/L (ref 22–32)
CREATININE: 0.95 mg/dL (ref 0.61–1.24)
Glucose, Bld: 115 mg/dL — ABNORMAL HIGH (ref 65–99)
Potassium: 4.2 mmol/L (ref 3.5–5.1)
Sodium: 142 mmol/L (ref 135–145)
TOTAL PROTEIN: 8.5 g/dL — AB (ref 6.5–8.1)

## 2016-07-09 LAB — PROTIME-INR
INR: 2.72
PROTHROMBIN TIME: 29.4 s — AB (ref 11.4–15.2)

## 2016-07-09 LAB — ETHANOL

## 2016-07-09 NOTE — ED Provider Notes (Addendum)
[facility] DEPT Provider Note   CSN: [id number] Arrival date & time: 07/09/16  1938     History   Chief Complaint Chief Complaint  Patient presents with  . Depression  . Psychiatric Evaluation    Requesting Resources    HPI Brian Martinez is a 65 y.o. male.  Patient is a 65 year old male patient with a history of atrial fibrillation, on Coumadin, hypertension, mitral regurgitation who presents with depression. He states his wife died about a year and half ago and he recently retired. He's now on [employer]. He feels like with all these recent changes in his life he's become very depressed. He is withdrawn and rarely goes out of the house. He states he doesn't interact with his friends like he used to. He used to go out to lunch with his friends which she does not do anymore. He is not eating regularly. He's not sleeping well. His family is concerned about him and feels like he may need admission for his depression. He denies any suicidal ideations. He denies any past history of depression or suicide attempts. He currently denies any physical complaints or recent illnesses.    Depression  Pertinent negatives include no chest pain, no abdominal pain, no headaches and no shortness of breath.    Past Medical History:  Diagnosis Date  . Abnormal EKG 06/10/2015  . AKI (acute kidney injury) ([place]) 12/2015  . Basal cell carcinoma    "frozen; mostly around the facial area" (04/26/2016)  . Chronic atrial fibrillation with RVR ([place]) 12/2015; 04/26/2016  . Dehydration 12/2015  . Depression   . Heart murmur   . Hypertension   . Light headedness   . MVP (mitral valve prolapse) 06/10/2015  . PVC (premature ventricular contraction) 06/10/2015  . Severe mitral regurgitation 07/30/2015  . Thrombocytopenia [facility])     Patient Active Problem List   Diagnosis Date Noted  . Chronic atrial fibrillation ([place])   . Medically noncompliant   . Hypomagnesemia   . Hypokalemia   .  Weight loss 04/24/2016  . Atrial fibrillation with rapid ventricular response ([place]) 04/23/2016  . Protein-calorie malnutrition, severe 01/09/2016  . Depression 01/08/2016  . Thrombocytopenia ([place]) 01/08/2016  . Dehydration 01/07/2016  . Chest discomfort   . Community acquired pneumonia   . Essential hypertension   . Atrial fibrillation with RVR ([place]) 12/03/2015  . AKI (acute kidney injury) ([place]) 12/03/2015  . CAP (community acquired pneumonia) 12/03/2015  . Thrombocytosis ([place]) 12/03/2015  . Hyperlipidemia 09/04/2015  . Hypertensive heart disease 09/04/2015  . Severe mitral regurgitation 07/30/2015  . PAC (premature atrial contraction) 06/10/2015  . PVC (premature ventricular contraction) 06/10/2015  . Mitral valve prolapse 06/10/2015  . Mitral regurgitation 06/10/2015  . Abnormal EKG 06/10/2015    Past Surgical History:  Procedure Laterality Date  . INGUINAL HERNIA REPAIR     "in my teens or 73s; thinking it was in my right side"       Home Medications    Prior to Admission medications   Medication Sig Start Date End Date Taking? Authorizing Provider  atenolol (TENORMIN) 50 MG tablet Take 1 tablet (50 mg total) by mouth 2 (two) times daily. 05/01/16  Yes Allie Bossier, MD  Cholecalciferol (VITAMIN D3) 2000 units TABS Take 2,000 Units by mouth daily.   Yes Historical Provider, MD  digoxin (LANOXIN) 0.125 MG tablet Take 1 tablet (0.125 mg total) by mouth daily. 05/01/16  Yes Allie Bossier, MD  mirtazapine (REMERON) 7.5 MG  tablet Take 1 tablet (7.5 mg total) by mouth at bedtime. Patient taking differently: Take 7.5 mg by mouth at bedtime as needed (sleep).  05/01/16  Yes Allie Bossier, MD  Nutritional Supplements (CARNATION BREAKFAST ESSENTIALS) PACK Take 1 Package by mouth daily with breakfast.   Yes Historical Provider, MD  vitamin B-12 (CYANOCOBALAMIN) 100 MCG tablet Take 1 tablet (100 mcg total) by mouth daily. 06/11/15  Yes Dorothy Spark, MD  warfarin (COUMADIN) 5 MG  tablet Take 1 tablet (5 mg total) by mouth one time only at 6 PM. Patient taking differently: Take 5-7.5 mg by mouth one time only at 6 PM. Take 5 mg every day except take 7.5 mg on Tuesday and Thursday 05/01/16  Yes Allie Bossier, MD  enoxaparin (LOVENOX) 150 MG/ML injection Inject 0.44 mLs (65 mg total) into the skin every 12 (twelve) hours. Patient not taking: Reported on 07/09/2016 05/01/16   Allie Bossier, MD    Family History Family History  Problem Relation Age of Onset  . Hypertension Father   . Cancer Brother   . Hypertension Brother   . Hypertension Mother   . Parkinsonism Mother   . Hypertension Brother   . Hypertension Brother     Social History Social History  Substance Use Topics  . Smoking status: Never Smoker  . Smokeless tobacco: Never Used  . Alcohol use Yes     Comment: 04/26/2016 "maybe twice/month"     Allergies   Review of patient's allergies indicates no known allergies.   Review of Systems Review of Systems  Constitutional: Negative for chills, diaphoresis, fatigue and fever.  HENT: Negative for congestion, rhinorrhea and sneezing.   Eyes: Negative.   Respiratory: Negative for cough, chest tightness and shortness of breath.   Cardiovascular: Negative for chest pain and leg swelling.  Gastrointestinal: Negative for abdominal pain, blood in stool, diarrhea, nausea and vomiting.  Genitourinary: Negative for difficulty urinating, flank pain, frequency and hematuria.  Musculoskeletal: Negative for arthralgias and back pain.  Skin: Negative for rash.  Neurological: Negative for dizziness, speech difficulty, weakness, numbness and headaches.  Psychiatric/Behavioral: Positive for decreased concentration, depression and dysphoric mood. Negative for suicidal ideas.     Physical Exam Updated Vital Signs BP 119/84 (BP Location: Right Arm)   Pulse 79   Temp 97.5 F (36.4 C) (Oral)   Resp 18   SpO2 100%   Physical Exam  Constitutional: He is oriented  to person, place, and time. He appears well-developed and well-nourished.  HENT:  Head: Normocephalic and atraumatic.  Eyes: Pupils are equal, round, and reactive to light.  Neck: Normal range of motion. Neck supple.  Cardiovascular: Normal rate, regular rhythm and normal heart sounds.   Pulmonary/Chest: Effort normal and breath sounds normal. No respiratory distress. He has no wheezes. He has no rales. He exhibits no tenderness.  Abdominal: Soft. Bowel sounds are normal. There is no tenderness. There is no rebound and no guarding.  Musculoskeletal: Normal range of motion. He exhibits no edema.  Lymphadenopathy:    He has no cervical adenopathy.  Neurological: He is alert and oriented to person, place, and time.  Skin: Skin is warm and dry. No rash noted.  Psychiatric: His affect is blunt. He exhibits a depressed mood.     ED Treatments / Results  Labs (all labs ordered are listed, but only abnormal results are displayed) Labs Reviewed  COMPREHENSIVE METABOLIC PANEL - Abnormal; Notable for the following:       Result  Value   Glucose, Bld 115 (*)    BUN 51 (*)    Total Protein 8.5 (*)    Albumin 5.2 (*)    All other components within normal limits  PROTIME-INR - Abnormal; Notable for the following:    Prothrombin Time 29.4 (*)    All other components within normal limits  URINALYSIS, ROUTINE W REFLEX MICROSCOPIC (NOT AT [facility]) - Abnormal; Notable for the following:    Color, Urine AMBER (*)    APPearance CLOUDY (*)    Hgb urine dipstick LARGE (*)    Protein, ur 100 (*)    Leukocytes, UA TRACE (*)    All other components within normal limits  URINE MICROSCOPIC-ADD ON - Abnormal; Notable for the following:    Bacteria, UA FEW (*)    Crystals CA OXALATE CRYSTALS (*)    All other components within normal limits  URINE CULTURE  ETHANOL  CBC WITH DIFFERENTIAL/PLATELET  RAPID URINE DRUG SCREEN, HOSP PERFORMED    EKG  EKG Interpretation  Date/Time:  Saturday July 09 2016  22:48:17 EDT Ventricular Rate:  78 PR Interval:    QRS Duration: 113 QT Interval:  386 QTC Calculation: 440 R Axis:   55 Text Interpretation:  Age not entered, assumed to be  65 years old for purpose of ECG interpretation Atrial fibrillation LVH with secondary repolarization abnormality Anterior Q waves, possibly due to LVH Baseline wander in lead(s) V1 V2 Since last tracing rate slower Confirmed by [facility]  MD, ELLIOTT [id number]) on 07/10/2016 12:07:49 AM       Radiology No results found.  Procedures Procedures (including critical care time)  Medications Ordered in ED Medications - No data to display   Initial Impression / Assessment and Plan / ED Course  I have reviewed the triage vital signs and the nursing notes.  Pertinent labs & imaging results that were available during my care of the patient were reviewed by me and considered in my medical decision making (see chart for details).  Clinical Course    Pt is medically cleared.  His sister in law who has more information about the patient can be reached at [phone].    Patient has been evaluated by TTS and does not meet criteria for inpatient placement. Patient is denying any suicidal ideations. He is willing to get outpatient treatment for his depression. He wants to follow-up with her usual physicians on Monday. He was also given outpatient resources and information about mobile crisis. He is willing to return if his symptoms were to worsen. I did attempt to get a hold of his sister-in-law who I talked to earlier but she is not answering the phone.  His urine did have some bacteria and white cells but is not symptomatic for UTI. Culture was sent.  Final Clinical Impressions(s) / ED Diagnoses   Final diagnoses:  Depression, unspecified depression type    New Prescriptions New Prescriptions   No medications on file     Malvin Johns, MD 07/10/16 [id number]    Malvin Johns, MD 07/10/16 [id number]

## 2016-07-09 NOTE — BH Assessment (Addendum)
Assessment Note  Brian Martinez is an 65 y.o. male presenting voluntarily to [facility] for an evaluation for depression. Patient states that a family member contacted the [employer] for a welfare check due to patient not making a trip to see family in another state. Patient states that after deciding to move he started to pack and states that he became overwhelmed stating "I really think my heart is just broken because I lost my soul mate. We were married for 42 years and we have memories in every room." Patient states that although he told his family he was going to take the trip he was actually in the home. Patient states "I just feel like they would think I should be over it by now, it happened over a year and a half ago." Patient states "I have good days and then I have some bad days." Patient states that he stayed with [facility] for thirteen months after she passed, which was helpful, but states that he has not spoken to anyone recently. Patient denies suicidal ideations. Patient denies history of suicidal ideations. Patient denies history of suicide attempts or gestures. Patient denies self injurious behaviors. Patient denies homicidal ideations. Patient denies access to firearms. Patient denies pending charges and upcoming court dates. Patient denies active probation. Patient denies auditory and visual hallucinations. Patient does not appear to be responding to internal stimuli during the assessment.  Patient denies use of drugs and alcohol. Patient UDS and BAL negative at time of assessment.   Patient calm and cooperative at time of assessment.  Patient is alert and oriented x4.  Patient is pleasant and cooperative and answers questions appropriately. Patient appears future oriented and talks about moving closer to family. Patient states that he will follow up with his PCP on Monday. Patient states that he has been speaking with his PCP about seeking treatment and he thinks that the time may be now.  Patient contracts for safety at this time. Patient states that all of his family is supportive and denies history of abuse/trauma.   Consulted with Lindon Romp, FNP who states that patient does not meet inpatient criteria, he recommends that patient be discharged with outpatient resources and follow up with his PCP on Monday.   Diagnosis: Adjustment Disorder with mixed anxiety and depressed mood          Uncomplicated Bereavement   Past Medical History:  Past Medical History:  Diagnosis Date  . Abnormal EKG 06/10/2015  . AKI (acute kidney injury) ([place]) 12/2015  . Basal cell carcinoma    "frozen; mostly around the facial area" (04/26/2016)  . Chronic atrial fibrillation with RVR ([place]) 12/2015; 04/26/2016  . Dehydration 12/2015  . Depression   . Heart murmur   . Hypertension   . Light headedness   . MVP (mitral valve prolapse) 06/10/2015  . PVC (premature ventricular contraction) 06/10/2015  . Severe mitral regurgitation 07/30/2015  . Thrombocytopenia ([place])     Past Surgical History:  Procedure Laterality Date  . INGUINAL HERNIA REPAIR     "in my teens or 60s; thinking it was in my right side"    Family History:  Family History  Problem Relation Age of Onset  . Hypertension Father   . Cancer Brother   . Hypertension Brother   . Hypertension Mother   . Parkinsonism Mother   . Hypertension Brother   . Hypertension Brother     Social History:  reports that he has never smoked. He has never used smokeless  tobacco. He reports that he drinks alcohol. He reports that he does not use drugs.  Additional Social History:  Alcohol / Drug Use Pain Medications: Denies Prescriptions: Denies Over the Counter: Denies History of alcohol / drug use?: No history of alcohol / drug abuse  CIWA: CIWA-Ar BP: 119/84 Pulse Rate: 79 COWS:    Allergies: No Known Allergies  Home Medications:  (Not in a hospital admission)  OB/GYN Status:  No LMP for male patient.  General Assessment  Data Location of Assessment: [facility] TTS Assessment: In system Is this a Tele or Face-to-Face Assessment?: Face-to-Face Is this an Initial Assessment or a Re-assessment for this encounter?: Initial Assessment Marital status: Widowed Is patient pregnant?: No Pregnancy Status: No Living Arrangements: Alone Can pt return to current living arrangement?: Yes Admission Status: Voluntary Is patient capable of signing voluntary admission?: Yes Referral Source: Self/Family/Friend     Crisis Care Plan Living Arrangements: Alone Name of Psychiatrist: None Name of Therapist: None  Education Status Is patient currently in school?: No Highest grade of school patient has completed: 3 years of college  Risk to self with the past 6 months Suicidal Ideation: No Has patient been a risk to self within the past 6 months prior to admission? : No Suicidal Intent: No Has patient had any suicidal intent within the past 6 months prior to admission? : No Is patient at risk for suicide?: No Suicidal Plan?: No Has patient had any suicidal plan within the past 6 months prior to admission? : No Access to Means: No What has been your use of drugs/alcohol within the last 12 months?: Denies Previous Attempts/Gestures: No How many times?: 0 Other Self Harm Risks: Denies Triggers for Past Attempts: None known Intentional Self Injurious Behavior: None Family Suicide History: No Recent stressful life event(s): Loss (Comment) (wife passed away over a year ago, being retired) Persecutory voices/beliefs?: No Depression: Yes Depression Symptoms: Insomnia, Isolating, Loss of interest in usual pleasures Substance abuse history and/or treatment for substance abuse?: No Suicide prevention information given to non-admitted patients: Not applicable  Risk to Others within the past 6 months Homicidal Ideation: No Does patient have any lifetime risk of violence toward others beyond the six months prior to admission? :  No Thoughts of Harm to Others: No Current Homicidal Intent: No Current Homicidal Plan: No Access to Homicidal Means: No Identified Victim: Denies History of harm to others?: No Assessment of Violence: None Noted Violent Behavior Description: Denies Does patient have access to weapons?: No Criminal Charges Pending?: No Does patient have a court date: No Is patient on probation?: No  Psychosis Hallucinations: None noted Delusions: None noted  Mental Status Report Appearance/Hygiene: Unremarkable Eye Contact: Good Motor Activity: Freedom of movement Speech: Logical/coherent Level of Consciousness: Alert Orientation: Person, Place, Time, Situation, Appropriate for developmental age Obsessive Compulsive Thoughts/Behaviors: None  Cognitive Functioning Concentration: Decreased Memory: Recent Intact, Remote Intact IQ: Average Insight: Good Impulse Control: Good Appetite: Fair Weight Gain: 0 Sleep: Decreased Total Hours of Sleep:  (3-4 hours) Vegetative Symptoms: None  ADLScreening [facility] Assessment Services) Patient's cognitive ability adequate to safely complete daily activities?: Yes Patient able to express need for assistance with ADLs?: Yes Independently performs ADLs?: Yes (appropriate for developmental age)  Prior Inpatient Therapy Prior Inpatient Therapy: No Prior Therapy Dates: N/A Prior Therapy Facilty/Provider(s): N/A Reason for Treatment: N/A  Prior Outpatient Therapy Prior Outpatient Therapy: No Prior Therapy Dates: N/A Prior Therapy Facilty/Provider(s): N/A Reason for Treatment: N/A Does patient have an ACCT team?:  No Does patient have Intensive In-House Services?  : No Does patient have [facility] services? : No Does patient have P4CC services?: No  ADL Screening (condition at time of admission) Patient's cognitive ability adequate to safely complete daily activities?: Yes Is the patient deaf or have difficulty hearing?: No Does the patient have  difficulty seeing, even when wearing glasses/contacts?: No Does the patient have difficulty concentrating, remembering, or making decisions?: No Patient able to express need for assistance with ADLs?: Yes Does the patient have difficulty dressing or bathing?: No Independently performs ADLs?: Yes (appropriate for developmental age) Does the patient have difficulty walking or climbing stairs?: No Weakness of Legs: None Weakness of Arms/Hands: None  Home Assistive Devices/Equipment Home Assistive Devices/Equipment: None    Abuse/Neglect Assessment (Assessment to be complete while patient is alone) Physical Abuse: Denies Verbal Abuse: Denies Sexual Abuse: Denies Exploitation of patient/patient's resources: Denies Self-Neglect: Denies Values / Beliefs Cultural Requests During Hospitalization: None Spiritual Requests During Hospitalization: None   Advance Directives (For Healthcare) Does patient have an advance directive?: Yes Would patient like information on creating an advanced directive?: No - patient declined information Type of Advance Directive: Healthcare Power of Attorney, Living will (sister in law and brother) Copy of advanced directive(s) in chart?: No - copy requested    Additional Information 1:1 In Past 12 Months?: No CIRT Risk: No Elopement Risk: No Does patient have medical clearance?: Yes     Disposition:  Disposition Initial Assessment Completed for this Encounter: Yes Disposition of Patient: Outpatient treatment (per Lindon Romp, FNP ) Type of outpatient treatment: Adult  On Site Evaluation by:   Reviewed with Physician:    Arnola Crittendon 07/09/2016 11:30 PM

## 2016-07-09 NOTE — BHH Counselor (Signed)
Consulted with Lindon Romp, FNP who states that patient does not currently meet inpatient criteria. He states that patient can follow up with [facility] as he stated in the assessment. He recommends patient have outpatient resources as well. Spoke with Dr. Tamera Punt who states that she prefers someone speak with the patients family to explain why the patient is not being admitted.   Rosalin Hawking, LCSW Therapeutic Triage Specialist [place] 07/09/2016 11:14 PM

## 2016-07-09 NOTE — ED Notes (Signed)
Bed: [facility] Expected date:  Expected time:  Means of arrival:  Comments: 

## 2016-07-09 NOTE — BH Assessment (Signed)
TTS Consult received. Patient not medically cleared at this time.   Rosalin Hawking, LCSW Therapeutic Triage Specialist [place] 07/09/2016 9:39 PM

## 2016-07-09 NOTE — BH Assessment (Signed)
Attempted to contact patients sister in law at [phone] and there was no answer and a generic voicemail. No voicemail was left due to no identifying information on voicemail.  Informed EDP.   Rosalin Hawking, LCSW Therapeutic Triage Specialist [place] 07/09/2016 11:42 PM

## 2016-07-09 NOTE — ED Triage Notes (Signed)
Pt states he is depressed. He reports since the death of his wife last year, he has had intermittent episodes of loss of interest in activities previously enjoyed, he adds that previously he has been able to recover from these episodes by being with extended family when they visit from [place]. He states recently the depression has worsened and he is often unable to leave the house. He denies auditory or visual hallucinations, denies suicidal and homicidal thoughts. Requesting help and resources including referral to outpatient.

## 2016-07-10 NOTE — Progress Notes (Signed)
Recommendation for Psychiatric Evaluation in the morning, however according to notes and Jovea Herbin, LCSW patient requested outpatient resources.

## 2016-07-10 NOTE — BH Assessment (Signed)
Provided patient with a list of outpatient resources. Patient continues to contract for safety and states that he feels most comfortable going home. Patient requested assistance with transportation stating that he does not have transportation home.  Informed charge nurse and [facility]. TTS Counselor provided patient with a mobile crisis card for his convenience. Patient states that he feels that he is aware and would "check myself in if I felt off." Patient states that he feels fine to discharge to his home and will contact mobile crisis if needed.    Rosalin Hawking, LCSW Therapeutic Triage Specialist [place] 07/10/2016 12:32 AM

## 2016-07-11 LAB — URINE CULTURE

## 2016-08-01 MED FILL — WARFARIN SODIUM 5 MG TABLET: 5 | 30 days supply | Qty: 34 | Fill #0

## 2016-08-01 MED FILL — DIGOXIN 125 MCG TABLET: 125 | 30 days supply | Qty: 30 | Fill #0

## 2016-08-01 MED FILL — ATENOLOL 50 MG TABLET: 50 | 30 days supply | Qty: 60 | Fill #0

## 2017-01-22 DIAGNOSIS — R634 Abnormal weight loss: Secondary | ICD-10-CM | POA: Diagnosis not present

## 2017-01-22 DIAGNOSIS — T1490XA Injury, unspecified, initial encounter: Secondary | ICD-10-CM | POA: Diagnosis not present

## 2017-01-22 DIAGNOSIS — R4182 Altered mental status, unspecified: Secondary | ICD-10-CM | POA: Diagnosis not present

## 2017-01-22 DIAGNOSIS — R918 Other nonspecific abnormal finding of lung field: Secondary | ICD-10-CM | POA: Diagnosis present

## 2017-01-22 DIAGNOSIS — I349 Nonrheumatic mitral valve disorder, unspecified: Secondary | ICD-10-CM | POA: Diagnosis not present

## 2017-01-22 DIAGNOSIS — R771 Abnormality of globulin: Secondary | ICD-10-CM | POA: Diagnosis not present

## 2017-01-22 DIAGNOSIS — R400 Somnolence: Secondary | ICD-10-CM | POA: Diagnosis not present

## 2017-01-22 DIAGNOSIS — R531 Weakness: Secondary | ICD-10-CM | POA: Diagnosis present

## 2017-01-22 DIAGNOSIS — F322 Major depressive disorder, single episode, severe without psychotic features: Secondary | ICD-10-CM | POA: Diagnosis not present

## 2017-01-22 DIAGNOSIS — N1330 Unspecified hydronephrosis: Secondary | ICD-10-CM | POA: Diagnosis not present

## 2017-01-22 DIAGNOSIS — F061 Catatonic disorder due to known physiological condition: Secondary | ICD-10-CM | POA: Diagnosis present

## 2017-01-22 DIAGNOSIS — G9340 Encephalopathy, unspecified: Secondary | ICD-10-CM | POA: Diagnosis not present

## 2017-01-22 DIAGNOSIS — Z85828 Personal history of other malignant neoplasm of skin: Secondary | ICD-10-CM | POA: Diagnosis not present

## 2017-01-22 DIAGNOSIS — N1339 Other hydronephrosis: Secondary | ICD-10-CM | POA: Diagnosis not present

## 2017-01-22 DIAGNOSIS — N201 Calculus of ureter: Secondary | ICD-10-CM | POA: Diagnosis not present

## 2017-01-22 DIAGNOSIS — F332 Major depressive disorder, recurrent severe without psychotic features: Secondary | ICD-10-CM | POA: Diagnosis present

## 2017-01-22 DIAGNOSIS — R000 Tachycardia, unspecified: Secondary | ICD-10-CM | POA: Diagnosis present

## 2017-01-22 DIAGNOSIS — Z01810 Encounter for preprocedural cardiovascular examination: Secondary | ICD-10-CM | POA: Diagnosis not present

## 2017-01-22 DIAGNOSIS — Z681 Body mass index (BMI) 19 or less, adult: Secondary | ICD-10-CM | POA: Diagnosis not present

## 2017-01-22 DIAGNOSIS — R319 Hematuria, unspecified: Secondary | ICD-10-CM | POA: Diagnosis not present

## 2017-01-22 DIAGNOSIS — R402251 Coma scale, best verbal response, oriented, in the field [EMT or ambulance]: Secondary | ICD-10-CM | POA: Diagnosis present

## 2017-01-22 DIAGNOSIS — R42 Dizziness and giddiness: Secondary | ICD-10-CM | POA: Diagnosis not present

## 2017-01-22 DIAGNOSIS — R64 Cachexia: Secondary | ICD-10-CM | POA: Diagnosis not present

## 2017-01-22 DIAGNOSIS — N132 Hydronephrosis with renal and ureteral calculous obstruction: Secondary | ICD-10-CM | POA: Diagnosis not present

## 2017-01-22 DIAGNOSIS — R911 Solitary pulmonary nodule: Secondary | ICD-10-CM | POA: Diagnosis not present

## 2017-01-22 DIAGNOSIS — R402131 Coma scale, eyes open, to sound, in the field [EMT or ambulance]: Secondary | ICD-10-CM | POA: Diagnosis present

## 2017-01-22 DIAGNOSIS — S40211A Abrasion of right shoulder, initial encounter: Secondary | ICD-10-CM | POA: Diagnosis present

## 2017-01-22 DIAGNOSIS — I509 Heart failure, unspecified: Secondary | ICD-10-CM | POA: Diagnosis present

## 2017-01-22 DIAGNOSIS — I4891 Unspecified atrial fibrillation: Secondary | ICD-10-CM | POA: Diagnosis not present

## 2017-01-22 DIAGNOSIS — R55 Syncope and collapse: Secondary | ICD-10-CM | POA: Diagnosis not present

## 2017-01-22 DIAGNOSIS — R9431 Abnormal electrocardiogram [ECG] [EKG]: Secondary | ICD-10-CM | POA: Diagnosis not present

## 2017-01-22 DIAGNOSIS — I519 Heart disease, unspecified: Secondary | ICD-10-CM | POA: Diagnosis not present

## 2017-01-22 DIAGNOSIS — K862 Cyst of pancreas: Secondary | ICD-10-CM | POA: Diagnosis present

## 2017-01-22 DIAGNOSIS — I340 Nonrheumatic mitral (valve) insufficiency: Secondary | ICD-10-CM | POA: Diagnosis present

## 2017-01-22 DIAGNOSIS — E43 Unspecified severe protein-calorie malnutrition: Secondary | ICD-10-CM | POA: Diagnosis not present

## 2017-01-22 DIAGNOSIS — F323 Major depressive disorder, single episode, severe with psychotic features: Secondary | ICD-10-CM | POA: Diagnosis not present

## 2017-01-22 DIAGNOSIS — E860 Dehydration: Secondary | ICD-10-CM | POA: Diagnosis present

## 2017-01-22 DIAGNOSIS — Z008 Encounter for other general examination: Secondary | ICD-10-CM | POA: Diagnosis not present

## 2017-01-22 DIAGNOSIS — E538 Deficiency of other specified B group vitamins: Secondary | ICD-10-CM | POA: Diagnosis present

## 2017-01-22 DIAGNOSIS — R402361 Coma scale, best motor response, obeys commands, in the field [EMT or ambulance]: Secondary | ICD-10-CM | POA: Diagnosis present

## 2017-01-22 DIAGNOSIS — Z7901 Long term (current) use of anticoagulants: Secondary | ICD-10-CM | POA: Diagnosis not present

## 2017-01-22 DIAGNOSIS — M5090 Cervical disc disorder, unspecified, unspecified cervical region: Secondary | ICD-10-CM | POA: Diagnosis present

## 2017-01-22 DIAGNOSIS — N200 Calculus of kidney: Secondary | ICD-10-CM | POA: Diagnosis not present

## 2017-01-22 DIAGNOSIS — R5383 Other fatigue: Secondary | ICD-10-CM | POA: Diagnosis not present

## 2017-01-30 DIAGNOSIS — F329 Major depressive disorder, single episode, unspecified: Secondary | ICD-10-CM | POA: Diagnosis not present

## 2017-01-30 DIAGNOSIS — S51801D Unspecified open wound of right forearm, subsequent encounter: Secondary | ICD-10-CM | POA: Diagnosis not present

## 2017-02-04 DIAGNOSIS — S51801D Unspecified open wound of right forearm, subsequent encounter: Secondary | ICD-10-CM | POA: Diagnosis not present

## 2017-02-04 DIAGNOSIS — F329 Major depressive disorder, single episode, unspecified: Secondary | ICD-10-CM | POA: Diagnosis not present

## 2017-02-06 DIAGNOSIS — S51801D Unspecified open wound of right forearm, subsequent encounter: Secondary | ICD-10-CM | POA: Diagnosis not present

## 2017-02-06 DIAGNOSIS — F329 Major depressive disorder, single episode, unspecified: Secondary | ICD-10-CM | POA: Diagnosis not present

## 2017-02-08 DIAGNOSIS — S51801D Unspecified open wound of right forearm, subsequent encounter: Secondary | ICD-10-CM | POA: Diagnosis not present

## 2017-02-08 DIAGNOSIS — F329 Major depressive disorder, single episode, unspecified: Secondary | ICD-10-CM | POA: Diagnosis not present

## 2017-02-11 DIAGNOSIS — E860 Dehydration: Secondary | ICD-10-CM | POA: Diagnosis not present

## 2017-02-11 DIAGNOSIS — I341 Nonrheumatic mitral (valve) prolapse: Secondary | ICD-10-CM | POA: Diagnosis not present

## 2017-02-11 DIAGNOSIS — R55 Syncope and collapse: Secondary | ICD-10-CM | POA: Diagnosis not present

## 2017-02-11 DIAGNOSIS — E440 Moderate protein-calorie malnutrition: Secondary | ICD-10-CM | POA: Diagnosis not present

## 2017-02-13 DIAGNOSIS — I341 Nonrheumatic mitral (valve) prolapse: Secondary | ICD-10-CM | POA: Diagnosis not present

## 2017-02-13 DIAGNOSIS — F333 Major depressive disorder, recurrent, severe with psychotic symptoms: Secondary | ICD-10-CM | POA: Diagnosis not present

## 2017-02-13 DIAGNOSIS — E860 Dehydration: Secondary | ICD-10-CM | POA: Diagnosis not present

## 2017-03-08 DIAGNOSIS — F329 Major depressive disorder, single episode, unspecified: Secondary | ICD-10-CM | POA: Diagnosis not present

## 2017-03-08 DIAGNOSIS — F17210 Nicotine dependence, cigarettes, uncomplicated: Secondary | ICD-10-CM | POA: Diagnosis present

## 2017-03-08 DIAGNOSIS — I82890 Acute embolism and thrombosis of other specified veins: Secondary | ICD-10-CM | POA: Diagnosis not present

## 2017-03-08 DIAGNOSIS — Z85118 Personal history of other malignant neoplasm of bronchus and lung: Secondary | ICD-10-CM | POA: Diagnosis not present

## 2017-03-08 DIAGNOSIS — I2699 Other pulmonary embolism without acute cor pulmonale: Secondary | ICD-10-CM | POA: Diagnosis not present

## 2017-03-08 DIAGNOSIS — L03032 Cellulitis of left toe: Secondary | ICD-10-CM | POA: Diagnosis not present

## 2017-03-08 DIAGNOSIS — I4891 Unspecified atrial fibrillation: Secondary | ICD-10-CM | POA: Diagnosis not present

## 2017-03-08 DIAGNOSIS — R000 Tachycardia, unspecified: Secondary | ICD-10-CM | POA: Diagnosis not present

## 2017-03-08 DIAGNOSIS — R791 Abnormal coagulation profile: Secondary | ICD-10-CM | POA: Diagnosis present

## 2017-03-08 DIAGNOSIS — I82492 Acute embolism and thrombosis of other specified deep vein of left lower extremity: Secondary | ICD-10-CM | POA: Diagnosis not present

## 2017-03-08 DIAGNOSIS — F322 Major depressive disorder, single episode, severe without psychotic features: Secondary | ICD-10-CM | POA: Diagnosis not present

## 2017-03-08 DIAGNOSIS — S92052A Displaced other extraarticular fracture of left calcaneus, initial encounter for closed fracture: Secondary | ICD-10-CM | POA: Diagnosis present

## 2017-03-08 DIAGNOSIS — M7989 Other specified soft tissue disorders: Secondary | ICD-10-CM | POA: Diagnosis not present

## 2017-03-08 DIAGNOSIS — I824Z2 Acute embolism and thrombosis of unspecified deep veins of left distal lower extremity: Secondary | ICD-10-CM | POA: Diagnosis not present

## 2017-03-08 DIAGNOSIS — R600 Localized edema: Secondary | ICD-10-CM | POA: Diagnosis not present

## 2017-03-08 DIAGNOSIS — I5032 Chronic diastolic (congestive) heart failure: Secondary | ICD-10-CM | POA: Diagnosis not present

## 2017-03-08 DIAGNOSIS — I82491 Acute embolism and thrombosis of other specified deep vein of right lower extremity: Secondary | ICD-10-CM | POA: Diagnosis not present

## 2017-03-08 DIAGNOSIS — I340 Nonrheumatic mitral (valve) insufficiency: Secondary | ICD-10-CM | POA: Diagnosis present

## 2017-03-08 DIAGNOSIS — F323 Major depressive disorder, single episode, severe with psychotic features: Secondary | ICD-10-CM | POA: Diagnosis not present

## 2017-03-08 DIAGNOSIS — Z7901 Long term (current) use of anticoagulants: Secondary | ICD-10-CM | POA: Diagnosis not present

## 2017-03-08 DIAGNOSIS — S92002A Unspecified fracture of left calcaneus, initial encounter for closed fracture: Secondary | ICD-10-CM | POA: Diagnosis not present

## 2017-03-08 DIAGNOSIS — I341 Nonrheumatic mitral (valve) prolapse: Secondary | ICD-10-CM | POA: Diagnosis present

## 2017-03-08 DIAGNOSIS — S8002XA Contusion of left knee, initial encounter: Secondary | ICD-10-CM | POA: Diagnosis present

## 2017-03-08 DIAGNOSIS — I509 Heart failure, unspecified: Secondary | ICD-10-CM | POA: Diagnosis not present

## 2017-03-08 DIAGNOSIS — I481 Persistent atrial fibrillation: Secondary | ICD-10-CM | POA: Diagnosis present

## 2017-03-08 DIAGNOSIS — I349 Nonrheumatic mitral valve disorder, unspecified: Secondary | ICD-10-CM | POA: Diagnosis not present

## 2017-03-15 DIAGNOSIS — I82402 Acute embolism and thrombosis of unspecified deep veins of left lower extremity: Secondary | ICD-10-CM | POA: Diagnosis not present

## 2017-03-15 DIAGNOSIS — S92002D Unspecified fracture of left calcaneus, subsequent encounter for fracture with routine healing: Secondary | ICD-10-CM | POA: Diagnosis not present

## 2017-03-16 DIAGNOSIS — S92002D Unspecified fracture of left calcaneus, subsequent encounter for fracture with routine healing: Secondary | ICD-10-CM | POA: Diagnosis not present

## 2017-03-16 DIAGNOSIS — I82402 Acute embolism and thrombosis of unspecified deep veins of left lower extremity: Secondary | ICD-10-CM | POA: Diagnosis not present

## 2017-03-20 DIAGNOSIS — S92002D Unspecified fracture of left calcaneus, subsequent encounter for fracture with routine healing: Secondary | ICD-10-CM | POA: Diagnosis not present

## 2017-03-20 DIAGNOSIS — I82402 Acute embolism and thrombosis of unspecified deep veins of left lower extremity: Secondary | ICD-10-CM | POA: Diagnosis not present

## 2017-03-21 DIAGNOSIS — S92002D Unspecified fracture of left calcaneus, subsequent encounter for fracture with routine healing: Secondary | ICD-10-CM | POA: Diagnosis not present

## 2017-03-21 DIAGNOSIS — I82402 Acute embolism and thrombosis of unspecified deep veins of left lower extremity: Secondary | ICD-10-CM | POA: Diagnosis not present

## 2017-03-27 DIAGNOSIS — S92002D Unspecified fracture of left calcaneus, subsequent encounter for fracture with routine healing: Secondary | ICD-10-CM | POA: Diagnosis not present

## 2017-03-27 DIAGNOSIS — I82402 Acute embolism and thrombosis of unspecified deep veins of left lower extremity: Secondary | ICD-10-CM | POA: Diagnosis not present

## 2017-03-30 DIAGNOSIS — S92002D Unspecified fracture of left calcaneus, subsequent encounter for fracture with routine healing: Secondary | ICD-10-CM | POA: Diagnosis not present

## 2017-03-30 DIAGNOSIS — I82402 Acute embolism and thrombosis of unspecified deep veins of left lower extremity: Secondary | ICD-10-CM | POA: Diagnosis not present

## 2017-04-04 DIAGNOSIS — S92002D Unspecified fracture of left calcaneus, subsequent encounter for fracture with routine healing: Secondary | ICD-10-CM | POA: Diagnosis not present

## 2017-04-04 DIAGNOSIS — I82402 Acute embolism and thrombosis of unspecified deep veins of left lower extremity: Secondary | ICD-10-CM | POA: Diagnosis not present

## 2017-04-11 DIAGNOSIS — I82402 Acute embolism and thrombosis of unspecified deep veins of left lower extremity: Secondary | ICD-10-CM | POA: Diagnosis not present

## 2017-04-11 DIAGNOSIS — S92002D Unspecified fracture of left calcaneus, subsequent encounter for fracture with routine healing: Secondary | ICD-10-CM | POA: Diagnosis not present

## 2017-04-12 DIAGNOSIS — I82402 Acute embolism and thrombosis of unspecified deep veins of left lower extremity: Secondary | ICD-10-CM | POA: Diagnosis not present

## 2017-04-12 DIAGNOSIS — S92002D Unspecified fracture of left calcaneus, subsequent encounter for fracture with routine healing: Secondary | ICD-10-CM | POA: Diagnosis not present

## 2017-04-14 DIAGNOSIS — S92002D Unspecified fracture of left calcaneus, subsequent encounter for fracture with routine healing: Secondary | ICD-10-CM | POA: Diagnosis not present

## 2017-04-14 DIAGNOSIS — I82402 Acute embolism and thrombosis of unspecified deep veins of left lower extremity: Secondary | ICD-10-CM | POA: Diagnosis not present

## 2017-04-19 DIAGNOSIS — S92002D Unspecified fracture of left calcaneus, subsequent encounter for fracture with routine healing: Secondary | ICD-10-CM | POA: Diagnosis not present

## 2017-04-19 DIAGNOSIS — I82402 Acute embolism and thrombosis of unspecified deep veins of left lower extremity: Secondary | ICD-10-CM | POA: Diagnosis not present

## 2017-04-25 DIAGNOSIS — I82402 Acute embolism and thrombosis of unspecified deep veins of left lower extremity: Secondary | ICD-10-CM | POA: Diagnosis not present

## 2017-04-25 DIAGNOSIS — S8292XA Unspecified fracture of left lower leg, initial encounter for closed fracture: Secondary | ICD-10-CM | POA: Diagnosis not present

## 2017-04-27 DIAGNOSIS — S92002D Unspecified fracture of left calcaneus, subsequent encounter for fracture with routine healing: Secondary | ICD-10-CM | POA: Diagnosis not present

## 2017-04-27 DIAGNOSIS — I82402 Acute embolism and thrombosis of unspecified deep veins of left lower extremity: Secondary | ICD-10-CM | POA: Diagnosis not present

## 2017-05-11 DIAGNOSIS — S92002D Unspecified fracture of left calcaneus, subsequent encounter for fracture with routine healing: Secondary | ICD-10-CM | POA: Diagnosis not present

## 2017-05-11 DIAGNOSIS — I82402 Acute embolism and thrombosis of unspecified deep veins of left lower extremity: Secondary | ICD-10-CM | POA: Diagnosis not present

## 2017-05-12 DIAGNOSIS — I82402 Acute embolism and thrombosis of unspecified deep veins of left lower extremity: Secondary | ICD-10-CM | POA: Diagnosis not present

## 2017-05-12 DIAGNOSIS — S92002D Unspecified fracture of left calcaneus, subsequent encounter for fracture with routine healing: Secondary | ICD-10-CM | POA: Diagnosis not present

## 2017-05-16 DIAGNOSIS — M79672 Pain in left foot: Secondary | ICD-10-CM | POA: Diagnosis not present

## 2017-05-16 DIAGNOSIS — S92062D Displaced intraarticular fracture of left calcaneus, subsequent encounter for fracture with routine healing: Secondary | ICD-10-CM | POA: Diagnosis not present

## 2017-06-02 DIAGNOSIS — I340 Nonrheumatic mitral (valve) insufficiency: Secondary | ICD-10-CM | POA: Diagnosis present

## 2017-06-02 DIAGNOSIS — F322 Major depressive disorder, single episode, severe without psychotic features: Secondary | ICD-10-CM | POA: Diagnosis not present

## 2017-06-02 DIAGNOSIS — Z600 Problems of adjustment to life-cycle transitions: Secondary | ICD-10-CM | POA: Diagnosis not present

## 2017-06-02 DIAGNOSIS — R4589 Other symptoms and signs involving emotional state: Secondary | ICD-10-CM | POA: Diagnosis not present

## 2017-06-02 DIAGNOSIS — Z634 Disappearance and death of family member: Secondary | ICD-10-CM | POA: Diagnosis not present

## 2017-06-02 DIAGNOSIS — Z86718 Personal history of other venous thrombosis and embolism: Secondary | ICD-10-CM | POA: Diagnosis not present

## 2017-06-02 DIAGNOSIS — F323 Major depressive disorder, single episode, severe with psychotic features: Secondary | ICD-10-CM | POA: Diagnosis not present

## 2017-06-02 DIAGNOSIS — Z86711 Personal history of pulmonary embolism: Secondary | ICD-10-CM | POA: Diagnosis not present

## 2017-06-02 DIAGNOSIS — I4891 Unspecified atrial fibrillation: Secondary | ICD-10-CM | POA: Diagnosis present

## 2017-06-02 DIAGNOSIS — I509 Heart failure, unspecified: Secondary | ICD-10-CM | POA: Diagnosis not present

## 2017-06-02 DIAGNOSIS — Z7901 Long term (current) use of anticoagulants: Secondary | ICD-10-CM | POA: Diagnosis not present

## 2017-06-02 DIAGNOSIS — F332 Major depressive disorder, recurrent severe without psychotic features: Secondary | ICD-10-CM | POA: Diagnosis not present

## 2017-06-02 DIAGNOSIS — S92052A Displaced other extraarticular fracture of left calcaneus, initial encounter for closed fracture: Secondary | ICD-10-CM | POA: Diagnosis not present

## 2017-06-13 DIAGNOSIS — F321 Major depressive disorder, single episode, moderate: Secondary | ICD-10-CM | POA: Diagnosis not present

## 2017-06-14 DIAGNOSIS — I482 Chronic atrial fibrillation: Secondary | ICD-10-CM | POA: Diagnosis not present

## 2017-06-20 DIAGNOSIS — F321 Major depressive disorder, single episode, moderate: Secondary | ICD-10-CM | POA: Diagnosis not present

## 2017-06-22 DIAGNOSIS — Z86711 Personal history of pulmonary embolism: Secondary | ICD-10-CM | POA: Diagnosis not present

## 2017-06-22 DIAGNOSIS — G4700 Insomnia, unspecified: Secondary | ICD-10-CM | POA: Diagnosis present

## 2017-06-22 DIAGNOSIS — I509 Heart failure, unspecified: Secondary | ICD-10-CM | POA: Diagnosis not present

## 2017-06-22 DIAGNOSIS — I4891 Unspecified atrial fibrillation: Secondary | ICD-10-CM | POA: Diagnosis present

## 2017-06-22 DIAGNOSIS — K5900 Constipation, unspecified: Secondary | ICD-10-CM | POA: Diagnosis not present

## 2017-06-22 DIAGNOSIS — R791 Abnormal coagulation profile: Secondary | ICD-10-CM | POA: Diagnosis present

## 2017-06-22 DIAGNOSIS — Z7901 Long term (current) use of anticoagulants: Secondary | ICD-10-CM | POA: Diagnosis not present

## 2017-06-22 DIAGNOSIS — Z634 Disappearance and death of family member: Secondary | ICD-10-CM | POA: Diagnosis not present

## 2017-06-22 DIAGNOSIS — F332 Major depressive disorder, recurrent severe without psychotic features: Secondary | ICD-10-CM | POA: Diagnosis not present

## 2017-06-22 DIAGNOSIS — R531 Weakness: Secondary | ICD-10-CM | POA: Diagnosis not present

## 2017-06-22 DIAGNOSIS — Z86718 Personal history of other venous thrombosis and embolism: Secondary | ICD-10-CM | POA: Diagnosis not present

## 2017-06-22 DIAGNOSIS — F323 Major depressive disorder, single episode, severe with psychotic features: Secondary | ICD-10-CM | POA: Diagnosis not present

## 2017-06-22 DIAGNOSIS — S92052A Displaced other extraarticular fracture of left calcaneus, initial encounter for closed fracture: Secondary | ICD-10-CM | POA: Diagnosis not present

## 2017-06-22 DIAGNOSIS — F329 Major depressive disorder, single episode, unspecified: Secondary | ICD-10-CM | POA: Diagnosis not present

## 2017-06-22 DIAGNOSIS — F330 Major depressive disorder, recurrent, mild: Secondary | ICD-10-CM | POA: Diagnosis present

## 2017-06-22 DIAGNOSIS — I340 Nonrheumatic mitral (valve) insufficiency: Secondary | ICD-10-CM | POA: Diagnosis present

## 2017-06-22 DIAGNOSIS — E46 Unspecified protein-calorie malnutrition: Secondary | ICD-10-CM | POA: Diagnosis not present

## 2017-06-27 DIAGNOSIS — F321 Major depressive disorder, single episode, moderate: Secondary | ICD-10-CM | POA: Diagnosis not present

## 2017-06-27 DIAGNOSIS — Z634 Disappearance and death of family member: Secondary | ICD-10-CM | POA: Diagnosis not present

## 2017-06-27 DIAGNOSIS — F332 Major depressive disorder, recurrent severe without psychotic features: Secondary | ICD-10-CM | POA: Diagnosis not present

## 2017-06-29 DIAGNOSIS — F332 Major depressive disorder, recurrent severe without psychotic features: Secondary | ICD-10-CM | POA: Diagnosis not present

## 2017-06-29 DIAGNOSIS — F209 Schizophrenia, unspecified: Secondary | ICD-10-CM | POA: Diagnosis not present

## 2017-06-30 DIAGNOSIS — I482 Chronic atrial fibrillation: Secondary | ICD-10-CM | POA: Diagnosis not present

## 2017-06-30 DIAGNOSIS — I340 Nonrheumatic mitral (valve) insufficiency: Secondary | ICD-10-CM | POA: Diagnosis not present

## 2017-06-30 DIAGNOSIS — F332 Major depressive disorder, recurrent severe without psychotic features: Secondary | ICD-10-CM | POA: Diagnosis not present

## 2017-07-03 DIAGNOSIS — F321 Major depressive disorder, single episode, moderate: Secondary | ICD-10-CM | POA: Diagnosis not present

## 2017-07-03 DIAGNOSIS — F332 Major depressive disorder, recurrent severe without psychotic features: Secondary | ICD-10-CM | POA: Diagnosis not present

## 2017-07-04 DIAGNOSIS — F321 Major depressive disorder, single episode, moderate: Secondary | ICD-10-CM | POA: Diagnosis not present

## 2017-07-04 DIAGNOSIS — F332 Major depressive disorder, recurrent severe without psychotic features: Secondary | ICD-10-CM | POA: Diagnosis not present

## 2017-07-05 DIAGNOSIS — I509 Heart failure, unspecified: Secondary | ICD-10-CM | POA: Diagnosis not present

## 2017-07-05 DIAGNOSIS — I482 Chronic atrial fibrillation: Secondary | ICD-10-CM | POA: Diagnosis not present

## 2017-07-05 DIAGNOSIS — F419 Anxiety disorder, unspecified: Secondary | ICD-10-CM | POA: Diagnosis not present

## 2017-07-05 DIAGNOSIS — E441 Mild protein-calorie malnutrition: Secondary | ICD-10-CM | POA: Diagnosis not present

## 2017-07-06 DIAGNOSIS — F330 Major depressive disorder, recurrent, mild: Secondary | ICD-10-CM | POA: Diagnosis not present

## 2017-07-06 DIAGNOSIS — Z603 Acculturation difficulty: Secondary | ICD-10-CM | POA: Diagnosis not present

## 2017-07-06 DIAGNOSIS — F332 Major depressive disorder, recurrent severe without psychotic features: Secondary | ICD-10-CM | POA: Diagnosis not present

## 2017-07-06 DIAGNOSIS — F321 Major depressive disorder, single episode, moderate: Secondary | ICD-10-CM | POA: Diagnosis not present

## 2017-07-10 DIAGNOSIS — F332 Major depressive disorder, recurrent severe without psychotic features: Secondary | ICD-10-CM | POA: Diagnosis not present

## 2017-07-10 DIAGNOSIS — F321 Major depressive disorder, single episode, moderate: Secondary | ICD-10-CM | POA: Diagnosis not present

## 2017-07-11 DIAGNOSIS — F332 Major depressive disorder, recurrent severe without psychotic features: Secondary | ICD-10-CM | POA: Diagnosis not present

## 2017-07-11 DIAGNOSIS — F321 Major depressive disorder, single episode, moderate: Secondary | ICD-10-CM | POA: Diagnosis not present

## 2017-07-13 DIAGNOSIS — F332 Major depressive disorder, recurrent severe without psychotic features: Secondary | ICD-10-CM | POA: Diagnosis not present

## 2017-07-13 DIAGNOSIS — F321 Major depressive disorder, single episode, moderate: Secondary | ICD-10-CM | POA: Diagnosis not present

## 2017-07-17 DIAGNOSIS — F332 Major depressive disorder, recurrent severe without psychotic features: Secondary | ICD-10-CM | POA: Diagnosis not present

## 2017-07-17 DIAGNOSIS — F321 Major depressive disorder, single episode, moderate: Secondary | ICD-10-CM | POA: Diagnosis not present

## 2017-07-18 DIAGNOSIS — F332 Major depressive disorder, recurrent severe without psychotic features: Secondary | ICD-10-CM | POA: Diagnosis not present

## 2017-07-18 DIAGNOSIS — F321 Major depressive disorder, single episode, moderate: Secondary | ICD-10-CM | POA: Diagnosis not present

## 2017-07-20 DIAGNOSIS — F332 Major depressive disorder, recurrent severe without psychotic features: Secondary | ICD-10-CM | POA: Diagnosis not present

## 2017-07-20 DIAGNOSIS — F321 Major depressive disorder, single episode, moderate: Secondary | ICD-10-CM | POA: Diagnosis not present

## 2017-07-20 DIAGNOSIS — F330 Major depressive disorder, recurrent, mild: Secondary | ICD-10-CM | POA: Diagnosis not present

## 2017-07-24 DIAGNOSIS — F321 Major depressive disorder, single episode, moderate: Secondary | ICD-10-CM | POA: Diagnosis not present

## 2017-07-24 DIAGNOSIS — F332 Major depressive disorder, recurrent severe without psychotic features: Secondary | ICD-10-CM | POA: Diagnosis not present

## 2017-07-25 DIAGNOSIS — F332 Major depressive disorder, recurrent severe without psychotic features: Secondary | ICD-10-CM | POA: Diagnosis not present

## 2017-07-25 DIAGNOSIS — F321 Major depressive disorder, single episode, moderate: Secondary | ICD-10-CM | POA: Diagnosis not present

## 2017-07-27 DIAGNOSIS — F332 Major depressive disorder, recurrent severe without psychotic features: Secondary | ICD-10-CM | POA: Diagnosis not present

## 2017-07-27 DIAGNOSIS — I340 Nonrheumatic mitral (valve) insufficiency: Secondary | ICD-10-CM | POA: Diagnosis not present

## 2017-07-27 DIAGNOSIS — I482 Chronic atrial fibrillation: Secondary | ICD-10-CM | POA: Diagnosis not present

## 2017-07-27 DIAGNOSIS — F321 Major depressive disorder, single episode, moderate: Secondary | ICD-10-CM | POA: Diagnosis not present

## 2017-07-31 DIAGNOSIS — F332 Major depressive disorder, recurrent severe without psychotic features: Secondary | ICD-10-CM | POA: Diagnosis not present

## 2017-09-04 DIAGNOSIS — I482 Chronic atrial fibrillation: Secondary | ICD-10-CM | POA: Diagnosis not present

## 2017-09-04 DIAGNOSIS — G4700 Insomnia, unspecified: Secondary | ICD-10-CM | POA: Diagnosis not present

## 2017-09-04 DIAGNOSIS — I10 Essential (primary) hypertension: Secondary | ICD-10-CM | POA: Diagnosis not present

## 2017-09-04 DIAGNOSIS — F338 Other recurrent depressive disorders: Secondary | ICD-10-CM | POA: Diagnosis not present

## 2017-10-06 IMAGING — DX DG CHEST 1V PORT
2 series · 2 of 2 positions shown · non-contrast
Comparison: None.

CLINICAL DATA: Nasal and chest congestion

EXAM:
PORTABLE CHEST 1 VIEW

[chest ap (1 of 2)]
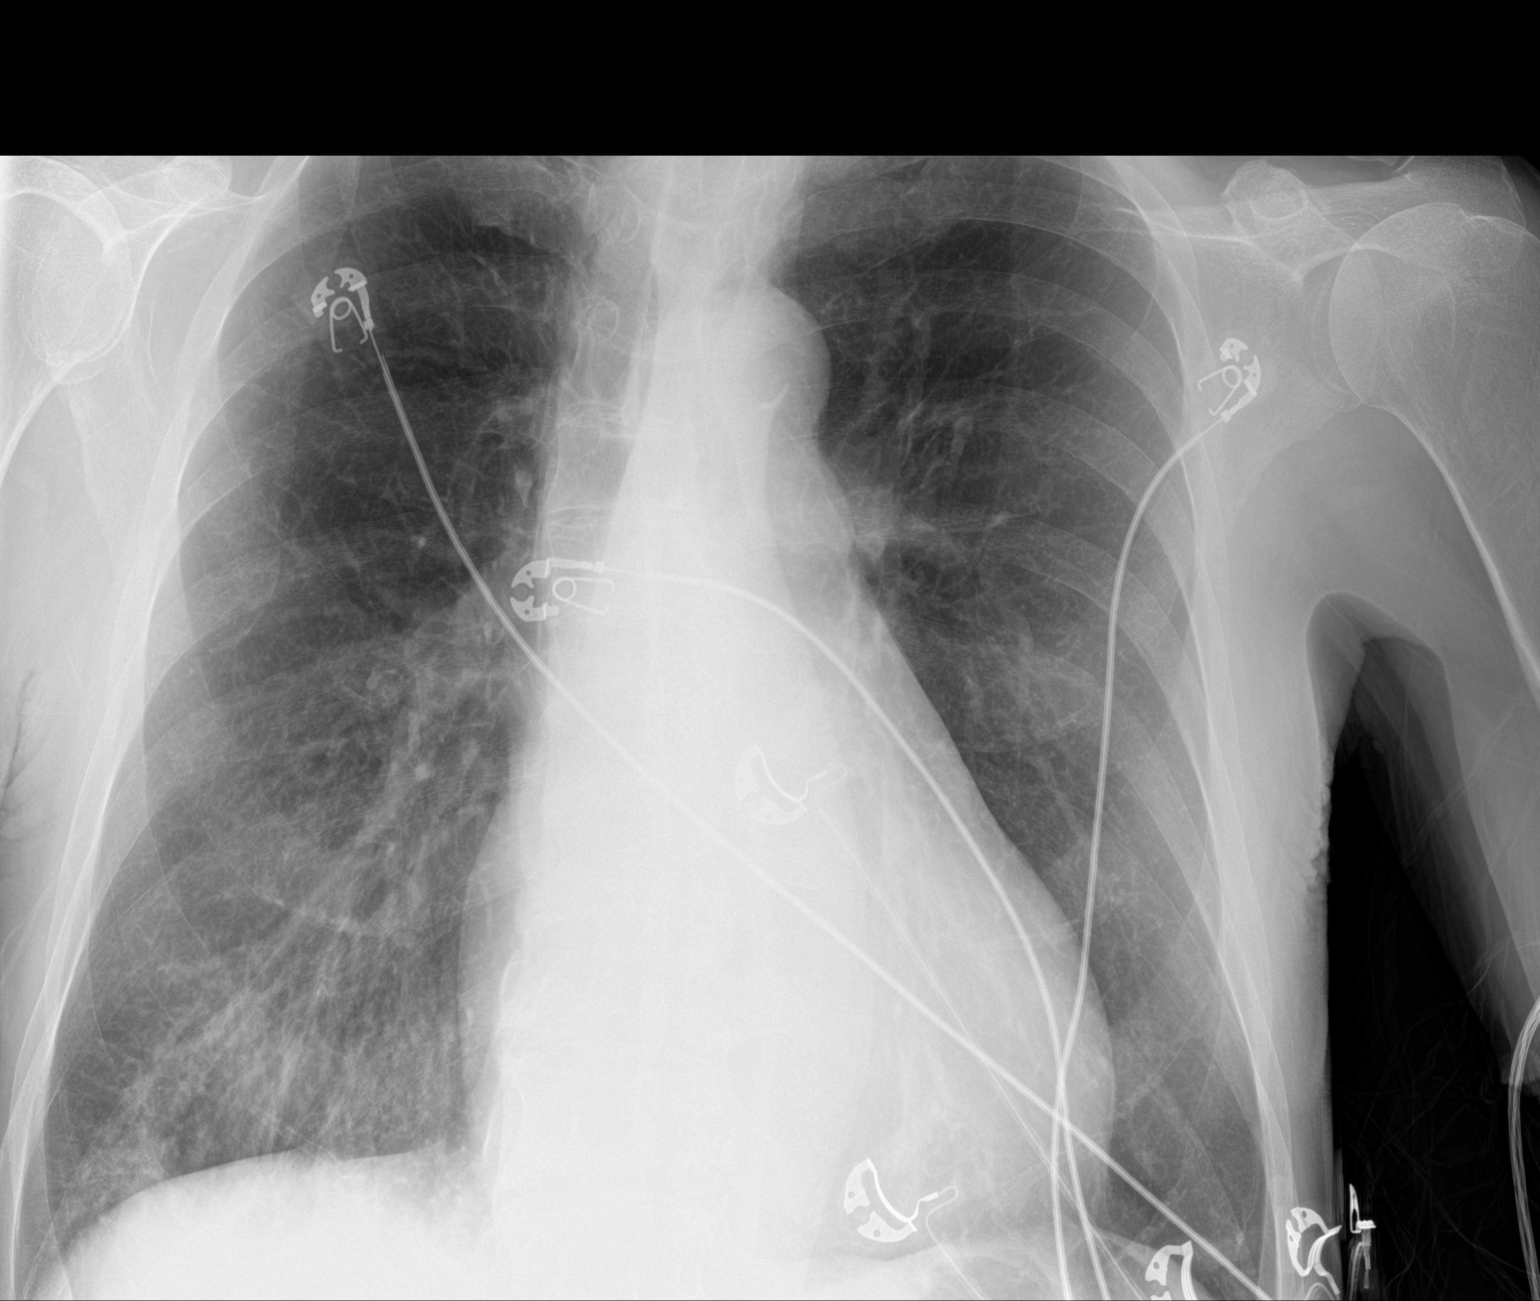

[chest ap (2 of 2)]
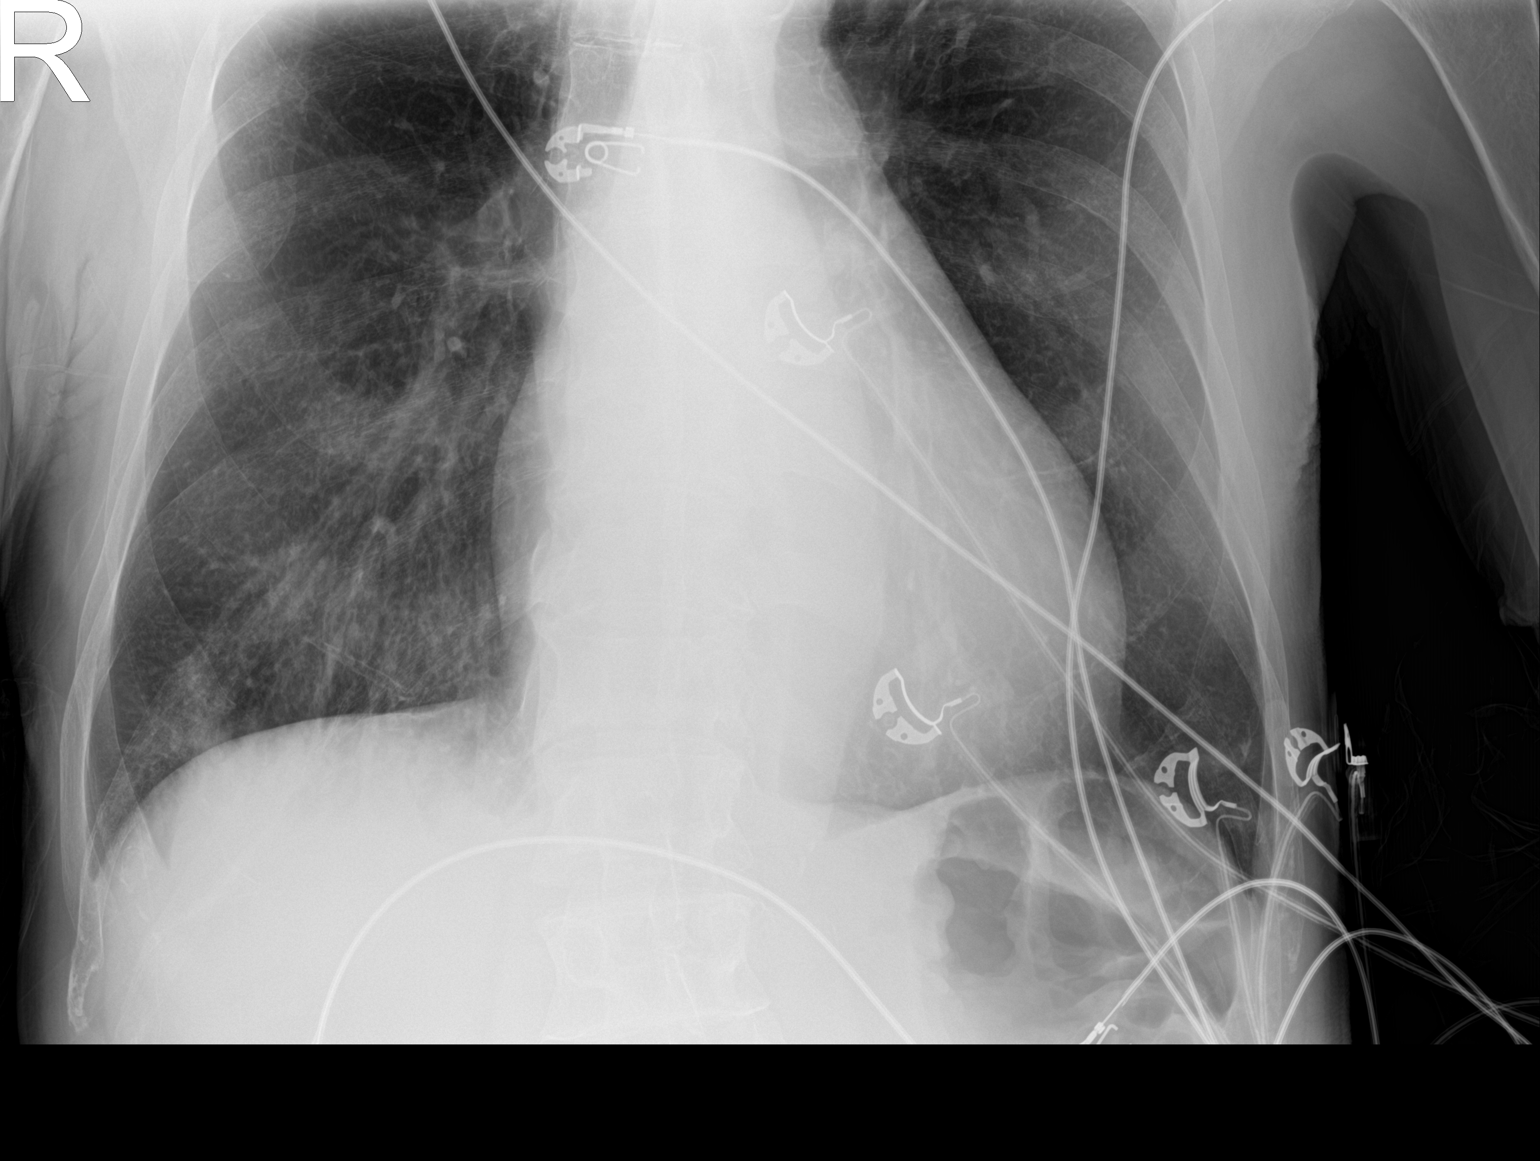

[2 of 2 positions shown; findings below may reference images not displayed]

FINDINGS: There is mild opacity in the right base compared to the left. No
other pulmonary opacities. No pneumothorax. No pulmonary nodules or
masses. Mild cardiomegaly. The hila and mediastinum are normal.
IMPRESSION: Right lower lobe infiltrate.  Recommend follow-up to resolution.
